# Patient Record
Sex: Female | Born: 1995
Health system: Southern US, Community
[De-identification: ages and names within clinical notes are randomized; demographics above are authoritative.]

## PROBLEM LIST (undated history)

## (undated) DIAGNOSIS — Z9221 Personal history of antineoplastic chemotherapy: Secondary | ICD-10-CM

## (undated) DIAGNOSIS — Z923 Personal history of irradiation: Secondary | ICD-10-CM

## (undated) DIAGNOSIS — C50911 Malignant neoplasm of unspecified site of right female breast: Secondary | ICD-10-CM

## (undated) DIAGNOSIS — D649 Anemia, unspecified: Secondary | ICD-10-CM

## (undated) DIAGNOSIS — D573 Sickle-cell trait: Secondary | ICD-10-CM

## (undated) HISTORY — PX: WISDOM TOOTH EXTRACTION: SHX21

## (undated) HISTORY — PX: BREAST LUMPECTOMY: SHX2

---
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
## 2017-12-22 ENCOUNTER — Emergency Department (HOSPITAL_COMMUNITY)
Admission: EM | Admit: 2017-12-22 | Discharge: 2017-12-22 | Disposition: A | Discharge status: Home / Self Care | Payer: BLUE CROSS/BLUE SHIELD | Attending: Emergency Medicine | Admitting: Emergency Medicine

## 2017-12-22 ENCOUNTER — Encounter (HOSPITAL_COMMUNITY): Payer: Self-pay | Admitting: *Deleted

## 2017-12-22 DIAGNOSIS — N63 Unspecified lump in unspecified breast: Secondary | ICD-10-CM

## 2017-12-22 NOTE — ED Notes (Signed)
Declined W/C at D/C and was escorted to lobby by RN. 

## 2017-12-22 NOTE — ED Provider Notes (Signed)
[facility] EMERGENCY DEPARTMENT Provider Note   CSN: [id number] Arrival date & time: 12/22/17  0747     History   Chief Complaint Chief Complaint  Patient presents with  . Breast Mass    HPI Kim Griffin is a 22 y.o. female.  HPI  [name] is a 22 year old female with no significant past medical history who presents to the emergency department for evaluation of right breast mass.  Patient states that she noticed the mass at the 3 o'clock position 2 days ago.  States that it is tender to the touch.  Reports her pain is 2/10 in severity and described as "sore."  Has tried placing heat over the breast tissue with some relief.  Has never had a breast lump before.  She is not on her menstrual cycle.  Denies fever, chills, night sweats, unexpected weight changes, breast redness, nipple discharge.  She does not have a PCP or OB/GYN.  History reviewed. No pertinent past medical history.  There are no active problems to display for this patient.   History reviewed. No pertinent surgical history.  OB History    No data available       Home Medications    Prior to Admission medications   Not on File    Family History History reviewed. No pertinent family history.  Social History Social History   Tobacco Use  . Smoking status: Not on file  Substance Use Topics  . Alcohol use: Not on file  . Drug use: Not on file     Allergies   Patient has no known allergies.   Review of Systems Review of Systems  Constitutional: Negative for chills, fever and unexpected weight change.  Respiratory: Negative for shortness of breath.   Gastrointestinal: Negative for abdominal pain, nausea and vomiting.  Genitourinary: Negative for vaginal bleeding.  Musculoskeletal: Positive for myalgias (right breast pain).  Skin: Negative for color change and rash.     Physical Exam Updated Vital Signs BP (!) 146/87 (BP Location: Right Arm)   Pulse 91   Temp 98.2  F (36.8 C) (Oral)   Resp 16   LMP 12/18/2017 (Exact Date)   SpO2 95%   Physical Exam  Constitutional: She is oriented to person, place, and time. She appears well-developed and well-nourished. No distress.  HENT:  Head: Normocephalic and atraumatic.  Eyes: Right eye exhibits no discharge. Left eye exhibits no discharge.  Cardiovascular: Normal rate, regular rhythm and intact distal pulses. Exam reveals no friction rub.  No murmur heard. Pulmonary/Chest: Effort normal and breath sounds normal. No stridor. No respiratory distress. She has no wheezes. She has no rales. Right breast exhibits no inverted nipple, no nipple discharge and no skin change. Left breast exhibits no inverted nipple, no nipple discharge and no skin change.  Breasts appear symmetric. Approximately 2cm in diameter, hard, well circumscribed mass at the two o'clock position of the right breast. It is mildly tender to the touch. No erythema, warmth or induration on the overlying skin. No nipple discharge. See picture below.     Abdominal: Soft. Bowel sounds are normal. There is no tenderness. There is no guarding.  Neurological: She is alert and oriented to person, place, and time. Coordination normal.  Skin: Skin is warm and dry. She is not diaphoretic.  Psychiatric: She has a normal mood and affect. Her behavior is normal.  Nursing note and vitals reviewed.    ED Treatments / Results  Labs (all labs ordered are  listed, but only abnormal results are displayed) Labs Reviewed - No data to display  EKG  EKG Interpretation None       Radiology No results found.  Procedures Procedures (including critical care time)  Medications Ordered in ED Medications - No data to display   Initial Impression / Assessment and Plan / ED Course  I have reviewed the triage vital signs and the nursing notes.  Pertinent labs & imaging results that were available during my care of the patient were reviewed by me and  considered in my medical decision making (see chart for details).     Patient presents with right breast mass.  She is afebrile and nontoxic-appearing.  Vital signs stable.  On exam, well circumscribed mass palpated at the 2 o'clock position of the right breast.  It is mildly tender to palpation.  No erythema, warmth or induration on the skin to suggest mastitis or abscess. Differential includes breast cyst, fibroadenoma, malignancy.  Have given patient information to follow-up with [facility] outpatient clinic for further evaluation and workup.  Have counseled patient on use of NSAIDs for pain.  Have discussed return precautions with patient and her grandmother at bedside.  They agree and voiced understanding to the above plan.  Final Clinical Impressions(s) / ED Diagnoses   Final diagnoses:  Breast mass    ED Discharge Orders    None       Bernarda Caffey 12/22/17 0944    Nat Christen, MD 12/23/17 [phone]

## 2017-12-22 NOTE — Discharge Instructions (Signed)
Please schedule an appointment with [facility] outpatient clinic for further evaluation of your breast lump.    I have listed the information below, it is highlighted.  You may take 800 mg ibuprofen every 6 hours as needed for breast pain.  Return to the emergency department if you have redness on the skin, fever greater than 100.4 F or have any new or worsening symptoms.

## 2017-12-22 NOTE — ED Triage Notes (Signed)
Pt in c/o mass to her right breast that she first noted a few days ago, area is tender to touch, denies heat to area or redness, denies nipple discharge, denies history of same

## 2017-12-29 ENCOUNTER — Emergency Department (HOSPITAL_COMMUNITY)
Admission: EM | Admit: 2017-12-29 | Discharge: 2017-12-29 | Disposition: A | Discharge status: Home / Self Care | Payer: BLUE CROSS/BLUE SHIELD | Attending: Emergency Medicine | Admitting: Emergency Medicine

## 2017-12-29 ENCOUNTER — Encounter (HOSPITAL_COMMUNITY): Payer: Self-pay

## 2017-12-29 ENCOUNTER — Other Ambulatory Visit: Payer: Self-pay

## 2017-12-29 DIAGNOSIS — C50911 Malignant neoplasm of unspecified site of right female breast: Secondary | ICD-10-CM

## 2017-12-29 DIAGNOSIS — N6310 Unspecified lump in the right breast, unspecified quadrant: Secondary | ICD-10-CM | POA: Diagnosis not present

## 2017-12-29 DIAGNOSIS — F410 Panic disorder [episodic paroxysmal anxiety] without agoraphobia: Secondary | ICD-10-CM | POA: Diagnosis not present

## 2017-12-29 DIAGNOSIS — N63 Unspecified lump in unspecified breast: Secondary | ICD-10-CM

## 2017-12-29 DIAGNOSIS — R079 Chest pain, unspecified: Secondary | ICD-10-CM | POA: Diagnosis present

## 2017-12-29 DIAGNOSIS — R0789 Other chest pain: Secondary | ICD-10-CM | POA: Diagnosis not present

## 2017-12-29 HISTORY — DX: Malignant neoplasm of unspecified site of right female breast: C50.911

## 2017-12-29 NOTE — Discharge Instructions (Signed)
Return for fever, productive cough, passing out, or any other worsening symptoms.  Otherwise, please keep your appointment with [facility].

## 2017-12-29 NOTE — ED Triage Notes (Signed)
Per EMS- Patient was diagnosed with a right breast cyst and is scheduled for surgery to have the cyst drained next wek. Patient was at work today and reached up to put something on a rack and had increased pain to the right breast area.

## 2017-12-29 NOTE — ED Provider Notes (Signed)
[facility] DEPT Provider Note   CSN: [id number] Arrival date & time: 12/29/17  1656     History   Chief Complaint Chief Complaint  Patient presents with  . Chest Pain  . rt breast cyst    HPI Kim Griffin is a 22 y.o. female.  Patient presents to the ED with a chief complaint of chest pain.  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  She states that she has a breast cyst on her right breast and has an appointment to have this evaluated at [facility] on 3/5 next week.  She states that she has been quite concerned about this because she has a family history of breast cancer.  She states that she noticed the cyst 2 weeks ago.  She states that it has not changed in size.  She denies fevers, cough, redness of skin, or nipple discharge.  Denies any history of PE/DVT.  She states that after having the sharp episode of CP today, she felt flushed, anxious, and numb all over.  She feels better now.   The history is provided by the patient. No language interpreter was used.    Past Medical History:  Diagnosis Date  . Breast cyst, right     There are no active problems to display for this patient.   No past surgical history on file.  OB History    No data available       Home Medications    Prior to Admission medications   Medication Sig Start Date End Date Taking? Authorizing Provider  amoxicillin-clavulanate (AUGMENTIN) 875-125 MG tablet Take 1 tablet by mouth 2 (two) times daily.   Yes [provider]  Ferrous Fumarate (HEMOCYTE - 106 MG FE) 324 (106 Fe) MG TABS tablet Take 1 tablet by mouth daily.   Yes [provider]  ibuprofen (ADVIL,MOTRIN) 800 MG tablet Take 800 mg by mouth every 8 (eight) hours as needed for mild pain or moderate pain.   Yes [provider]    Family History No family history on file.  Social History Social  History   Tobacco Use  . Smoking status: Not on file  Substance Use Topics  . Alcohol use: Not on file  . Drug use: Not on file     Allergies   Patient has no known allergies.   Review of Systems Review of Systems  All other systems reviewed and are negative.    Physical Exam Updated Vital Signs BP 128/84 (BP Location: Right Arm)   Pulse 84   Resp 17   Ht 5' (1.524 m)   Wt 55.3 kg (122 lb)   LMP 12/18/2017 (Exact Date)   SpO2 100%   BMI 23.83 kg/m   Physical Exam  Constitutional: She is oriented to person, place, and time. She appears well-developed and well-nourished.  HENT:  Head: Normocephalic and atraumatic.  Eyes: Conjunctivae and EOM are normal. Pupils are equal, round, and reactive to light.  Neck: Normal range of motion. Neck supple.  Cardiovascular: Normal rate and regular rhythm. Exam reveals no gallop and no friction rub.  No murmur heard. Chaperone present for breast exam 2x2 cm breast nodule located at the 2 o'clock position, without retraction of skin, color change, nipple discharge, or induration, no palpable axillary lymph nodes  Pulmonary/Chest: Effort normal and breath sounds normal. No respiratory distress. She has no wheezes. She has no rales. She exhibits  no tenderness.  CTAB  Abdominal: Soft. Bowel sounds are normal. She exhibits no distension and no mass. There is no tenderness. There is no rebound and no guarding.  Musculoskeletal: Normal range of motion. She exhibits no edema or tenderness.  Neurological: She is alert and oriented to person, place, and time.  Skin: Skin is warm and dry.  Psychiatric: She has a normal mood and affect. Her behavior is normal. Judgment and thought content normal.  Nursing note and vitals reviewed.    ED Treatments / Results  Labs (all labs ordered are listed, but only abnormal results are displayed) Labs Reviewed - No data to display  EKG  EKG Interpretation  Date/Time:  Friday December 29 2017 22:04:58  EST Ventricular Rate:  65 PR Interval:    QRS Duration: 70 QT Interval:  365 QTC Calculation: 380 R Axis:   65 Text Interpretation:  Sinus or ectopic atrial rhythm Confirmed by Julianne Rice [phone] on 12/29/2017 11:12:44 PM       Radiology No results found.  Procedures Procedures (including critical care time)  Medications Ordered in ED Medications - No data to display   Initial Impression / Assessment and Plan / ED Course  I have reviewed the triage vital signs and the nursing notes.  Pertinent labs & imaging results that were available during my care of the patient were reviewed by me and considered in my medical decision making (see chart for details).     Patient with chest pain that was brief and sharp today.  Has a cyst that she is going to have evaluated next week at [facility].  She has no evidence of abscess.  She is afebrile.  There are no overlying skin changes.  I believe that the flushed sensation, anxiety, and numbness, she experienced was likely panic/anxiety attack.  Her VSS.  She is not hypoxic nor tachycardic.  No cough.  Doubt utility of CXR.  I personally measured patient's temp 97.5.   I stressed the importance of keeping her follow-up appointment in 4 days.  Final Clinical Impressions(s) / ED Diagnoses   Final diagnoses:  Panic attack  Breast nodule    ED Discharge Orders    None       Montine Circle, PA-C 12/29/17 2316    Julianne Rice, MD 12/30/17 [phone]

## 2017-12-29 NOTE — ED Notes (Signed)
EKG given to EDP,Yelver[place],MD. For review. 

## 2017-12-29 NOTE — ED Triage Notes (Signed)
Pt is c/o rt sided chest pain that radiates to mid chest pt radiates painas 5/10 and sharp pt reports being SOB. Pt reports that symptoms began over a 1 week and a half

## 2017-12-29 NOTE — ED Notes (Signed)
Bed: [facility]1 Expected date:  Expected time:  Means of arrival:  Comments: 

## 2018-01-02 ENCOUNTER — Ambulatory Visit (INDEPENDENT_AMBULATORY_CARE_PROVIDER_SITE_OTHER): Payer: BLUE CROSS/BLUE SHIELD | Admitting: Obstetrics & Gynecology

## 2018-01-02 ENCOUNTER — Encounter: Payer: Self-pay | Admitting: Obstetrics & Gynecology

## 2018-01-02 VITALS — BP 118/66 | HR 78 | Ht 60.25 in | Wt 125.4 lb

## 2018-01-02 DIAGNOSIS — N6310 Unspecified lump in the right breast, unspecified quadrant: Secondary | ICD-10-CM

## 2018-01-02 DIAGNOSIS — Z0000 Encounter for general adult medical examination without abnormal findings: Secondary | ICD-10-CM

## 2018-01-02 NOTE — Progress Notes (Signed)
Patient ID: Kim Griffin, female   DOB: [birth year], 22 y.o.   MRN: [id number]  No chief complaint on file.   HPI Kim Griffin is a 22 y.o. female. HPI She is here for a new onset right breast mass. She noted it about 2 weeks ago. She was seen by her mom's primary care doctor who felt that there was "inflamation" there and prescribed augmentin for the breast mass. She tells me that a "great great GM" had breast cancer.  Past Medical History:  Diagnosis Date  . Breast cyst, right     No past surgical history on file.  No family history on file.  Social History Social History   Tobacco Use  . Smoking status: Never Smoker  . Smokeless tobacco: Never Used  Substance Use Topics  . Alcohol use: No    Frequency: Never  . Drug use: No    No Known Allergies  Current Outpatient Medications  Medication Sig Dispense Refill  . amoxicillin-clavulanate (AUGMENTIN) 875-125 MG tablet Take 1 tablet by mouth 2 (two) times daily.    . Ferrous Fumarate (HEMOCYTE - 106 MG FE) 324 (106 Fe) MG TABS tablet Take 1 tablet by mouth daily.    Marland Kitchen ibuprofen (ADVIL,MOTRIN) 800 MG tablet Take 800 mg by mouth every 8 (eight) hours as needed for mild pain or moderate pain.     No current facility-administered medications for this visit.     Review of Systems Review of Systems She is a sophomore at [employer] She is a virgin, no boyfriend now.  Blood pressure 118/66, pulse 78, height 5' 0.25" (1.53 m), weight 125 lb 6.4 oz (56.9 kg), last menstrual period 12/18/2017.  Physical Exam Physical Exam Breathing, conversing, and ambulating normally Well nourished, well hydrated Black female, no apparent distress Normal left breast Right breast with 3-4 cm firm minimally mobile breast mass just to the right of the nipple (at the 12-3 o'clock position)  Data Reviewed n/a  Assessment    Right breast mass    Plan    Breast ultrasound  Refer to gen surgery  I was unable to do a pap smear today due  to her very tiny introitus. I will have her schedule another appt for an annual exam in a few months       Idaliz Tinkle C Lis Savitt 01/02/2018, 11:06 AM

## 2018-01-03 ENCOUNTER — Telehealth: Payer: Self-pay | Admitting: *Deleted

## 2018-01-10 ENCOUNTER — Ambulatory Visit
Admission: RE | Admit: 2018-01-10 | Discharge: 2018-01-10 | Disposition: A | Discharge status: Home / Self Care | Payer: BLUE CROSS/BLUE SHIELD | Source: Ambulatory Visit | Attending: Obstetrics & Gynecology | Admitting: Obstetrics & Gynecology

## 2018-01-10 ENCOUNTER — Other Ambulatory Visit: Payer: Self-pay | Admitting: Obstetrics & Gynecology

## 2018-01-10 ENCOUNTER — Other Ambulatory Visit: Payer: Self-pay | Admitting: *Deleted

## 2018-01-10 DIAGNOSIS — N6310 Unspecified lump in the right breast, unspecified quadrant: Secondary | ICD-10-CM

## 2018-01-10 DIAGNOSIS — R599 Enlarged lymph nodes, unspecified: Secondary | ICD-10-CM

## 2018-01-11 ENCOUNTER — Telehealth: Payer: Self-pay | Admitting: Hematology and Oncology

## 2018-01-11 ENCOUNTER — Encounter: Payer: Self-pay | Admitting: *Deleted

## 2018-01-11 NOTE — Telephone Encounter (Signed)
Spoke to patient to confirm morning [facility] appointment for 01/17/18, will email packet to the patient

## 2018-01-12 ENCOUNTER — Other Ambulatory Visit: Payer: BLUE CROSS/BLUE SHIELD

## 2018-01-16 ENCOUNTER — Other Ambulatory Visit: Payer: Self-pay | Admitting: *Deleted

## 2018-01-16 DIAGNOSIS — C50211 Malignant neoplasm of upper-inner quadrant of right female breast: Secondary | ICD-10-CM

## 2018-01-16 DIAGNOSIS — Z170 Estrogen receptor positive status [ER+]: Principal | ICD-10-CM

## 2018-01-17 ENCOUNTER — Encounter: Payer: Self-pay | Admitting: Hematology and Oncology

## 2018-01-17 ENCOUNTER — Inpatient Hospital Stay: Payer: BLUE CROSS/BLUE SHIELD

## 2018-01-17 ENCOUNTER — Ambulatory Visit
Admission: RE | Admit: 2018-01-17 | Discharge: 2018-01-17 | Disposition: A | Discharge status: Home / Self Care | Payer: BLUE CROSS/BLUE SHIELD | Source: Ambulatory Visit | Attending: Radiation Oncology | Admitting: Radiation Oncology

## 2018-01-17 ENCOUNTER — Other Ambulatory Visit: Payer: Self-pay | Admitting: *Deleted

## 2018-01-17 ENCOUNTER — Telehealth: Payer: Self-pay | Admitting: Hematology and Oncology

## 2018-01-17 ENCOUNTER — Inpatient Hospital Stay: Payer: BLUE CROSS/BLUE SHIELD | Attending: Hematology and Oncology | Admitting: Hematology and Oncology

## 2018-01-17 ENCOUNTER — Ambulatory Visit: Payer: Self-pay | Admitting: Surgery

## 2018-01-17 ENCOUNTER — Ambulatory Visit: Payer: BLUE CROSS/BLUE SHIELD | Attending: Surgery | Admitting: Physical Therapy

## 2018-01-17 ENCOUNTER — Encounter: Payer: Self-pay | Admitting: Physical Therapy

## 2018-01-17 ENCOUNTER — Other Ambulatory Visit: Payer: Self-pay

## 2018-01-17 ENCOUNTER — Encounter: Payer: Self-pay | Admitting: *Deleted

## 2018-01-17 DIAGNOSIS — Z170 Estrogen receptor positive status [ER+]: Principal | ICD-10-CM

## 2018-01-17 DIAGNOSIS — C50211 Malignant neoplasm of upper-inner quadrant of right female breast: Secondary | ICD-10-CM | POA: Insufficient documentation

## 2018-01-17 DIAGNOSIS — Z803 Family history of malignant neoplasm of breast: Secondary | ICD-10-CM

## 2018-01-17 LAB — CMP (CANCER CENTER ONLY)
ALT: 14 U/L (ref 0–55)
AST: 15 U/L (ref 5–34)
Albumin: 3.9 g/dL (ref 3.5–5.0)
Alkaline Phosphatase: 70 U/L (ref 40–150)
Anion gap: 10 (ref 3–11)
BUN: 11 mg/dL (ref 7–26)
CHLORIDE: 103 mmol/L (ref 98–109)
CO2: 28 mmol/L (ref 22–29)
CREATININE: 0.81 mg/dL (ref 0.60–1.10)
Calcium: 9.8 mg/dL (ref 8.4–10.4)
GFR, Est AFR Am: >60 mL/min (ref >60)
GFR, Estimated: >60 mL/min (ref >60)
Glucose, Bld: 102 mg/dL (ref 70–140)
Potassium: 4.2 mmol/L (ref 3.5–5.1)
SODIUM: 141 mmol/L (ref 136–145)
Total Bilirubin: 0.2 mg/dL (ref 0.2–1.2)
Total Protein: 8.1 g/dL (ref 6.4–8.3)

## 2018-01-17 LAB — CBC WITH DIFFERENTIAL (CANCER CENTER ONLY)
Basophils Absolute: 0 10*3/uL (ref 0.0–0.1)
Basophils Relative: 1 %
EOS ABS: 0.1 10*3/uL (ref 0.0–0.5)
EOS PCT: 2 %
HCT: 35.8 % (ref 34.8–46.6)
HEMOGLOBIN: 11.6 g/dL (ref 11.6–15.9)
LYMPHS ABS: 1.6 10*3/uL (ref 0.9–3.3)
LYMPHS PCT: 37 %
MCH: 21.7 pg — AB (ref 25.1–34.0)
MCHC: 32.4 g/dL (ref 31.5–36.0)
MCV: 67.1 fL — AB (ref 79.5–101.0)
MONOS PCT: 9 %
Monocytes Absolute: 0.4 10*3/uL (ref 0.1–0.9)
Neutro Abs: 2.2 10*3/uL (ref 1.5–6.5)
Neutrophils Relative %: 51 %
PLATELETS: 306 10*3/uL (ref 145–400)
RBC: 5.34 MIL/uL (ref 3.70–5.45)
RDW: 16 % — ABNORMAL HIGH (ref 11.2–14.5)
WBC Count: 4.3 10*3/uL (ref 3.9–10.3)

## 2018-01-17 NOTE — Progress Notes (Signed)
Clinical Social Work [facility] Psychosocial Distress Screening [place]  Patient completed distress screening protocol and scored a 7 on the Psychosocial Distress Thermometer which indicates moderate distress. Clinical Social Worker met with patient and patients family in [facility] to assess for distress and other psychosocial needs. Patient stated she was feeling overwhelmed but felt "better" after meeting with the treatment team and getting more information on her treatment plan. CSW and patient discussed common feeling and emotions when being diagnosed with cancer, and the importance of support during treatment. CSW informed patient of the support team and support services at [facility].  CSW will follow up with patient at her first treatment to review resources and offer additional support.  CSW provided contact information and encouraged patient to call with any questions or concerns.  ONCBCN DISTRESS SCREENING 01/17/2018  Screening Type Initial Screening  Distress experienced in past week (1-10) 7  Emotional problem type Nervousness/Anxiety;Adjusting to illness  Physical Problem type Pain;Breathing  Physician notified of physical symptoms Yes     Johnnye Lana, MSW, LCSW, OSW-C Clinical Social Worker [facility] [phone]

## 2018-01-17 NOTE — Progress Notes (Signed)
[facility] CONSULT NOTE  Patient Care Team: Patient, No Pcp Per as PCP - General (General Practice) Nicholas Lose, MD as Consulting Physician (Hematology and Oncology) Alphonsa Overall, MD as Consulting Physician (General Surgery) Gery Pray, MD as Consulting Physician (Radiation Oncology)  CHIEF COMPLAINTS/PURPOSE OF CONSULTATION:  Newly diagnosed breast cancer  HISTORY OF PRESENTING ILLNESS:  Kim Griffin 22 y.o. female is here because of recent diagnosis of  Rt breast Cancer.  Patient felt a lump in the right breast and immediately went to the emergency room.  They thought this must have been a cyst.  She then subsequently underwent a mammogram that revealed a 2.6 cm tumor in the medial right breast with one abnormal lymph node.  Ultrasound-guided biopsy of the mass and the lymph node revealed grade 3 invasive ductal carcinoma that was ER 90% PR 2% Ki-67 70% HER-2 negative.  She was presented this morning in the multidisciplinary tumor board and she is here today accompanied by her family to discuss her treatment plan. Patient is currently working at [employer] and has not been married and does not have any children.  I reviewed her records extensively and collaborated the history with the patient.  SUMMARY OF ONCOLOGIC HISTORY:   Malignant neoplasm of upper-inner quadrant of right breast in female, estrogen receptor positive ([place])   01/10/2018 Initial Diagnosis    Palpable right breast mass with calcifications medial right breast at 1 o'clock position: 2.6 cm by ultrasound, 1 abnormal lymph node: Ultrasound biopsy of both the mass and the lymph node revealed grade 3 invasive ductal carcinoma ER 90%, PR 2%, Ki-67 70%, HER-2 negative ratio 0.96, T2 N1 stage II a AJCC 8 clinical stage      MEDICAL HISTORY:  Past Medical History:  Diagnosis Date  . Breast cyst, right     SURGICAL HISTORY: History reviewed. No pertinent surgical history.  SOCIAL HISTORY: Social  History   Socioeconomic History  . Marital status: Single    Spouse name: Not on file  . Number of children: Not on file  . Years of education: Not on file  . Highest education level: Not on file  Social Needs  . Financial resource strain: Not on file  . Food insecurity - worry: Not on file  . Food insecurity - inability: Not on file  . Transportation needs - medical: Not on file  . Transportation needs - non-medical: Not on file  Occupational History  . Not on file  Tobacco Use  . Smoking status: Never Smoker  . Smokeless tobacco: Never Used  Substance and Sexual Activity  . Alcohol use: No    Frequency: Never  . Drug use: No  . Sexual activity: Not on file  Other Topics Concern  . Not on file  Social History Narrative  . Not on file    FAMILY HISTORY: Family History  Problem Relation Age of Onset  . Breast cancer Cousin   . Lung cancer Maternal Uncle   . Lung cancer Maternal Uncle     ALLERGIES:  has No Known Allergies.  MEDICATIONS:  Current Outpatient Medications  Medication Sig Dispense Refill  . Ferrous Fumarate (HEMOCYTE - 106 MG FE) 324 (106 Fe) MG TABS tablet Take 1 tablet by mouth daily.    Marland Kitchen ibuprofen (ADVIL,MOTRIN) 800 MG tablet Take 800 mg by mouth every 8 (eight) hours as needed for mild pain or moderate pain.    Marland Kitchen amoxicillin-clavulanate (AUGMENTIN) 875-125 MG tablet Take 1 tablet by mouth 2 (  two) times daily.     No current facility-administered medications for this visit.     REVIEW OF SYSTEMS:   Constitutional: Denies fevers, chills or abnormal night sweats Eyes: Denies blurriness of vision, double vision or watery eyes Ears, nose, mouth, throat, and face: Denies mucositis or sore throat Respiratory: Denies cough, dyspnea or wheezes Cardiovascular: Denies palpitation, chest discomfort or lower extremity swelling Gastrointestinal:  Denies nausea, heartburn or change in bowel habits Skin: Denies abnormal skin rashes Lymphatics: Denies new  lymphadenopathy or easy bruising Neurological:Denies numbness, tingling or new weaknesses Behavioral/Psych: Mood is stable, no new changes  Breast: Lump is palpable in the right breast All other systems were reviewed with the patient and are negative.  PHYSICAL EXAMINATION: ECOG PERFORMANCE STATUS: 1 - Symptomatic but completely ambulatory  Vitals:   01/17/18 0910  BP: 113/74  Pulse: 84  Resp: 18  Temp: 98.3 F (36.8 C)  SpO2: 100%   Filed Weights   01/17/18 0910  Weight: 125 lb 4.8 oz (56.8 kg)    GENERAL:alert, no distress and comfortable SKIN: skin color, texture, turgor are normal, no rashes or significant lesions EYES: normal, conjunctiva are pink and non-injected, sclera clear OROPHARYNX:no exudate, no erythema and lips, buccal mucosa, and tongue normal  NECK: supple, thyroid normal size, non-tender, without nodularity LYMPH:  no palpable lymphadenopathy in the cervical, axillary or inguinal LUNGS: clear to auscultation and percussion with normal breathing effort HEART: regular rate & rhythm and no murmurs and no lower extremity edema ABDOMEN:abdomen soft, non-tender and normal bowel sounds Musculoskeletal:no cyanosis of digits and no clubbing  PSYCH: alert & oriented x 3 with fluent speech NEURO: no focal motor/sensory deficits BREAST: Palpable lump in the right breast. No palpable axillary or supraclavicular lymphadenopathy (exam performed in the presence of a chaperone)   LABORATORY DATA:  I have reviewed the data as listed Lab Results  Component Value Date   WBC 4.3 01/17/2018   HGB 13.1 01/13/2011   HCT 35.8 01/17/2018   MCV 67.1 (L) 01/17/2018   PLT 306 01/17/2018   Lab Results  Component Value Date   NA 141 01/17/2018   K 4.2 01/17/2018   CL 103 01/17/2018   CO2 28 01/17/2018    RADIOGRAPHIC STUDIES: I have personally reviewed the radiological reports and agreed with the findings in the report.  ASSESSMENT AND PLAN:  Malignant neoplasm of  upper-inner quadrant of right breast in female, estrogen receptor positive ([place]) 01/10/2018:Palpable right breast mass with calcifications medial right breast at 1 o'clock position: 2.6 cm by ultrasound, 1 abnormal lymph node: Ultrasound biopsy of both the mass and the lymph node revealed grade 3 invasive ductal carcinoma ER 90%, PR 2%, Ki-67 70%, HER-2 negative ratio 0.96, T2 N1 stage II a AJCC 8 clinical stage  Pathology and radiology counseling: Discussed with the patient, the details of pathology including the type of breast cancer,the clinical staging, the significance of ER, PR and HER-2/neu receptors and the implications for treatment. After reviewing the pathology in detail, we proceeded to discuss the different treatment options between surgery, radiation, chemotherapy, antiestrogen therapies.  Recommendation: 1 Genetic testing.  Staging scans 2. neoadjuvant chemotherapy with dose dense Adriamycin and Cytoxan followed by Taxol weekly x12 3.  Followed by surgery depending on genetic test results and patient's preference 4.  Followed by adjuvant radiation 5.  Followed by antiestrogen therapy and evaluation on Natalee clinical trial  Fertility preservation will be arranged through [facility]. I will discussed with the fertility clinic  whether we can initiate tamoxifen therapy along with Zoladex while she is undergoing fertility preservation.  Return to clinic to start neoadjuvant chemotherapy  All questions were answered. The patient knows to call the clinic with any problems, questions or concerns.    Harriette Ohara, MD 01/17/18

## 2018-01-17 NOTE — Telephone Encounter (Signed)
Per 3/20 already done

## 2018-01-17 NOTE — Progress Notes (Signed)
Nutrition Assessment  Reason for Assessment:  Pt seen in Breast Clinic  ASSESSMENT:   22 year old female with new diagnosis of breast cancer.    Patient reports normal appetite.    Medications:  reviewed  Labs: reviewed  Anthropometrics:   Height: 60.25 inches Weight: 125 lb 6.4 oz BMI: 24   NUTRITION DIAGNOSIS: Food and nutrition related knowledge deficit related to new diagnosis of breast cancer as evidenced by no prior need for nutrition related information.  INTERVENTION:   Discussed and provided packet of information regarding nutritional tips for breast cancer patients.  Questions answered.  Teachback method used.  Contact information provided and patient knows to contact me with questions/concerns.    MONITORING, EVALUATION, and GOAL: Pt will consume a healthy plant based diet to maintain lean body mass throughout treatment.   Merilee Wible B. Zenia Resides, [place], [place] Registered Dietitian [phone] (pager)

## 2018-01-17 NOTE — Patient Instructions (Signed)

## 2018-01-17 NOTE — Therapy (Signed)
[place], [place], [address] Phone: [phone]   Fax:  [phone]  Physical Therapy Evaluation  Patient Details  Name: Kim Griffin MRN: [id number] Date of Birth: [birth year] Referring Provider: Dr. Alphonsa Overall   Encounter Date: 01/17/2018  PT End of Session - 01/17/18 1110    Visit Number  1    Number of Visits  1    PT Start Time  1000    PT Stop Time  1024    PT Time Calculation (min)  24 min    Activity Tolerance  Patient tolerated treatment well    Behavior During Therapy  [facility] for tasks assessed/performed       Past Medical History:  Diagnosis Date  . Breast cyst, right     History reviewed. No pertinent surgical history.  There were no vitals filed for this visit.   Subjective Assessment - 01/17/18 1048    Subjective  Patient reports she is here to be seen by her medical team for her newly diagnosed right breast cancer.    Patient is accompained by:  Family member    Pertinent History  Patient was diagnosed on 01/11/18 with right invasive ductal carcinoma breast cancer. It measures 2.6 cm and is located in the upper inner quadrant. It is ER/PR positive and HER2 negative with a Ki67 of 70%. She has a known axillary positive lymph node.    Patient Stated Goals  Reduce lymphedema risk and learn post op shoulder ROM HEP    Currently in Pain?  Yes    Pain Score  2     Pain Location  Breast    Pain Orientation  Right    Pain Descriptors / Indicators  Aching    Pain Type  Acute pain    Pain Onset  In the past 7 days    Pain Frequency  Intermittent    Aggravating Factors   Nothing    Pain Relieving Factors  Nothing    Multiple Pain Sites  No         [facility] PT Assessment - 01/17/18 0001      Assessment   Medical Diagnosis  Right breast cancer    Referring Provider  Dr. Alphonsa Overall    Onset Date/Surgical Date  01/11/18    Hand Dominance  Right    Prior Therapy  none      Precautions   Precautions  Other (comment)    Precaution Comments  active cancer      Restrictions   Weight Bearing Restrictions  No      Balance Screen   Has the patient fallen in the past 6 months  No    Has the patient had a decrease in activity level because of a fear of falling?   No    Is the patient reluctant to leave their home because of a fear of falling?   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    Leisure  She does not exercise      Cognition   Overall Cognitive Status  Within Functional Limits for tasks assessed      Posture/Postural  Control   Posture/Postural Control  No significant limitations      ROM / Strength   AROM / PROM / Strength  AROM;Strength      AROM   AROM Assessment Site  Shoulder;Cervical    Right/Left Shoulder  Right;Left    Right Shoulder Extension  35 Degrees    Right Shoulder Flexion  155 Degrees    Right Shoulder ABduction  157 Degrees    Right Shoulder Internal Rotation  60 Degrees    Right Shoulder External Rotation  83 Degrees    Left Shoulder Extension  46 Degrees    Left Shoulder Flexion  152 Degrees    Left Shoulder ABduction  170 Degrees    Left Shoulder Internal Rotation  69 Degrees    Left Shoulder External Rotation  85 Degrees    Cervical Flexion  WNL    Cervical Extension  WNL    Cervical - Right Side Bend  WNL    Cervical - Left Side Bend  WNL    Cervical - Right Rotation  WNL    Cervical - Left Rotation  WNL      Strength   Overall Strength  Within functional limits for tasks performed        LYMPHEDEMA/ONCOLOGY QUESTIONNAIRE - 01/17/18 1107      Type   Cancer Type  Right breast      Lymphedema Assessments   Lymphedema Assessments  Upper extremities       Right Upper Extremity Lymphedema   10 cm Proximal to Olecranon Process  26.6 cm    Olecranon Process  24 cm    10 cm Proximal to Ulnar Styloid Process  20.9 cm    Just Proximal to Ulnar Styloid Process  14.7 cm    Across Hand at [employer]  17.9 cm    At [place] of 2nd Digit  5.8 cm      Left Upper Extremity Lymphedema   10 cm Proximal to Olecranon Process  26.3 cm    Olecranon Process  23.4 cm    10 cm Proximal to Ulnar Styloid Process  20.3 cm    Just Proximal to Ulnar Styloid Process  14.8 cm    Across Hand at [employer]  17.5 cm    At [place] of 2nd Digit  5.7 cm          Objective measurements completed on examination: See above findings.    Patient was instructed today in a home exercise program today for post op shoulder range of motion. These included active assist shoulder flexion in sitting, scapular retraction, wall walking with shoulder abduction, and hands behind head external rotation.  She was encouraged to do these twice a day, holding 3 seconds and repeating 5 times when permitted by her physician.      PT Education - 01/17/18 1108    Education provided  Yes    Education Details  Lymphedema risk reduction and post op shoulder ROM HEP    Person(s) Educated  Patient;Parent(s)    Methods  Explanation;Demonstration;Handout    Comprehension  Returned demonstration;Verbalized understanding           Breast Clinic Goals - 01/17/18 1118      Patient will be able to verbalize understanding of pertinent lymphedema risk reduction practices relevant to her diagnosis specifically related to skin care.   Time  1    Period  Days    Status  Achieved      Patient  will be able to return demonstrate and/or verbalize understanding of the post-op home exercise program related to regaining shoulder range of motion.   Time  1    Period  Days    Status  Achieved      Patient will be able to verbalize understanding of the importance of attending the postoperative  After Breast Cancer Class for further lymphedema risk reduction education and therapeutic exercise.   Time  1    Period  Days    Status  Achieved            Plan - 01/17/18 1111    Clinical Impression Statement  Patient was diagnosed on 01/11/18 with right invasive ductal carcinoma breast cancer. It measures 2.6 cm and is located in the upper inner quadrant. It is ER/PR positive and HER2 negative with a Ki67 of 70%. She has a known axillary positive lymph node. Her multidisciplinary medical team met prior to her assessments to determine a recommended treatment plan. She is planning to have an MRI and genetic testing, neoadjuvant chemotherapy, and then probably a right lumpectomy and targeted axillary node dissection followed by radiation and anti-estrogen therapy. She will benefit from a post op PT visit to reassess and determine needs.    History and Personal Factors relevant to plan of care:  Young age (74), lives with her aunt    Clinical Presentation  Evolving    Clinical Presentation due to:  Unknown extent of disease until staging scans are complete; surgical plan unknown until response to chemo is known; aggressive disease    Clinical Decision Making  Moderate    Rehab Potential  Good    Clinical Impairments Affecting Rehab Potential  Unknown extent of disease    PT Frequency  One time visit    PT Treatment/Interventions  ADLs/Self Care Home Management;Therapeutic exercise;Patient/family education    PT Next Visit Plan  Will f/u after surgery    PT Home Exercise Plan  Post op shoulder ROM HEP    Consulted and Agree with Plan of Care  Patient;Family member/caregiver    Family Member Consulted  Parents and aunt       Patient will benefit from skilled therapeutic intervention in order to improve the following deficits and impairments:  Pain, Decreased knowledge of precautions, Impaired UE functional use, Decreased range of motion  Visit Diagnosis: Malignant neoplasm of upper-inner  quadrant of right breast in female, estrogen receptor positive ([place]) - Plan: PT plan of care cert/re-cert     Problem List Patient Active Problem List   Diagnosis Date Noted  . Malignant neoplasm of upper-inner quadrant of right breast in female, estrogen receptor positive ([place]) 01/16/2018    Annia Friendly, PT 01/17/18 11:21 AM  [place], [place], [address] Phone: [phone]   Fax:  [phone]  Name: LAURENASHLEY VIAR MRN: [id number] Date of Birth: 1996/07/24

## 2018-01-17 NOTE — Progress Notes (Addendum)
Radiation Oncology         [phone] ________________________________  Name: Kim Griffin        MRN: [id number]  Date of Service: 01/17/2018 DOB: 03/30/1996  [id number], No Pcp Per  Alphonsa Overall, MD     REFERRING PHYSICIAN: Alphonsa Overall, MD   DIAGNOSIS: The encounter diagnosis was Malignant neoplasm of upper-inner quadrant of right breast in female, estrogen receptor positive ([place]).   HISTORY OF PRESENT ILLNESS: Kim Griffin is a 22 y.o. female seen in the multidisciplinary breast clinic for a new diagnosis of right breast cancer. The patient presented to the ER on 01/10/18 after palpating a mass in the right breast. Ultrasound of the right breast on 01/10/18 showed an irregular mass within the right breast at the 1:00 position, 1 cm from the nipple, measuring 2.6 cm. This corresponded with the palpable lump. There was a single morphologically abnormal lymph node in the right axilla with cortical thickening measuring 6 mm. Biopsy of the mass on 01/10/18 showed grade III invasive ductal carcinoma with focal necrosis of the right breast at the upper inner quadrant at the 1 o'clock position. Receptor status was ER 90%, PR 2%, Ki67 70%, and Her2 negative. 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    PREVIOUS RADIATION THERAPY: No   PAST MEDICAL HISTORY:  Past Medical History:  Diagnosis Date  . Breast cyst, right        PAST SURGICAL HISTORY:No past surgical history on file.   FAMILY HISTORY:  Family History  Problem Relation Age of Onset  . Breast cancer Cousin   . Lung cancer Maternal Uncle   . Lung cancer Maternal Uncle      SOCIAL HISTORY:  reports that  has never smoked. she has never used smokeless tobacco. She reports that she does not drink alcohol or use drugs. The patient is single and lives in [place]. 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 She's accompanied by her aunt and parents.   ALLERGIES: Patient has no known  allergies.   MEDICATIONS:  Current Outpatient Medications  Medication Sig Dispense Refill  . amoxicillin-clavulanate (AUGMENTIN) 875-125 MG tablet Take 1 tablet by mouth 2 (two) times daily.    . Ferrous Fumarate (HEMOCYTE - 106 MG FE) 324 (106 Fe) MG TABS tablet Take 1 tablet by mouth daily.    Marland Kitchen ibuprofen (ADVIL,MOTRIN) 800 MG tablet Take 800 mg by mouth every 8 (eight) hours as needed for mild pain or moderate pain.     No current facility-administered medications for this encounter.      REVIEW OF SYSTEMS: On review of systems, the patient reports that she is doing well overall. She reports pain to the breast and reports that she has been having a hard time sleeping while lying flat. She denies any chest pain, anxiety, and anemia. She denies any chest pain, shortness of breath, cough, fevers, chills, night sweats, unintended weight changes. She denies any bowel or bladder disturbances, and denies abdominal pain, nausea or vomiting. She reports normal menstrual cycles. She denies any new musculoskeletal or joint aches or pains. A complete review of systems is obtained and is otherwise negative.     PHYSICAL EXAM:  Wt Readings from Last 3 Encounters:  01/17/18 125 lb 4.8 oz (56.8 kg)  01/02/18 125 lb 6.4 oz (56.9 kg)  12/29/17 122 lb (55.3 kg)   Temp Readings from Last 3 Encounters:  01/17/18 98.3 F (36.8 C) (Oral)  12/29/17 Marland Kitchen)  97.5 F (36.4 C) (Oral)  12/22/17 98.2 F (36.8 C) (Oral)   BP Readings from Last 3 Encounters:  01/17/18 113/74  01/02/18 118/66  12/29/17 115/60   Pulse Readings from Last 3 Encounters:  01/17/18 84  01/02/18 78  12/29/17 84     In general this is a well appearing African American female in no acute distress. She is alert and oriented x4 and appropriate throughout the examination. HEENT reveals that the patient is normocephalic, atraumatic. EOMs are intact. PERRLA. Skin is intact without any evidence of gross lesions. Cardiovascular exam  reveals a regular rate and rhythm, no clicks rubs or murmurs are auscultated. Chest is clear to auscultation bilaterally. Lymphatic assessment is performed and does not reveal any adenopathy in the cervical, supraclavicular, axillary, or inguinal chains. Bilateral breast exam is performed and reveals dense breast tissue of bilateral breasts, and post biopsy bandages in the right lateral breast and right axilla. The mass that the patient noted which was biopsied is firm and palpable and measures up to 4-5 cm in greatest dimension possibly related to bruising. No nipple bleeding or discharge is noted of either breast. Abdomen has active bowel sounds in all quadrants and is intact. The abdomen is soft, non tender, non distended. Lower extremities are negative for pretibial pitting edema, deep calf tenderness, cyanosis or clubbing.   ECOG = 1  0 - Asymptomatic (Fully active, able to carry on all predisease activities without restriction)  1 - Symptomatic but completely ambulatory (Restricted in physically strenuous activity but ambulatory and able to carry out work of a light or sedentary nature. For example, light housework, office work)  2 - Symptomatic, <50% in bed during the day (Ambulatory and capable of all self care but unable to carry out any work activities. Up and about more than 50% of waking hours)  3 - Symptomatic, >50% in bed, but not bedbound (Capable of only limited self-care, confined to bed or chair 50% or more of waking hours)  4 - Bedbound (Completely disabled. Cannot carry on any self-care. Totally confined to bed or chair)  5 - Death   Eustace Pen MM, Creech RH, Tormey DC, et al. [phone]). "Toxicity and response criteria of the [facility]". [place] Oncol. 5 (6): 649-55    LABORATORY DATA:  Lab Results  Component Value Date   WBC 4.3 01/17/2018   HGB 13.1 01/13/2011   HCT 35.8 01/17/2018   MCV 67.1 (L) 01/17/2018   PLT 306 01/17/2018   Lab Results    Component Value Date   NA 141 01/17/2018   K 4.2 01/17/2018   CL 103 01/17/2018   CO2 28 01/17/2018   Lab Results  Component Value Date   ALT 14 01/17/2018   AST 15 01/17/2018   ALKPHOS 70 01/17/2018   BILITOT 0.2 01/17/2018      RADIOGRAPHY: US Breast Ltd Uni Right Inc Axilla  Result Date: 01/10/2018 CLINICAL DATA:  22 year old female with a right breast mass. EXAM: DIGITAL DIAGNOSTIC BILATERAL MAMMOGRAM WITH TOMO ULTRASOUND RIGHT BREAST COMPARISON:  None ACR Breast Density Category d: The breast tissue is extremely dense, which lowers the sensitivity of mammography. FINDINGS: Targeted ultrasound is performed, evaluating the upper inner quadrant of the right breast as directed by the patient, showing an irregular hypoechoic mass at the 1 o'clock axis, 1 cm from the nipple, measuring 2.6 x 2.1 x 2.4 cm with associated calcifications centrally, with internal vascularity, corresponding to the palpable lump. Right axilla was  evaluated with ultrasound showing a single morphologically abnormal lymph node with cortical thickness of 6 mm. Bilateral diagnostic mammogram was then obtained to exclude multifocal or contralateral abnormality. Corresponding irregular mass is identified within the inner right breast. No additional dominant masses, suspicious calcifications or secondary signs of malignancy are identified within the right breast. There are no dominant masses, suspicious calcifications or secondary signs of malignancy identified within the left breast. IMPRESSION: 1. Irregular mass within the right breast at the 1 o'clock axis, 1 cm from the nipple, measuring 2.6 cm, corresponding to the palpable lump. This is a suspicious finding for which ultrasound-guided biopsy is recommended. 2. Single morphologically abnormal lymph node in the right axilla, with cortical thickness measuring 6 mm. This is also a suspicious finding for which ultrasound-guided biopsy is recommended. 3. No mammographic evidence  of malignancy within the left breast. RECOMMENDATION: 1. Ultrasound-guided biopsy of the right breast mass at the 1 o'clock axis. 2. Ultrasound-guided biopsy of the morphologically abnormal lymph node in the right axilla. Ultrasound-guided biopsies are scheduled for later today. I have discussed the findings and recommendations with the patient. Results were also provided in writing at the conclusion of the visit. If applicable, a reminder letter will be sent to the patient regarding the next appointment. BI-RADS CATEGORY  4: Suspicious. Electronically Signed   By: Franki Cabot M.D.   On: 01/10/2018 11:08   Mm Diag Breast Tomo Bilateral  Result Date: 01/10/2018 CLINICAL DATA:  22 year old female with a right breast mass. EXAM: DIGITAL DIAGNOSTIC BILATERAL MAMMOGRAM WITH TOMO ULTRASOUND RIGHT BREAST COMPARISON:  None ACR Breast Density Category d: The breast tissue is extremely dense, which lowers the sensitivity of mammography. FINDINGS: Targeted ultrasound is performed, evaluating the upper inner quadrant of the right breast as directed by the patient, showing an irregular hypoechoic mass at the 1 o'clock axis, 1 cm from the nipple, measuring 2.6 x 2.1 x 2.4 cm with associated calcifications centrally, with internal vascularity, corresponding to the palpable lump. Right axilla was evaluated with ultrasound showing a single morphologically abnormal lymph node with cortical thickness of 6 mm. Bilateral diagnostic mammogram was then obtained to exclude multifocal or contralateral abnormality. Corresponding irregular mass is identified within the inner right breast. No additional dominant masses, suspicious calcifications or secondary signs of malignancy are identified within the right breast. There are no dominant masses, suspicious calcifications or secondary signs of malignancy identified within the left breast. IMPRESSION: 1. Irregular mass within the right breast at the 1 o'clock axis, 1 cm from the nipple,  measuring 2.6 cm, corresponding to the palpable lump. This is a suspicious finding for which ultrasound-guided biopsy is recommended. 2. Single morphologically abnormal lymph node in the right axilla, with cortical thickness measuring 6 mm. This is also a suspicious finding for which ultrasound-guided biopsy is recommended. 3. No mammographic evidence of malignancy within the left breast. RECOMMENDATION: 1. Ultrasound-guided biopsy of the right breast mass at the 1 o'clock axis. 2. Ultrasound-guided biopsy of the morphologically abnormal lymph node in the right axilla. Ultrasound-guided biopsies are scheduled for later today. I have discussed the findings and recommendations with the patient. Results were also provided in writing at the conclusion of the visit. If applicable, a reminder letter will be sent to the patient regarding the next appointment. BI-RADS CATEGORY  4: Suspicious. Electronically Signed   By: Franki Cabot M.D.   On: 01/10/2018 11:08   [place] Axillary Node Core Biopsy Right  Addendum Date: 01/11/2018   ADDENDUM REPORT: 01/11/2018  13:03 ADDENDUM: Pathology revealed GRADE III INVASIVE DUCTAL CARCINOMA WITH FOCAL NECROSIS of the Right breast, upper inner, 1 o'clock position. METASTATIC CARCINOMA of the Right axillary lymph node. This was found to be concordant by Dr. Hassan Rowan. Pathology results were discussed with the patient by telephone. The patient reported doing well after the biopsies with tenderness at the sites. Post biopsy instructions and care were reviewed and questions were answered. The patient was encouraged to call [facility] for any additional concerns. The patient was referred to [facility] on January 17, 2018. Recommendation for bilateral breast MRI due to extreme density. Pathology results reported by Terie Purser, RN on 01/11/2018. Electronically Signed   By: Margarette Canada M.D.    On: 01/11/2018 13:03   Result Date: 01/11/2018 CLINICAL DATA:  22 year old female for tissue sampling of suspicious RIGHT breast mass and enlarged RIGHT axillary lymph node. EXAM: ULTRASOUND GUIDED RIGHT BREAST CORE NEEDLE BIOPSY ULTRASOUND-GUIDED BIOPSY OF A RIGHT AXILLARY LYMPH NODE COMPARISON:  Previous exam(s). FINDINGS: I met with the patient and we discussed the procedure of ultrasound-guided biopsies, including benefits and alternatives. We discussed the high likelihood of a successful procedure. We discussed the risks of the procedures, including infection, bleeding, tissue injury, clip migration, and inadequate sampling. Informed written consent was given. The usual time-out protocol was performed immediately prior to the procedures. RIGHT BREAST BIOPSY: Lesion quadrant: UPPER INNER Using sterile technique and 1% Lidocaine as local anesthetic, under direct ultrasound visualization, a 12 gauge spring-loaded device was used to perform biopsy of the 2.6 cm hypoechoic mass at the 1 o'clock position 1 cm from the nipple using a LATERAL approach. At the conclusion of the procedure a spiral HydroMARK tissue marker clip was deployed into the biopsy cavity with placement confirmed sonographically. RIGHT AXILLARY LYMPH NODE BIOPSY: Using sterile technique and 1% Lidocaine as local anesthetic, under direct ultrasound visualization, a 14 gauge spring-loaded device was used to perform biopsy of the enlarged RIGHT axillary lymph node using a LATERAL approach. At the conclusion of the procedure a spiral HydroMARK tissue marker clip was deployed into the biopsy cavity with placement confirmed sonographically. IMPRESSION: Ultrasound guided biopsy of 2.6 cm UPPER INNER RIGHT breast mass and enlarged RIGHT axillary lymph node. No apparent complications. Electronically Signed: By: Margarette Canada M.D. On: 01/10/2018 14:29   [place] Rt Breast Bx W Loc Dev 1st Lesion Img Bx Spec US Guide  Addendum Date: 01/11/2018   ADDENDUM  REPORT: 01/11/2018 13:03 ADDENDUM: Pathology revealed GRADE III INVASIVE DUCTAL CARCINOMA WITH FOCAL NECROSIS of the Right breast, upper inner, 1 o'clock position. METASTATIC CARCINOMA of the Right axillary lymph node. This was found to be concordant by Dr. Hassan Rowan. Pathology results were discussed with the patient by telephone. The patient reported doing well after the biopsies with tenderness at the sites. Post biopsy instructions and care were reviewed and questions were answered. The patient was encouraged to call [facility] for any additional concerns. The patient was referred to [facility] on January 17, 2018. Recommendation for bilateral breast MRI due to extreme density. Pathology results reported by Terie Purser, RN on 01/11/2018. Electronically Signed   By: Margarette Canada M.D.   On: 01/11/2018 13:03   Result Date: 01/11/2018 CLINICAL DATA:  22 year old female for tissue sampling of suspicious RIGHT breast mass and enlarged RIGHT axillary lymph  node. EXAM: ULTRASOUND GUIDED RIGHT BREAST CORE NEEDLE BIOPSY ULTRASOUND-GUIDED BIOPSY OF A RIGHT AXILLARY LYMPH NODE COMPARISON:  Previous exam(s). FINDINGS: I met with the patient and we discussed the procedure of ultrasound-guided biopsies, including benefits and alternatives. We discussed the high likelihood of a successful procedure. We discussed the risks of the procedures, including infection, bleeding, tissue injury, clip migration, and inadequate sampling. Informed written consent was given. The usual time-out protocol was performed immediately prior to the procedures. RIGHT BREAST BIOPSY: Lesion quadrant: UPPER INNER Using sterile technique and 1% Lidocaine as local anesthetic, under direct ultrasound visualization, a 12 gauge spring-loaded device was used to perform biopsy of the 2.6 cm hypoechoic mass at the 1 o'clock position 1 cm from the nipple using a  LATERAL approach. At the conclusion of the procedure a spiral HydroMARK tissue marker clip was deployed into the biopsy cavity with placement confirmed sonographically. RIGHT AXILLARY LYMPH NODE BIOPSY: Using sterile technique and 1% Lidocaine as local anesthetic, under direct ultrasound visualization, a 14 gauge spring-loaded device was used to perform biopsy of the enlarged RIGHT axillary lymph node using a LATERAL approach. At the conclusion of the procedure a spiral HydroMARK tissue marker clip was deployed into the biopsy cavity with placement confirmed sonographically. IMPRESSION: Ultrasound guided biopsy of 2.6 cm UPPER INNER RIGHT breast mass and enlarged RIGHT axillary lymph node. No apparent complications. Electronically Signed: By: Margarette Canada M.D. On: 01/10/2018 14:29       IMPRESSION/PLAN: 1. Stage T2 N1 Mx Invasive ductal carcinoma of the upper inner quadrant of the right breast, ER+ PR+ Her2-, grade III. Dr. Sondra Come discusses the pathology findings and reviews the nature of invasive breast disease. The consensus from the breast conference included proceeding with neoadjuvant tamoxifen, sending her to see reproductive endocrinology at [facility] tomorrow. She will also meet with genetics and have a port a cath placed. Dr. Lindi Adie has recommended neoadjuvant chemotherapy as well and staging scans. breast surgery will be determined by the response to treatment and the results of genetics, and the patient's desire. Following surgical and systemic therapy, Dr. Sondra Come recommends external radiotherapy to the breast or chest wall and regional nodes. Her course would continue and  follow with antiestrogen therapy. We discussed the risks, benefits, short, and long term effects of radiotherapy, and the patient is interested in proceeding. Dr. Sondra Come discusses the delivery and logistics of radiotherapy and anticipates a course of 6 or 6 1/2 weeks. We will see her back about 2 weeks after surgery to move  forward with the simulation and planning process and anticipate starting radiotherapy about 4 weeks after surgery.  2. Pain secondary to #1. The patient will continue with OTC tylenol and discuss use of ibuprofen with Dr. Lucia Gaskins as well.  3. Possible genetic predisposition to malignancy. The patient is a candidate for genetic testing and will meet with Genetics tomorrow.  4. Fertility preservation. The patient will meet tomorrow with Dr. Judee Clara at [facility] in Reproductive Endocrinology to discuss options for egg collection. We will follow up with this expectantly.   The above documentation reflects my direct findings during this shared patient visit.   Carola Rhine, PAC  -----------------------------------  Blair Promise, PhD, MD  This document serves as a record of services personally performed by Gery Pray, MD and Shona Simpson, PA-C. It was created on their behalf by Bethann Humble, a trained medical scribe. The creation of this record is based on the scribe's personal observations and the provider's statements to  them. This document has been checked and approved by the attending provider.  

## 2018-01-17 NOTE — Assessment & Plan Note (Signed)
01/10/2018:Palpable right breast mass with calcifications medial right breast at 1 o'clock position: 2.6 cm by ultrasound, 1 abnormal lymph node: Ultrasound biopsy of both the mass and the lymph node revealed grade 3 invasive ductal carcinoma ER 90%, PR 2%, Ki-67 70%, HER-2 negative ratio 0.96, T2 N1 stage II a AJCC 8 clinical stage  Pathology and radiology counseling: Discussed with the patient, the details of pathology including the type of breast cancer,the clinical staging, the significance of ER, PR and HER-2/neu receptors and the implications for treatment. After reviewing the pathology in detail, we proceeded to discuss the different treatment options between surgery, radiation, chemotherapy, antiestrogen therapies.  Recommendation: 1 Genetic testing.  Staging scans 2. neoadjuvant chemotherapy with dose dense Adriamycin and Cytoxan followed by Taxol weekly x12 3.  Followed by surgery depending on genetic test results and patient's preference 4.  Followed by adjuvant radiation 5.  Followed by antiestrogen therapy and evaluation on Natalee clinical trial  UPBEAT clinical trial ([id number]): Newly diagnosed stage I to III breast cancer patients receiving either adjuvant or neoadjuvant chemotherapy undergo cardiac MRI before treatment and at 24 months along with neurocognitive testing, exercise and disability measures at baseline 3, 12 and 24 months.  Return to clinic to start neoadjuvant chemotherapy

## 2018-01-18 ENCOUNTER — Inpatient Hospital Stay (HOSPITAL_BASED_OUTPATIENT_CLINIC_OR_DEPARTMENT_OTHER): Payer: BLUE CROSS/BLUE SHIELD | Admitting: Genetic Counselor

## 2018-01-18 ENCOUNTER — Encounter: Payer: Self-pay | Admitting: Genetic Counselor

## 2018-01-18 DIAGNOSIS — C50919 Malignant neoplasm of unspecified site of unspecified female breast: Secondary | ICD-10-CM

## 2018-01-18 DIAGNOSIS — Z1379 Encounter for other screening for genetic and chromosomal anomalies: Secondary | ICD-10-CM | POA: Diagnosis not present

## 2018-01-18 NOTE — Progress Notes (Signed)
[facility]      Initial Visit   Patient Name: ZOLA RUNION Patient DOB: 30-Sep-1996 Patient Age: 22 y.o. Encounter Date: 01/18/2018  Referring Provider: Nicholas Lose, MD  Primary Care Provider: [facility]  Reason for Visit: Evaluate for hereditary susceptibility to cancer    Assessment and Plan:  . Ms. Luckow' personal history of breast cancer at age 13 is concerning for a hereditary predisposition to cancer even though she has an essentially negative family history. Her relatives in her parents' generation are still very young. Additionally, her mother had no full siblings and her father had no sisters.  . Testing is recommended to determine whether she has a pathogenic mutation that will impact her screening and risk-reduction for cancer.   . Ms. Twilley wished to pursue genetic testing and a blood sample will be sent for analysis of the 83 genes on [facility]'s Multi-Cancer panel (ALK, APC, ATM, AXIN2, BAP1, BARD1, BLM, BMPR1A, BRCA1, BRCA2, BRIP1, CASR, CDC73, CDH1, CDK4, CDKN1B, CDKN1C, CDKN2A, CEBPA, CHEK2, CTNNA1, DICER1, DIS3L2, EGFR, EPCAM, FH, FLCN, GATA2, GPC3, GREM1, HOXB13, HRAS, KIT, MAX, MEN1, MET, MITF, MLH1, MSH2, MSH3, MSH6, MUTYH, NBN, NF1, NF2, NTHL1, PALB2, PDGFRA, PHOX2B, PMS2, POLD1, POLE, POT1, PRKAR1A, PTCH1, PTEN, RAD50, RAD51C, RAD51D, RB1, RECQL4, RET, RUNX1, SDHA, SDHAF2, SDHB, SDHC, SDHD, SMAD4, SMARCA4, SMARCB1, SMARCE1, STK11, SUFU, TERC, TERT, TMEM127, TP53, TSC1, TSC2, VHL, WRN, WT1).   . Results should be available in approximately 2-4 weeks, at which point we will contact her and address implications for her as well as address genetic testing for at-risk family members, if needed.     Dr. Lindi Adie was available for questions concerning this case. Total time spent by me in face-to-face counseling was approximately 30 minutes.    _____________________________________________________________________   History of Present Illness: Ms. CHALLIS CRILL, a 22 y.o. female, is being seen at the [facility] due to a personal history of breast cancer. She presents to clinic today with her aunt to discuss the possibility of a hereditary predisposition to cancer and discuss whether genetic testing is warranted.  Ms. Campanella was recently diagnosed with breast cancer at the age of 80. She will be receiving neoadjuvant chemotherapy followed by surgery and radiation.    Malignant neoplasm of upper-inner quadrant of right breast in female, estrogen receptor positive ([place])   01/10/2018 Initial Diagnosis    Palpable right breast mass with calcifications medial right breast at 1 o'clock position: 2.6 cm by ultrasound, 1 abnormal lymph node: Ultrasound biopsy of both the mass and the lymph node revealed grade 3 invasive ductal carcinoma ER 90%, PR 2%, Ki-67 70%, HER-2 negative ratio 0.96, T2 N1 stage II a AJCC 8 clinical stage        Past Medical History:  Diagnosis Date  . Breast cancer ([place])   . Breast cyst, right     Social History   Socioeconomic History  . Marital status: Single    Spouse name: Not on file  . Number of children: Not on file  . Years of education: Not on file  . Highest education level: Not on file  Occupational History  . Not on file  Social Needs  . Financial resource strain: Not on file  . Food insecurity:    Worry: Not on file    Inability: Not on file  . Transportation needs:    Medical: Not on file    Non-medical: Not on  file  Tobacco Use  . Smoking status: Never Smoker  . Smokeless tobacco: Never Used  Substance and Sexual Activity  . Alcohol use: No    Frequency: Never  . Drug use: No  . Sexual activity: Not on file  Lifestyle  . Physical activity:    Days per week: Not on file    Minutes per session: Not on file  . Stress: Not on file  Relationships  .  Social connections:    Talks on phone: Not on file    Gets together: Not on file    Attends religious service: Not on file    Active member of club or organization: Not on file    Attends meetings of clubs or organizations: Not on file    Relationship status: Not on file  Other Topics Concern  . Not on file  Social History Narrative  . Not on file     Family History:  During the visit, a 4-generation pedigree was obtained. Family tree will be scanned in the Media tab in Epic  Significant diagnoses include the following:  Family History  Problem Relation Age of Onset  . Breast cancer Other 52       maternal grandmother's sister's daughter; currently 35  . Lung cancer Other        2 of maternal grandmother's siblings    Additionally, Ms. Mancillas has no children. She has no full siblings. She has 2 maternal half-sisters (ages 49 and 69). Her mother (age 42) has no full siblings. Her mother has a maternal half-sister (age 73) and half-brother (age 55). Her father (age 66) has only 3 brothers. She had no information about her maternal grandfather and her paternal grandparents.   Ms. Ruffini' ancestry is African American. There is no known Jewish ancestry and no consanguinity.  Discussion: We reviewed the characteristics, features and inheritance patterns of hereditary cancer syndromes. We discussed her risk of harboring a mutation in the context of her personal and family history. We discussed that the paucity of women in her father's generation makes risk assessment challenging. We discussed the process of genetic testing, insurance coverage and implications of results: positive, negative and variant of unknown significance (VUS).    Ms. Kayes' questions were answered to her satisfaction today and she is welcome to call with any additional questions or concerns. Thank you for the referral and allowing [place] to share in the care of your patient.    Steele Berg, MS, [place] Certified Genetic  Counselor phone: [phone] [email or web address]

## 2018-01-19 ENCOUNTER — Ambulatory Visit (HOSPITAL_COMMUNITY)
Admission: RE | Admit: 2018-01-19 | Discharge: 2018-01-19 | Disposition: A | Discharge status: Home / Self Care | Payer: BLUE CROSS/BLUE SHIELD | Source: Ambulatory Visit | Attending: Hematology and Oncology | Admitting: Hematology and Oncology

## 2018-01-19 ENCOUNTER — Telehealth: Payer: Self-pay | Admitting: *Deleted

## 2018-01-19 ENCOUNTER — Encounter (HOSPITAL_COMMUNITY): Payer: Self-pay | Admitting: *Deleted

## 2018-01-19 ENCOUNTER — Other Ambulatory Visit: Payer: Self-pay

## 2018-01-19 DIAGNOSIS — Z170 Estrogen receptor positive status [ER+]: Secondary | ICD-10-CM | POA: Diagnosis present

## 2018-01-19 DIAGNOSIS — C50211 Malignant neoplasm of upper-inner quadrant of right female breast: Secondary | ICD-10-CM

## 2018-01-19 MED ORDER — GADOBENATE DIMEGLUMINE 529 MG/ML IV SOLN
15.0000 mL | Freq: Once | INTRAVENOUS | Status: AC | PRN
  Administered 2018-01-19: 12 mL via INTRAVENOUS

## 2018-01-19 NOTE — Telephone Encounter (Signed)
error 

## 2018-01-19 NOTE — Telephone Encounter (Signed)
Spoke to pt concerning BMDC from 3.20.19. Denies questions or concerns regarding dx or treatment care plan. Pt relate she is thinking on doing donor eggs in future. Pt will continue to think on what she would like to do for fertility prior to treatment. Encourage pt to call with needs. Received verbal understanding.

## 2018-01-21 NOTE — H&P (Signed)
Kim Griffin  Location: [facility] Patient #: [id number] DOB: 15-Dec-1995 Undefined / Language: Cleophus Molt / Race: Black or African American Female  History of Present Illness   The patient is a 22 year old female who presents with a complaint of breast cancer.  The PCP is Lenny Pastel PA  The patient was referred by Dr. Enriqueta Shutter  She has seen Dr. Idolina Primer for GYN  The pateint is at the [facility] - Oncology is Drs. Lindi Adie and Kinard  She is accompanied by her mother, father, and aunt, Janan Ridge.  The patient had no prior history of breast problems or disease. About 3 weeks ago, she noticed a mass in her right breast. This prompted her breast evaluation. She has a second cousin who has breast cancer. She is not on hormonal medication.  Mammograms: [facility] [place] - 01/10/2018 - irregular mass 2.6 cm at the 1 o'clock position of the right breast Biopsy: Right breast biopsy on 01/10/2018 - ([id number]) - shows IDC, ER - 90%, PR - 2%, Ki67 - 70%, Her2Neu - neg Family history of breast or ovarian cancer: 2nd cousin On hormone therapy: No  I discussed the options for breast cancer treatment with the patient. The patient is at the [facility], which includes medical oncology and radiation oncology. I discussed the surgical options of lumpectomy vs. mastectomy. If mastectomy, there is the possibility of reconstruction. I discussed the options of lymph node biopsy. The treatment plan depends on the pathologic staging of the tumor and the patient's personal wishes. The risks of surgery include, but are not limited to, bleeding, infection, the need for further surgery, and nerve injury. The patient has been given literature on the treatment of breast cancer.  I discussed the indications and potential complications of the power port placement. The primary complications of the power port, include, but are not limited  to, bleeding, infection, nerve injury, thrombosis, and pneumothorax.  Plan: 1) Fertility [facility]up at [facility], 2) Genetics, 3) MRI, 4) Neoadjuvant chemthx (to start antihormone tx today), 5) Port placement, 6) Surgery depends on response to therapy  Past Medical History: 1. Healthy young woman  Social History: Unmarried. Graduated [employer]. Lives with Aunt, [place]ell She is accompanied by her mother, father, and aunt, Janan Ridge. Works at [employer] Conni Slipper, RN; 01/17/2018 7:55 AM) Medications Reconciled   Physical Exam  General: WN young AA F alert and generally healthy appearing. Skin: Inspection and palpation of the skin unremarkable.  Eyes: Conjunctivae white, pupils equal. Face, ears, nose, mouth, and throat: Face - normal. Normal ears and nose. Lips and teeth normal.  Neck: Supple. No mass. Trachea midline. No thyroid mass. Lymph Nodes: No supraclavicular or cervical adenopathy. No axillary adenopathy.  Lungs: Normal respiratory effort. Clear to auscultation and symmetric breath sounds. Cardiovascular: Regular rate and rythm. Normal auscultation of the heart. No murmur or rub.  Breast: Right - mass at 1 o'clock (UIQ) about 2 cm. Somewhat dense breast.  Left - No mass. Somewhat dense breast.  Abdomen: Soft. No mass. Liver and spleen not palpable. No tenderness. No hernia. Normal bowel sounds. No abdominal scars. Rectal: Not done.  Musculoskeletal/extremities: Normal gait. Good strength and ROM in upper and lower extremities.  Neurologic: Grossly intact to motor and sensory function. Psychiatric: Has normal mood and affect. Judgement and insight appear normal.   Assessment & Plan  1.  BREAST CANCER, STAGE 2, RIGHT (C50.911)  Story: Biopsy: Right breast biopsy on 01/10/2018 - (  [phone]) - shows IDC, ER - 90%, PR - 2%, Ki67 - 70%, Her2Neu - neg  Seen with Drs. Lindi Adie and Kinard  Plan:    1) [facility] at [facility],   2)  Genetics,    3) MRI,   4) Neoadjuvant chemthx (to start antihormone tx today),    5) Port placement,   6) Surgery depends on response to therapy and other diagnostic tests  Alphonsa Overall, MD, [facility] Surgery Pager: [phone] Office phone:  [phone]

## 2018-01-22 ENCOUNTER — Encounter (HOSPITAL_COMMUNITY): Payer: Self-pay

## 2018-01-22 ENCOUNTER — Other Ambulatory Visit: Payer: Self-pay

## 2018-01-22 ENCOUNTER — Ambulatory Visit (HOSPITAL_COMMUNITY)
Admission: RE | Admit: 2018-01-22 | Discharge: 2018-01-22 | Disposition: A | Discharge status: Home / Self Care | Payer: BLUE CROSS/BLUE SHIELD | Source: Ambulatory Visit | Attending: Surgery | Admitting: Surgery

## 2018-01-22 ENCOUNTER — Encounter (HOSPITAL_COMMUNITY)
Admission: RE | Disposition: A | Discharge status: Home / Self Care | Payer: Self-pay | Source: Ambulatory Visit | Attending: Surgery

## 2018-01-22 ENCOUNTER — Ambulatory Visit (HOSPITAL_COMMUNITY): Payer: BLUE CROSS/BLUE SHIELD | Admitting: Anesthesiology

## 2018-01-22 ENCOUNTER — Ambulatory Visit (HOSPITAL_COMMUNITY): Payer: BLUE CROSS/BLUE SHIELD

## 2018-01-22 DIAGNOSIS — Z95828 Presence of other vascular implants and grafts: Secondary | ICD-10-CM

## 2018-01-22 DIAGNOSIS — Z803 Family history of malignant neoplasm of breast: Secondary | ICD-10-CM | POA: Insufficient documentation

## 2018-01-22 DIAGNOSIS — C50211 Malignant neoplasm of upper-inner quadrant of right female breast: Secondary | ICD-10-CM | POA: Insufficient documentation

## 2018-01-22 DIAGNOSIS — Z419 Encounter for procedure for purposes other than remedying health state, unspecified: Secondary | ICD-10-CM

## 2018-01-22 HISTORY — PX: PORTACATH PLACEMENT: SHX2246

## 2018-01-22 LAB — POCT PREGNANCY, URINE: PREG TEST UR: NEGATIVE

## 2018-01-22 SURGERY — INSERTION, TUNNELED CENTRAL VENOUS DEVICE, WITH PORT
Anesthesia: General | Site: Chest

## 2018-01-22 MED ORDER — BUPIVACAINE-EPINEPHRINE 0.25% -1:200000 IJ SOLN
INTRAMUSCULAR | Status: DC | PRN
  Administered 2018-01-22: 10 mL

## 2018-01-22 MED ORDER — HEPARIN SOD (PORK) LOCK FLUSH 100 UNIT/ML IV SOLN
INTRAVENOUS | Status: DC | PRN
  Administered 2018-01-22: 400 [IU]

## 2018-01-22 MED ORDER — ONDANSETRON HCL 4 MG/2ML IJ SOLN: 4.0000 mg | Freq: Once | INTRAMUSCULAR | Status: DC | PRN

## 2018-01-22 MED ORDER — HEPARIN SOD (PORK) LOCK FLUSH 100 UNIT/ML IV SOLN
INTRAVENOUS | Status: AC
  Filled 2018-01-22: qty 5

## 2018-01-22 MED ORDER — ONDANSETRON HCL 4 MG/2ML IJ SOLN
INTRAMUSCULAR | Status: DC | PRN
  Administered 2018-01-22: 4 mg via INTRAVENOUS

## 2018-01-22 MED ORDER — MIDAZOLAM HCL 5 MG/5ML IJ SOLN
INTRAMUSCULAR | Status: DC | PRN
  Administered 2018-01-22: 2 mg via INTRAVENOUS

## 2018-01-22 MED ORDER — OXYCODONE HCL 5 MG PO TABS
5.0000 mg | ORAL_TABLET | Freq: Once | ORAL | Status: DC | PRN
Start: 2018-01-22 — End: 2018-01-23

## 2018-01-22 MED ORDER — SODIUM CHLORIDE 0.9 % IV SOLN
INTRAVENOUS | Status: AC
  Filled 2018-01-22: qty 1.2

## 2018-01-22 MED ORDER — LACTATED RINGERS IV SOLN
INTRAVENOUS | Status: DC
  Administered 2018-01-22 (×2): via INTRAVENOUS

## 2018-01-22 MED ORDER — OXYCODONE HCL 5 MG/5ML PO SOLN: 5.0000 mg | Freq: Once | ORAL | Status: DC | PRN

## 2018-01-22 MED ORDER — HYDROCODONE-ACETAMINOPHEN 5-325 MG PO TABS: 1.0000 | ORAL_TABLET | Freq: Four times a day (QID) | ORAL | 0 refills | Status: DC | PRN

## 2018-01-22 MED ORDER — PROPOFOL 10 MG/ML IV BOLUS
INTRAVENOUS | Status: DC | PRN
  Administered 2018-01-22: 130 mg via INTRAVENOUS

## 2018-01-22 MED ORDER — FENTANYL CITRATE (PF) 250 MCG/5ML IJ SOLN
INTRAMUSCULAR | Status: AC
  Filled 2018-01-22: qty 5

## 2018-01-22 MED ORDER — PROPOFOL 10 MG/ML IV BOLUS
INTRAVENOUS | Status: AC
  Filled 2018-01-22: qty 20

## 2018-01-22 MED ORDER — FENTANYL CITRATE (PF) 100 MCG/2ML IJ SOLN: 25.0000 ug | INTRAMUSCULAR | Status: DC | PRN

## 2018-01-22 MED ORDER — ACETAMINOPHEN 160 MG/5ML PO SOLN: 325.0000 mg | ORAL | Status: DC | PRN

## 2018-01-22 MED ORDER — MIDAZOLAM HCL 2 MG/2ML IJ SOLN
INTRAMUSCULAR | Status: AC
  Filled 2018-01-22: qty 2

## 2018-01-22 MED ORDER — ACETAMINOPHEN 500 MG PO TABS
1000.0000 mg | ORAL_TABLET | ORAL | Status: AC
  Administered 2018-01-22: 1000 mg via ORAL

## 2018-01-22 MED ORDER — SODIUM CHLORIDE 0.9 % IV SOLN
INTRAVENOUS | Status: DC | PRN
  Administered 2018-01-22: 500 mL

## 2018-01-22 MED ORDER — 0.9 % SODIUM CHLORIDE (POUR BTL) OPTIME
TOPICAL | Status: DC | PRN
  Administered 2018-01-22: 1000 mL

## 2018-01-22 MED ORDER — LIDOCAINE HCL (CARDIAC) 20 MG/ML IV SOLN
INTRAVENOUS | Status: DC | PRN
  Administered 2018-01-22: 40 mg via INTRAVENOUS

## 2018-01-22 MED ORDER — MEPERIDINE HCL 50 MG/ML IJ SOLN: 6.2500 mg | INTRAMUSCULAR | Status: DC | PRN

## 2018-01-22 MED ORDER — CHLORHEXIDINE GLUCONATE CLOTH 2 % EX PADS: 6.0000 | MEDICATED_PAD | Freq: Once | CUTANEOUS | Status: DC

## 2018-01-22 MED ORDER — ACETAMINOPHEN 325 MG PO TABS: 325.0000 mg | ORAL_TABLET | ORAL | Status: DC | PRN

## 2018-01-22 MED ORDER — CEFAZOLIN SODIUM-DEXTROSE 2-4 GM/100ML-% IV SOLN
2.0000 g | INTRAVENOUS | Status: AC
  Administered 2018-01-22: 2 g via INTRAVENOUS

## 2018-01-22 MED ORDER — DEXAMETHASONE SODIUM PHOSPHATE 10 MG/ML IJ SOLN
INTRAMUSCULAR | Status: DC | PRN
  Administered 2018-01-22: 10 mg via INTRAVENOUS

## 2018-01-22 MED ORDER — FENTANYL CITRATE (PF) 100 MCG/2ML IJ SOLN
INTRAMUSCULAR | Status: DC | PRN
  Administered 2018-01-22: 100 ug via INTRAVENOUS
  Administered 2018-01-22: 50 ug via INTRAVENOUS

## 2018-01-22 MED ORDER — BUPIVACAINE-EPINEPHRINE (PF) 0.25% -1:200000 IJ SOLN
INTRAMUSCULAR | Status: AC
  Filled 2018-01-22: qty 30

## 2018-01-22 SURGICAL SUPPLY — 38 items
ADH SKN CLS APL DERMABOND .7 (GAUZE/BANDAGES/DRESSINGS) ×1
BAG DECANTER FOR FLEXI CONT (MISCELLANEOUS) ×3 IMPLANT
CHLORAPREP W/TINT 10.5 ML (MISCELLANEOUS) ×3 IMPLANT
COVER SURGICAL LIGHT HANDLE (MISCELLANEOUS) ×3 IMPLANT
CRADLE DONUT ADULT HEAD (MISCELLANEOUS) ×3 IMPLANT
DERMABOND ADVANCED (GAUZE/BANDAGES/DRESSINGS) ×2
DERMABOND ADVANCED .7 DNX12 (GAUZE/BANDAGES/DRESSINGS) ×1 IMPLANT
DRAPE C-ARM 42X72 X-RAY (DRAPES) ×3 IMPLANT
DRAPE CHEST BREAST 15X10 FENES (DRAPES) ×3 IMPLANT
DRAPE UTILITY XL STRL (DRAPES) ×3 IMPLANT
ELECT CAUTERY BLADE 6.4 (BLADE) ×3 IMPLANT
ELECT REM PT RETURN 9FT ADLT (ELECTROSURGICAL) ×3
ELECTRODE REM PT RTRN 9FT ADLT (ELECTROSURGICAL) ×1 IMPLANT
GAUZE SPONGE 4X4 16PLY XRAY LF (GAUZE/BANDAGES/DRESSINGS) ×3 IMPLANT
GLOVE BIOGEL PI IND STRL 6 (GLOVE) ×1 IMPLANT
GLOVE BIOGEL PI IND STRL 6.5 (GLOVE) ×2 IMPLANT
GLOVE BIOGEL PI INDICATOR 6 (GLOVE) ×2
GLOVE BIOGEL PI INDICATOR 6.5 (GLOVE) ×4
GLOVE SURG SIGNA 7.5 PF LTX (GLOVE) ×6 IMPLANT
GLOVE SURG SS PI 6.5 STRL IVOR (GLOVE) ×9 IMPLANT
GOWN STRL REUS W/ TWL LRG LVL3 (GOWN DISPOSABLE) ×2 IMPLANT
GOWN STRL REUS W/ TWL XL LVL3 (GOWN DISPOSABLE) ×1 IMPLANT
GOWN STRL REUS W/TWL LRG LVL3 (GOWN DISPOSABLE) ×6
GOWN STRL REUS W/TWL XL LVL3 (GOWN DISPOSABLE) ×2
KIT BASIN OR (CUSTOM PROCEDURE TRAY) ×3 IMPLANT
KIT PORT POWER 8FR ISP CVUE (Port) ×3 IMPLANT
KIT TURNOVER KIT B (KITS) ×3 IMPLANT
NEEDLE HYPO 25GX1X1/2 BEV (NEEDLE) ×3 IMPLANT
NS IRRIG 1000ML POUR BTL (IV SOLUTION) ×3 IMPLANT
PACK SURGICAL SETUP 50X90 (CUSTOM PROCEDURE TRAY) ×3 IMPLANT
PAD ARMBOARD 7.5X6 YLW CONV (MISCELLANEOUS) ×3 IMPLANT
PENCIL BUTTON HOLSTER BLD 10FT (ELECTRODE) ×3 IMPLANT
SUT MNCRL AB 4-0 PS2 18 (SUTURE) ×3 IMPLANT
SUT VIC AB 3-0 SH 18 (SUTURE) ×3 IMPLANT
SYR 20ML ECCENTRIC (SYRINGE) ×6 IMPLANT
SYR 5ML LUER SLIP (SYRINGE) ×3 IMPLANT
SYR CONTROL 10ML LL (SYRINGE) ×3 IMPLANT
TOWEL GREEN STERILE (TOWEL DISPOSABLE) ×3 IMPLANT

## 2018-01-22 NOTE — Transfer of Care (Signed)
Immediate Anesthesia Transfer of Care Note  Patient: Kim Griffin  Procedure(s) Performed: INSERTION PORT-A-CATH WITH ULTRASOUND ERAS PATHWAY (N/A Chest)  Patient Location: PACU  Anesthesia Type:General  Level of Consciousness: awake, alert , oriented and patient cooperative  Airway & Oxygen Therapy: Patient Spontanous Breathing  Post-op Assessment: Report given to RN and Post -op Vital signs reviewed and stable  Post vital signs: Reviewed and stable  Last Vitals:  Vitals Value Taken Time  BP 108/60 01/22/2018  6:14 PM  Temp    Pulse 78 01/22/2018  6:15 PM  Resp 14 01/22/2018  6:15 PM  SpO2 100 % 01/22/2018  6:15 PM  Vitals shown include unvalidated device data.  Last Pain:  Vitals:   01/22/18 1322  TempSrc:   PainSc: 0-No pain         Complications: No apparent anesthesia complications

## 2018-01-22 NOTE — Interval H&P Note (Signed)
History and Physical Interval Note:  01/22/2018 4:34 PM  Kim Griffin  has presented today for surgery, with the diagnosis of Right breast cancer  The various methods of treatment have been discussed with the patient and family.  Mother and father with patient.  After consideration of risks, benefits and other options for treatment, the patient has consented to  Procedure(s): [place] (N/A) as a surgical intervention .  The patient's history has been reviewed, patient examined, no change in status, stable for surgery.  I have reviewed the patient's chart and labs.  Questions were answered to the patient's satisfaction.     Shann Medal

## 2018-01-22 NOTE — Anesthesia Preprocedure Evaluation (Signed)
Anesthesia Evaluation  Patient identified by MRN, date of birth, ID band Patient awake    Reviewed: Allergy & Precautions, NPO status   Airway Mallampati: I       Dental no notable dental hx. (+) Teeth Intact   Pulmonary neg pulmonary ROS,    breath sounds clear to auscultation       Cardiovascular negative cardio ROS Normal cardiovascular exam Rhythm:Regular Rate:Normal     Neuro/Psych negative neurological ROS     GI/Hepatic negative GI ROS, Neg liver ROS,   Endo/Other  negative endocrine ROS  Renal/GU negative Renal ROS  negative genitourinary   Musculoskeletal negative musculoskeletal ROS (+)   Abdominal Normal abdominal exam  (+)   Peds negative pediatric ROS (+)  Hematology negative hematology ROS (+)   Anesthesia Other Findings   Reproductive/Obstetrics negative OB ROS                             Anesthesia Physical Anesthesia Plan  ASA: II  Anesthesia Plan: General   Post-op Pain Management:    Induction: Intravenous  PONV Risk Score and Plan: 3 and Ondansetron and Dexamethasone  Airway Management Planned: LMA  Additional Equipment:   Intra-op Plan:   Post-operative Plan:   Informed Consent: I have reviewed the patients History and Physical, chart, labs and discussed the procedure including the risks, benefits and alternatives for the proposed anesthesia with the patient or authorized representative who has indicated his/her understanding and acceptance.   Dental advisory given  Plan Discussed with: Surgeon and CRNA  Anesthesia Plan Comments:         Anesthesia Quick Evaluation

## 2018-01-22 NOTE — Op Note (Signed)
01/22/2018  6:04 PM  PATIENT:  Kim Griffin, 22 y.o., female MRN: [id number] DOB: [birth year]  PREOP DIAGNOSIS:  Right breast cancer, anticipate chemotherapy  POSTOP DIAGNOSIS:   Right breast cancer, anticipate chemotherapy  PROCEDURE:   Procedure(s):  INSERTION PORT-A-CATH in left subclavian vein  SURGEON:   Alphonsa Overall, M.D.  ANESTHESIA:   general  Anesthesiologist: Lyn Hollingshead, MD CRNA: Laretta Alstrom, CRNA  General  EBL:  minimal  ml  COUNTS CORRECT:  YES  INDICATIONS FOR PROCEDURE:  Kim Griffin is a 21 y.o. (DOB: Apr 25, 1996) AA female whose primary care physician is [facility] and comes for power port placement for the treatment of right breast cancer.  Dr. Lindi Adie is her treating oncologist.   The indications and risks of the surgery were explained to the patient.  The risks include, but are not limited to, infection, bleeding, pneumothorax, nerve injury, and thrombosis of the vein.  OPERATIVE NOTE:  The patient was taken to operating room #9 at [facility].  Anesthesia was provided by Anesthesiologist: Lyn Hollingshead, MD CRNA: Laretta Alstrom, CRNA.  At the beginning of the operation, the patient was given 2 gm Ancef, had a roll placed under her back, and had the upper chest/neck prepped with Chloroprep and draped.   A time out was held and the surgery checklist reviewed.   The patient was placed in Trendelenburg position.  The left subclavian vein was accessed with a 16 gauge needle and a guide wire threaded through the needle into the vein.  The position of the wire was checked with fluoroscopy.   I then developed a pocket in the upper inner aspect of the left chest for the port reservoir.  I used the [employer] for venous access.  The reservoir was sewn in place with a 3-0 Vicryl suture.  The reservoir had been flushed with dilute (10 units/cc) heparin.   I then passed the silastic tubing from the reservoir incision  to the subclavian stick site and used the 8 French introducer to pass it into the vein.  The tip of the silastic catheter was position at the junction of the SVC and the right atrium under fluoroscopy.  The silastic catheter was then attached to the port with the bayonet device.     The entire port and tubing were checked with fluoroscopy and then the port was flushed with 4 cc of concentrated heparin (100 units/cc).   The wounds were then closed with 3-0 vicryl subcutaneous sutures and the skin closed with a 4-0 Monocryl suture.  The skin was painted with DermaBond.   The patient was transferred to the recovery room in good condition.  The sponge and needle count were correct at the end of the case.  A CXR is ordered for port placement and pending at the time of this note.  Alphonsa Overall, MD, [facility] Pager: [phone] Office phone:  [phone]

## 2018-01-22 NOTE — Discharge Instructions (Signed)
[facility] - DISCHARGE INSTRUCTIONS TO PATIENT  Activity:  Driving - May drive in 2 or 3 days.   Lifting - No lifting more than 15 pounds for 1 week, then no limit  Wound Care:   Leave incision dry for 2 days, then you may shower  Diet:  As tolerated  Follow up appointment:  Call Dr. Pollie Friar office [facility]) at [phone] for an appointment in 4 to 6 weeks.  Medications and dosages:  Resume your home medications.  You have a prescription for:  Vicodin  Call Dr. Lucia Gaskins or his office  [phone]) if you have:  Temperature greater than 100.4,  Persistent nausea and vomiting,  Severe uncontrolled pain,  Redness, tenderness, or signs of infection (pain, swelling, redness, odor or green/yellow discharge around the site),  Difficulty breathing, headache or visual disturbances,  Any other questions or concerns you may have after discharge.  In an emergency, call 911 or go to an Emergency Department at a nearby hospital.

## 2018-01-23 ENCOUNTER — Encounter (HOSPITAL_COMMUNITY): Payer: Self-pay | Admitting: Surgery

## 2018-01-23 ENCOUNTER — Telehealth: Payer: Self-pay | Admitting: Hematology and Oncology

## 2018-01-23 ENCOUNTER — Other Ambulatory Visit: Payer: Self-pay | Admitting: Hematology and Oncology

## 2018-01-23 DIAGNOSIS — Z170 Estrogen receptor positive status [ER+]: Principal | ICD-10-CM

## 2018-01-23 DIAGNOSIS — C50211 Malignant neoplasm of upper-inner quadrant of right female breast: Secondary | ICD-10-CM

## 2018-01-23 MED ORDER — DEXAMETHASONE 4 MG PO TABS: ORAL_TABLET | ORAL | 0 refills | Status: DC

## 2018-01-23 MED ORDER — LIDOCAINE-PRILOCAINE 2.5-2.5 % EX CREA
TOPICAL_CREAM | CUTANEOUS | 3 refills | Status: DC
Start: 2018-01-23 — End: 2018-07-06

## 2018-01-23 MED ORDER — LORAZEPAM 0.5 MG PO TABS
0.5000 mg | ORAL_TABLET | Freq: Every evening | ORAL | 0 refills | Status: DC | PRN
Start: 2018-01-23 — End: 2018-07-06

## 2018-01-23 MED ORDER — PROCHLORPERAZINE MALEATE 10 MG PO TABS: 10.0000 mg | ORAL_TABLET | Freq: Four times a day (QID) | ORAL | 1 refills | Status: DC | PRN

## 2018-01-23 MED ORDER — ONDANSETRON HCL 8 MG PO TABS: 8.0000 mg | ORAL_TABLET | Freq: Two times a day (BID) | ORAL | 1 refills | Status: DC | PRN

## 2018-01-23 NOTE — Progress Notes (Signed)
START ON PATHWAY REGIMEN - Breast   Doxorubicin + Cyclophosphamide (AC):   A cycle is every 21 days:     Doxorubicin      Cyclophosphamide   **Always confirm dose/schedule in your pharmacy ordering system**    Paclitaxel 80 mg/m2 Weekly:   Administer weekly:     Paclitaxel   **Always confirm dose/schedule in your pharmacy ordering system**    Patient Characteristics: Preoperative or Nonsurgical Candidate (Clinical Staging), Neoadjuvant Therapy followed by Surgery, Invasive Disease, Chemotherapy, HER2 Negative/Unknown/Equivocal, ER Positive Therapeutic Status: Preoperative or Nonsurgical Candidate (Clinical Staging) AJCC M Category: cM0 AJCC Grade: G3 Breast Surgical Plan: Neoadjuvant Therapy followed by Surgery ER Status: Positive (+) AJCC 8 Stage Grouping: IIB HER2 Status: Negative (-) AJCC T Category: cT2 AJCC N Category: cN0 PR Status: Negative (-) Intent of Therapy: Curative Intent, Discussed with Patient

## 2018-01-23 NOTE — Telephone Encounter (Signed)
Scheduled appt per 3/26 sch message - patient is aware of appt date and time and to get an updated schedule when they come in for chemo edu class on 3/28

## 2018-01-24 ENCOUNTER — Encounter: Payer: Self-pay | Admitting: Hematology and Oncology

## 2018-01-24 ENCOUNTER — Other Ambulatory Visit: Payer: Self-pay | Admitting: Surgery

## 2018-01-24 DIAGNOSIS — R9389 Abnormal findings on diagnostic imaging of other specified body structures: Secondary | ICD-10-CM

## 2018-01-24 NOTE — Progress Notes (Signed)
Pt's Lidocaine-Prilocaine cream was approved from 01/24/18 - 04/26/18.

## 2018-01-24 NOTE — Progress Notes (Signed)
Submitted auth request for Lidocaine-Prilocaine cream today.  Status is pending.  °

## 2018-01-24 NOTE — Anesthesia Postprocedure Evaluation (Signed)
Anesthesia Post Note  Patient: Kim Griffin  Procedure(s) Performed: INSERTION PORT-A-CATH WITH ULTRASOUND ERAS PATHWAY (N/A Chest)     Patient location during evaluation: PACU Anesthesia Type: General Level of consciousness: awake Pain management: pain level controlled Vital Signs Assessment: post-procedure vital signs reviewed and stable Respiratory status: spontaneous breathing Cardiovascular status: stable Postop Assessment: no apparent nausea or vomiting Anesthetic complications: no    Last Vitals:  Vitals:   01/22/18 1927 01/22/18 1930  BP: 106/71   Pulse: 67 65  Resp: (!) 21 12  Temp:    SpO2: 100% 100%    Last Pain:  Vitals:   01/22/18 1926  TempSrc:   PainSc: 0-No pain   Pain Goal:                 Charnita Trudel JR,JOHN Molly Maselli

## 2018-01-25 ENCOUNTER — Inpatient Hospital Stay: Payer: BLUE CROSS/BLUE SHIELD

## 2018-01-25 ENCOUNTER — Ambulatory Visit (HOSPITAL_COMMUNITY)
Admission: RE | Admit: 2018-01-25 | Discharge: 2018-01-25 | Disposition: A | Discharge status: Home / Self Care | Payer: BLUE CROSS/BLUE SHIELD | Source: Ambulatory Visit | Attending: Hematology and Oncology | Admitting: Hematology and Oncology

## 2018-01-25 DIAGNOSIS — C50211 Malignant neoplasm of upper-inner quadrant of right female breast: Secondary | ICD-10-CM | POA: Insufficient documentation

## 2018-01-25 DIAGNOSIS — Z170 Estrogen receptor positive status [ER+]: Secondary | ICD-10-CM | POA: Diagnosis not present

## 2018-01-25 NOTE — Progress Notes (Signed)
  Echocardiogram 2D Echocardiogram has been performed.  Kim Griffin 01/25/2018, 12:03 PM

## 2018-01-26 ENCOUNTER — Encounter (HOSPITAL_COMMUNITY): Payer: BLUE CROSS/BLUE SHIELD

## 2018-01-26 ENCOUNTER — Telehealth: Payer: Self-pay | Admitting: *Deleted

## 2018-01-26 ENCOUNTER — Telehealth: Payer: Self-pay | Admitting: Hematology and Oncology

## 2018-01-26 ENCOUNTER — Encounter (HOSPITAL_COMMUNITY)
Admission: RE | Admit: 2018-01-26 | Discharge: 2018-01-26 | Disposition: A | Discharge status: Home / Self Care | Payer: BLUE CROSS/BLUE SHIELD | Source: Ambulatory Visit | Attending: Hematology and Oncology | Admitting: Hematology and Oncology

## 2018-01-26 ENCOUNTER — Ambulatory Visit (HOSPITAL_COMMUNITY)
Admission: RE | Admit: 2018-01-26 | Discharge: 2018-01-26 | Disposition: A | Discharge status: Home / Self Care | Payer: BLUE CROSS/BLUE SHIELD | Source: Ambulatory Visit | Attending: Hematology and Oncology | Admitting: Hematology and Oncology

## 2018-01-26 ENCOUNTER — Encounter (HOSPITAL_COMMUNITY): Payer: Self-pay

## 2018-01-26 DIAGNOSIS — Z170 Estrogen receptor positive status [ER+]: Principal | ICD-10-CM

## 2018-01-26 DIAGNOSIS — C50211 Malignant neoplasm of upper-inner quadrant of right female breast: Secondary | ICD-10-CM

## 2018-01-26 MED ORDER — IOPAMIDOL (ISOVUE-300) INJECTION 61%: 100.0000 mL | Freq: Once | INTRAVENOUS | Status: DC | PRN

## 2018-01-26 MED ORDER — IOPAMIDOL (ISOVUE-300) INJECTION 61%
INTRAVENOUS | Status: AC
  Administered 2018-01-26: 100 mL
  Filled 2018-01-26: qty 100

## 2018-01-26 MED ORDER — TECHNETIUM TC 99M MEDRONATE IV KIT
25.0000 | PACK | Freq: Once | INTRAVENOUS | Status: AC | PRN
  Administered 2018-01-26: 20 via INTRAVENOUS

## 2018-01-26 MED ORDER — HEPARIN SOD (PORK) LOCK FLUSH 100 UNIT/ML IV SOLN
INTRAVENOUS | Status: AC
  Filled 2018-01-26: qty 5

## 2018-01-26 MED ORDER — HEPARIN SOD (PORK) LOCK FLUSH 100 UNIT/ML IV SOLN
500.0000 [IU] | Freq: Once | INTRAVENOUS | Status: AC
  Administered 2018-01-26: 500 [IU] via INTRAVENOUS

## 2018-01-26 NOTE — Telephone Encounter (Signed)
Called pt with negative CT results. Denies questions or needs. Confirmed appt on 4/1. Pt picked up anti-nausea medications today

## 2018-01-26 NOTE — Telephone Encounter (Signed)
Left message for patient regarding upcoming April appointments per 3/28 sch message.  °

## 2018-01-26 NOTE — Telephone Encounter (Signed)
01/26/18 @ 1:40 pm, spoke to patient's mother and informed FMLA paperwork has been faxed successfully to [employer] at [phone], and a copy has been mailed to patient address on file.

## 2018-01-26 NOTE — Telephone Encounter (Signed)
Left vm for case worker Dominque Callard [phone]) to discuss referral to change medicaid coverage. Contact information provided.

## 2018-01-29 ENCOUNTER — Inpatient Hospital Stay: Payer: BLUE CROSS/BLUE SHIELD | Attending: Hematology and Oncology

## 2018-01-29 ENCOUNTER — Inpatient Hospital Stay: Payer: BLUE CROSS/BLUE SHIELD

## 2018-01-29 ENCOUNTER — Inpatient Hospital Stay (HOSPITAL_BASED_OUTPATIENT_CLINIC_OR_DEPARTMENT_OTHER): Payer: BLUE CROSS/BLUE SHIELD | Admitting: Hematology and Oncology

## 2018-01-29 ENCOUNTER — Encounter: Payer: Self-pay | Admitting: *Deleted

## 2018-01-29 DIAGNOSIS — C50211 Malignant neoplasm of upper-inner quadrant of right female breast: Secondary | ICD-10-CM

## 2018-01-29 DIAGNOSIS — Z5189 Encounter for other specified aftercare: Secondary | ICD-10-CM | POA: Insufficient documentation

## 2018-01-29 DIAGNOSIS — D649 Anemia, unspecified: Secondary | ICD-10-CM | POA: Insufficient documentation

## 2018-01-29 DIAGNOSIS — Z170 Estrogen receptor positive status [ER+]: Secondary | ICD-10-CM | POA: Diagnosis not present

## 2018-01-29 DIAGNOSIS — Z5111 Encounter for antineoplastic chemotherapy: Secondary | ICD-10-CM | POA: Insufficient documentation

## 2018-01-29 LAB — CMP (CANCER CENTER ONLY)
ALT: 11 U/L (ref 0–55)
AST: 12 U/L (ref 5–34)
Albumin: 3.6 g/dL (ref 3.5–5.0)
Alkaline Phosphatase: 68 U/L (ref 40–150)
Anion gap: 10 (ref 3–11)
BUN: 12 mg/dL (ref 7–26)
CALCIUM: 9.2 mg/dL (ref 8.4–10.4)
CHLORIDE: 103 mmol/L (ref 98–109)
CO2: 25 mmol/L (ref 22–29)
CREATININE: 0.78 mg/dL (ref 0.60–1.10)
Glucose, Bld: 143 mg/dL — ABNORMAL HIGH (ref 70–140)
Potassium: 3.4 mmol/L — ABNORMAL LOW (ref 3.5–5.1)
Sodium: 138 mmol/L (ref 136–145)
Total Bilirubin: 0.3 mg/dL (ref 0.2–1.2)
Total Protein: 7 g/dL (ref 6.4–8.3)

## 2018-01-29 LAB — CBC WITH DIFFERENTIAL (CANCER CENTER ONLY)
BASOS ABS: 0 10*3/uL (ref 0.0–0.1)
Basophils Relative: 0 %
EOS ABS: 0.1 10*3/uL (ref 0.0–0.5)
Eosinophils Relative: 3 %
HCT: 35.6 % (ref 34.8–46.6)
HEMOGLOBIN: 11.4 g/dL — AB (ref 11.6–15.9)
LYMPHS ABS: 2 10*3/uL (ref 0.9–3.3)
Lymphocytes Relative: 40 %
MCH: 21.2 pg — ABNORMAL LOW (ref 25.1–34.0)
MCHC: 31.9 g/dL (ref 31.5–36.0)
MCV: 66.5 fL — ABNORMAL LOW (ref 79.5–101.0)
Monocytes Absolute: 0.9 10*3/uL (ref 0.1–0.9)
Monocytes Relative: 19 %
NEUTROS PCT: 38 %
Neutro Abs: 1.8 10*3/uL (ref 1.5–6.5)
Platelet Count: 286 10*3/uL (ref 145–400)
RBC: 5.35 MIL/uL (ref 3.70–5.45)
RDW: 15.6 % — ABNORMAL HIGH (ref 11.2–14.5)
WBC: 4.8 10*3/uL (ref 3.9–10.3)

## 2018-01-29 MED ORDER — SODIUM CHLORIDE 0.9 % IV SOLN
Freq: Once | INTRAVENOUS | Status: AC
  Administered 2018-01-29: 09:00:00 via INTRAVENOUS

## 2018-01-29 MED ORDER — GOSERELIN ACETATE 3.6 MG ~~LOC~~ IMPL
DRUG_IMPLANT | SUBCUTANEOUS | Status: AC
  Filled 2018-01-29: qty 3.6

## 2018-01-29 MED ORDER — GOSERELIN ACETATE 3.6 MG ~~LOC~~ IMPL
3.6000 mg | DRUG_IMPLANT | Freq: Once | SUBCUTANEOUS | Status: AC
  Administered 2018-01-29: 3.6 mg via SUBCUTANEOUS

## 2018-01-29 MED ORDER — SODIUM CHLORIDE 0.9% FLUSH
10.0000 mL | INTRAVENOUS | Status: DC | PRN
  Administered 2018-01-29: 10 mL
  Filled 2018-01-29: qty 10

## 2018-01-29 MED ORDER — DOXORUBICIN HCL CHEMO IV INJECTION 2 MG/ML
60.0000 mg/m2 | Freq: Once | INTRAVENOUS | Status: AC
  Administered 2018-01-29: 94 mg via INTRAVENOUS
  Filled 2018-01-29: qty 47

## 2018-01-29 MED ORDER — PALONOSETRON HCL INJECTION 0.25 MG/5ML
0.2500 mg | Freq: Once | INTRAVENOUS | Status: AC
  Administered 2018-01-29: 0.25 mg via INTRAVENOUS

## 2018-01-29 MED ORDER — SODIUM CHLORIDE 0.9 % IV SOLN
Freq: Once | INTRAVENOUS | Status: AC
  Administered 2018-01-29: 10:00:00 via INTRAVENOUS
  Filled 2018-01-29: qty 5

## 2018-01-29 MED ORDER — SODIUM CHLORIDE 0.9 % IV SOLN
600.0000 mg/m2 | Freq: Once | INTRAVENOUS | Status: AC
  Administered 2018-01-29: 940 mg via INTRAVENOUS
  Filled 2018-01-29: qty 47

## 2018-01-29 MED ORDER — PALONOSETRON HCL INJECTION 0.25 MG/5ML
INTRAVENOUS | Status: AC
  Filled 2018-01-29: qty 5

## 2018-01-29 MED ORDER — HEPARIN SOD (PORK) LOCK FLUSH 100 UNIT/ML IV SOLN
500.0000 [IU] | Freq: Once | INTRAVENOUS | Status: AC | PRN
  Administered 2018-01-29: 500 [IU]
  Filled 2018-01-29: qty 5

## 2018-01-29 NOTE — Assessment & Plan Note (Signed)
01/10/2018:Palpable right breast mass with calcifications medial right breast at 1 o'clock position: 2.6 cm by ultrasound, 1 abnormal lymph node: Ultrasound biopsy of both the mass and the lymph node revealed grade 3 invasive ductal carcinoma ER 90%, PR 2%, Ki-67 70%, HER-2 negative ratio 0.96, T2 N1 stage II a AJCC 8 clinical stage  Recommendation: 1 Genetic testing.  Staging scans 2. neoadjuvant chemotherapy with dose dense Adriamycin and Cytoxan followed by Taxol weekly x12 3.  Followed by surgery depending on genetic test results and patient's preference 4.  Followed by adjuvant radiation 5.  Followed by antiestrogen therapy and evaluation on Natalee clinical trial ------------------------------------------------------------------------ Current treatment: Cycle 1 day 1 dose dense Adriamycin Cytoxan Antiemetics were reviewed Chemotherapy consent obtained Chemotherapy education completed Echocardiogram 01/25/2018: EF 65 to 70% Closely monitoring for chemotherapy toxicities. Return to clinic in one week for toxicity check

## 2018-01-29 NOTE — Progress Notes (Signed)
Patient Care Team: [facility], [facility] as PCP - General Nicholas Lose, MD as Consulting Physician (Hematology and Oncology) Alphonsa Overall, MD as Consulting Physician (General Surgery) Gery Pray, MD as Consulting Physician (Radiation Oncology)  DIAGNOSIS:  Encounter Diagnosis  Name Primary?  . Malignant neoplasm of upper-inner quadrant of right breast in female, estrogen receptor positive ([place])     SUMMARY OF ONCOLOGIC HISTORY:   Malignant neoplasm of upper-inner quadrant of right breast in female, estrogen receptor positive ([place])   01/10/2018 Initial Diagnosis    Palpable right breast mass with calcifications medial right breast at 1 o'clock position: 2.6 cm by ultrasound, 1 abnormal lymph node: Ultrasound biopsy of both the mass and the lymph node revealed grade 3 invasive ductal carcinoma ER 90%, PR 2%, Ki-67 70%, HER-2 negative ratio 0.96, T2 N1 stage II a AJCC 8 clinical stage      01/29/2018 -  Neo-Adjuvant Chemotherapy    Dose dense Adriamycin and Cytoxan followed by Taxol x12       CHIEF COMPLIANT: Cycle 1 day 1 dose dense Adriamycin and Cytoxan  INTERVAL HISTORY: Kim Griffin is a 22 year old with above-mentioned history of right breast cancer who is here today to receive her first cycle of neoadjuvant chemotherapy with dose dense Adriamycin and Cytoxan.  She did not want to go through fertility preservation process.  She went through [facility] and decided against it.  She is going to receive Zoladex injections monthly.  She is ready to get started and anxious to begin.  REVIEW OF SYSTEMS:   Constitutional: Denies fevers, chills or abnormal weight loss Eyes: Denies blurriness of vision Ears, nose, mouth, throat, and face: Denies mucositis or sore throat Respiratory: Denies cough, dyspnea or wheezes Cardiovascular: Denies palpitation, chest discomfort Gastrointestinal:  Denies nausea, heartburn or change in bowel habits Skin: Denies  abnormal skin rashes Lymphatics: Denies new lymphadenopathy or easy bruising Neurological:Denies numbness, tingling or new weaknesses Behavioral/Psych: Mood is stable, no new changes  Extremities: No lower extremity edema All other systems were reviewed with the patient and are negative.  I have reviewed the past medical history, past surgical history, social history and family history with the patient and they are unchanged from previous note.  ALLERGIES:  has No Known Allergies.  MEDICATIONS:  Current Outpatient Medications  Medication Sig Dispense Refill  . dexamethasone (DECADRON) 4 MG tablet Take 1 tab day after chemo and 1 tab 2 days after chemo with food 10 tablet 0  . ferrous sulfate 325 (65 FE) MG tablet Take 325 mg by mouth daily with breakfast.    . HYDROcodone-acetaminophen (NORCO/VICODIN) 5-325 MG tablet Take 1 tablet by mouth every 6 (six) hours as needed for moderate pain. 20 tablet 0  . ibuprofen (ADVIL,MOTRIN) 800 MG tablet Take 800 mg by mouth every 8 (eight) hours as needed for mild pain or moderate pain.    Marland Kitchen lidocaine-prilocaine (EMLA) cream Apply to affected area once 30 g 3  . LORazepam (ATIVAN) 0.5 MG tablet Take 1 tablet (0.5 mg total) by mouth at bedtime as needed for sleep. 30 tablet 0  . ondansetron (ZOFRAN) 8 MG tablet Take 1 tablet (8 mg total) by mouth 2 (two) times daily as needed. Start on the third day after chemotherapy. 30 tablet 1  . prochlorperazine (COMPAZINE) 10 MG tablet Take 1 tablet (10 mg total) by mouth every 6 (six) hours as needed (Nausea or vomiting). 30 tablet 1   No current facility-administered medications for this  visit.    Facility-Administered Medications Ordered in Other Visits  Medication Dose Route Frequency Provider Last Rate Last Dose  . sodium chloride flush (NS) 0.9 % injection 10 mL  10 mL Intracatheter PRN Nicholas Lose, MD   10 mL at 01/29/18 1123    PHYSICAL EXAMINATION: ECOG PERFORMANCE STATUS: 1 - Symptomatic but  completely ambulatory  Vitals:   01/29/18 0848  BP: 120/71  Pulse: 89  Resp: 17  Temp: 98 F (36.7 C)  SpO2: 100%   Filed Weights   01/29/18 0848  Weight: 124 lb 8 oz (56.5 kg)    GENERAL:alert, no distress and comfortable SKIN: skin color, texture, turgor are normal, no rashes or significant lesions EYES: normal, Conjunctiva are pink and non-injected, sclera clear OROPHARYNX:no exudate, no erythema and lips, buccal mucosa, and tongue normal  NECK: supple, thyroid normal size, non-tender, without nodularity LYMPH:  no palpable lymphadenopathy in the cervical, axillary or inguinal LUNGS: clear to auscultation and percussion with normal breathing effort HEART: regular rate & rhythm and no murmurs and no lower extremity edema ABDOMEN:abdomen soft, non-tender and normal bowel sounds MUSCULOSKELETAL:no cyanosis of digits and no clubbing  NEURO: alert & oriented x 3 with fluent speech, no focal motor/sensory deficits EXTREMITIES: No lower extremity edema  LABORATORY DATA:  I have reviewed the data as listed CMP Latest Ref Rng & Units 01/29/2018 01/17/2018 01/13/2011  Glucose 70 - 140 mg/dL 143(H) 102 148(H)  BUN 7 - 26 mg/dL _0 Creatinine 0.60 - 1.10 mg/dL 0.78 0.81 0.87  Sodium 136 - 145 mmol/L 138 141 132(L)  Potassium 3.5 - 5.1 mmol/L 3.4(L) 4.2 3.3(L)  Chloride 98 - 109 mmol/L 103 103 99  CO2 22 - 29 mmol/L _1 Calcium 8.4 - 10.4 mg/dL 9.2 9.8 8.7  Total Protein 6.4 - 8.3 g/dL 7.0 8.1 -  Total Bilirubin 0.2 - 1.2 mg/dL 0.3 0.2 -  Alkaline Phos 40 - 150 U/L 68 70 -  AST 5 - 34 U/L 12 15 -  ALT 0 - 55 U/L 11 14 -    Lab Results  Component Value Date   WBC 4.8 01/29/2018   HGB 13.1 01/13/2011   HCT 35.6 01/29/2018   MCV 66.5 (L) 01/29/2018   PLT 286 01/29/2018   NEUTROABS 1.8 01/29/2018    ASSESSMENT & PLAN:  Malignant neoplasm of upper-inner quadrant of right breast in female, estrogen receptor positive ([place]) 01/10/2018:Palpable right breast mass with  calcifications medial right breast at 1 o'clock position: 2.6 cm by ultrasound, 1 abnormal lymph node: Ultrasound biopsy of both the mass and the lymph node revealed grade 3 invasive ductal carcinoma ER 90%, PR 2%, Ki-67 70%, HER-2 negative ratio 0.96, T2 N1 stage II a AJCC 8 clinical stage  Recommendation: 1 Genetic testing.  Staging scans 2. neoadjuvant chemotherapy with dose dense Adriamycin and Cytoxan followed by Taxol weekly x12 3.  Followed by surgery depending on genetic test results and patient's preference 4.  Followed by adjuvant radiation 5.  Followed by antiestrogen therapy and evaluation on Natalee clinical trial ------------------------------------------------------------------------ Current treatment: Cycle 1 day 1 dose dense Adriamycin Cytoxan Antiemetics were reviewed Chemotherapy consent obtained Chemotherapy education completed Echocardiogram 01/25/2018: EF 65 to 70% Closely monitoring for chemotherapy toxicities. Return to clinic in one week for toxicity check   No orders of the defined types were placed in this encounter.  The patient has a good understanding of the overall plan. she agrees with it. she will call with  any problems that may develop before the next visit here.   Harriette Ohara, MD 01/29/18

## 2018-01-29 NOTE — Patient Instructions (Signed)
[facility] Discharge Instructions for Patients Receiving Chemotherapy  Today you received the following chemotherapy agents Adriamycin and Cytoxan  To help prevent nausea and vomiting after your treatment, we encourage you to take your nausea medication as directed   If you develop nausea and vomiting that is not controlled by your nausea medication, call the clinic.   BELOW ARE SYMPTOMS THAT SHOULD BE REPORTED IMMEDIATELY:  *FEVER GREATER THAN 100.5 F  *CHILLS WITH OR WITHOUT FEVER  NAUSEA AND VOMITING THAT IS NOT CONTROLLED WITH YOUR NAUSEA MEDICATION  *UNUSUAL SHORTNESS OF BREATH  *UNUSUAL BRUISING OR BLEEDING  TENDERNESS IN MOUTH AND THROAT WITH OR WITHOUT PRESENCE OF ULCERS  *URINARY PROBLEMS  *BOWEL PROBLEMS  UNUSUAL RASH Items with * indicate a potential emergency and should be followed up as soon as possible.  Feel free to call the clinic should you have any questions or concerns. The clinic phone number is [phone].  Please show the CHEMO ALERT CARD at check-in to the Emergency Department and triage nurse.   Doxorubicin (Adriamycin) injection What is this medicine? DOXORUBICIN (dox oh ROO bi sin) is a chemotherapy drug. It is used to treat many kinds of cancer like leukemia, lymphoma, neuroblastoma, sarcoma, and Wilms' tumor. It is also used to treat bladder cancer, breast cancer, lung cancer, ovarian cancer, stomach cancer, and thyroid cancer. This medicine may be used for other purposes; ask your health care provider or pharmacist if you have questions. COMMON BRAND NAME(S): Adriamycin, Adriamycin PFS, Adriamycin RDF, Rubex What should I tell my health care provider before I take this medicine? They need to know if you have any of these conditions: -heart disease -history of low blood counts caused by a medicine -liver disease -recent or ongoing radiation therapy -an unusual or allergic reaction to doxorubicin, other chemotherapy agents,  other medicines, foods, dyes, or preservatives -pregnant or trying to get pregnant -breast-feeding How should I use this medicine? This drug is given as an infusion into a vein. It is administered in a hospital or clinic by a specially trained health care professional. If you have pain, swelling, burning or any unusual feeling around the site of your injection, tell your health care professional right away. Talk to your pediatrician regarding the use of this medicine in children. Special care may be needed. Overdosage: If you think you have taken too much of this medicine contact a poison control center or emergency room at once. NOTE: This medicine is only for you. Do not share this medicine with others. What if I miss a dose? It is important not to miss your dose. Call your doctor or health care professional if you are unable to keep an appointment. What may interact with this medicine? This medicine may interact with the following medications: -6-mercaptopurine -paclitaxel -phenytoin -St. John's Wort -trastuzumab -verapamil This list may not describe all possible interactions. Give your health care provider a list of all the medicines, herbs, non-prescription drugs, or dietary supplements you use. Also tell them if you smoke, drink alcohol, or use illegal drugs. Some items may interact with your medicine. What should I watch for while using this medicine? This drug may make you feel generally unwell. This is not uncommon, as chemotherapy can affect healthy cells as well as cancer cells. Report any side effects. Continue your course of treatment even though you feel ill unless your doctor tells you to stop. There is a maximum amount of this medicine you should receive throughout your life. The amount   depends on the medical condition being treated and your overall health. Your doctor will watch how much of this medicine you receive in your lifetime. Tell your doctor if you have taken this  medicine before. You may need blood work done while you are taking this medicine. Your urine may turn red for a few days after your dose. This is not blood. If your urine is dark or brown, call your doctor. In some cases, you may be given additional medicines to help with side effects. Follow all directions for their use. Call your doctor or health care professional for advice if you get a fever, chills or sore throat, or other symptoms of a cold or flu. Do not treat yourself. This drug decreases your body's ability to fight infections. Try to avoid being around people who are sick. This medicine may increase your risk to bruise or bleed. Call your doctor or health care professional if you notice any unusual bleeding. Talk to your doctor about your risk of cancer. You may be more at risk for certain types of cancers if you take this medicine. Do not become pregnant while taking this medicine or for 6 months after stopping it. Women should inform their doctor if they wish to become pregnant or think they might be pregnant. Men should not father a child while taking this medicine and for 6 months after stopping it. There is a potential for serious side effects to an unborn child. Talk to your health care professional or pharmacist for more information. Do not breast-feed an infant while taking this medicine. This medicine has caused ovarian failure in some women and reduced sperm counts in some men This medicine may interfere with the ability to have a child. Talk with your doctor or health care professional if you are concerned about your fertility. What side effects may I notice from receiving this medicine? Side effects that you should report to your doctor or health care professional as soon as possible: -allergic reactions like skin rash, itching or hives, swelling of the face, lips, or tongue -breathing problems -chest pain -fast or irregular heartbeat -low blood counts - this medicine may  decrease the number of white blood cells, red blood cells and platelets. You may be at increased risk for infections and bleeding. -pain, redness, or irritation at site where injected -signs of infection - fever or chills, cough, sore throat, pain or difficulty passing urine -signs of decreased platelets or bleeding - bruising, pinpoint red spots on the skin, black, tarry stools, blood in the urine -swelling of the ankles, feet, hands -tiredness -weakness Side effects that usually do not require medical attention (report to your doctor or health care professional if they continue or are bothersome): -diarrhea -hair loss -mouth sores -nail discoloration or damage -nausea -red colored urine -vomiting This list may not describe all possible side effects. Call your doctor for medical advice about side effects. You may report side effects to FDA at 1-800-FDA-1088. Where should I keep my medicine? This drug is given in a hospital or clinic and will not be stored at home. NOTE: This sheet is a summary. It may not cover all possible information. If you have questions about this medicine, talk to your doctor, pharmacist, or health care provider.  2018 Elsevier/Gold Standard (2015-12-14 11:28:51)   Cyclophosphamide (Cytoxan) injection What is this medicine? CYCLOPHOSPHAMIDE (sye kloe FOSS fa mide) is a chemotherapy drug. It slows the growth of cancer cells. This medicine is used to treat many types   cancer like lymphoma, myeloma, leukemia, breast cancer, and ovarian cancer, to name a few. This medicine may be used for other purposes; ask your health care provider or pharmacist if you have questions. COMMON BRAND NAME(S): Cytoxan, Neosar What should I tell my health care provider before I take this medicine? They need to know if you have any of these conditions: -blood disorders -history of other chemotherapy -infection -kidney disease -liver disease -recent or ongoing radiation  therapy -tumors in the bone marrow -an unusual or allergic reaction to cyclophosphamide, other chemotherapy, other medicines, foods, dyes, or preservatives -pregnant or trying to get pregnant -breast-feeding How should I use this medicine? This drug is usually given as an injection into a vein or muscle or by infusion into a vein. It is administered in a hospital or clinic by a specially trained health care professional. Talk to your pediatrician regarding the use of this medicine in children. Special care may be needed. Overdosage: If you think you have taken too much of this medicine contact a poison control center or emergency room at once. NOTE: This medicine is only for you. Do not share this medicine with others. What if I miss a dose? It is important not to miss your dose. Call your doctor or health care professional if you are unable to keep an appointment. What may interact with this medicine? This medicine may interact with the following medications: -amiodarone -amphotericin B -azathioprine -certain antiviral medicines for HIV or AIDS such as protease inhibitors (e.g., indinavir, ritonavir) and zidovudine -certain blood pressure medications such as benazepril, captopril, enalapril, fosinopril, lisinopril, moexipril, monopril, perindopril, quinapril, ramipril, trandolapril -certain cancer medications such as anthracyclines (e.g., daunorubicin, doxorubicin), busulfan, cytarabine, paclitaxel, pentostatin, tamoxifen, trastuzumab -certain diuretics such as chlorothiazide, chlorthalidone, hydrochlorothiazide, indapamide, metolazone -certain medicines that treat or prevent blood clots like warfarin -certain muscle relaxants such as succinylcholine -cyclosporine -etanercept -indomethacin -medicines to increase blood counts like filgrastim, pegfilgrastim, sargramostim -medicines used as general anesthesia -metronidazole -natalizumab This list may not describe all possible  interactions. Give your health care provider a list of all the medicines, herbs, non-prescription drugs, or dietary supplements you use. Also tell them if you smoke, drink alcohol, or use illegal drugs. Some items may interact with your medicine. What should I watch for while using this medicine? Visit your doctor for checks on your progress. This drug may make you feel generally unwell. This is not uncommon, as chemotherapy can affect healthy cells as well as cancer cells. Report any side effects. Continue your course of treatment even though you feel ill unless your doctor tells you to stop. Drink water or other fluids as directed. Urinate often, even at night. In some cases, you may be given additional medicines to help with side effects. Follow all directions for their use. Call your doctor or health care professional for advice if you get a fever, chills or sore throat, or other symptoms of a cold or flu. Do not treat yourself. This drug decreases your body's ability to fight infections. Try to avoid being around people who are sick. This medicine may increase your risk to bruise or bleed. Call your doctor or health care professional if you notice any unusual bleeding. Be careful brushing and flossing your teeth or using a toothpick because you may get an infection or bleed more easily. If you have any dental work done, tell your dentist you are receiving this medicine. You may get drowsy or dizzy. Do not drive, use machinery, or do anything that  needs mental alertness until you know how this medicine affects you. Do not become pregnant while taking this medicine or for 1 year after stopping it. Women should inform their doctor if they wish to become pregnant or think they might be pregnant. Men should not father a child while taking this medicine and for 4 months after stopping it. There is a potential for serious side effects to an unborn child. Talk to your health care professional or pharmacist for  more information. Do not breast-feed an infant while taking this medicine. This medicine may interfere with the ability to have a child. This medicine has caused ovarian failure in some women. This medicine has caused reduced sperm counts in some men. You should talk with your doctor or health care professional if you are concerned about your fertility. If you are going to have surgery, tell your doctor or health care professional that you have taken this medicine. What side effects may I notice from receiving this medicine? Side effects that you should report to your doctor or health care professional as soon as possible: -allergic reactions like skin rash, itching or hives, swelling of the face, lips, or tongue -low blood counts - this medicine may decrease the number of white blood cells, red blood cells and platelets. You may be at increased risk for infections and bleeding. -signs of infection - fever or chills, cough, sore throat, pain or difficulty passing urine -signs of decreased platelets or bleeding - bruising, pinpoint red spots on the skin, black, tarry stools, blood in the urine -signs of decreased red blood cells - unusually weak or tired, fainting spells, lightheadedness -breathing problems -dark urine -dizziness -palpitations -swelling of the ankles, feet, hands -trouble passing urine or change in the amount of urine -weight gain -yellowing of the eyes or skin Side effects that usually do not require medical attention (report to your doctor or health care professional if they continue or are bothersome): -changes in nail or skin color -hair loss -missed menstrual periods -mouth sores -nausea, vomiting This list may not describe all possible side effects. Call your doctor for medical advice about side effects. You may report side effects to FDA at 1-800-FDA-1088. Where should I keep my medicine? This drug is given in a hospital or clinic and will not be stored at  home. NOTE: This sheet is a summary. It may not cover all possible information. If you have questions about this medicine, talk to your doctor, pharmacist, or health care provider.  2018 Elsevier/Gold Standard (2012-08-31 16:22:58)

## 2018-01-30 ENCOUNTER — Ambulatory Visit
Admission: RE | Admit: 2018-01-30 | Discharge: 2018-01-30 | Disposition: A | Discharge status: Home / Self Care | Payer: BLUE CROSS/BLUE SHIELD | Source: Ambulatory Visit | Attending: Surgery | Admitting: Surgery

## 2018-01-30 ENCOUNTER — Encounter: Payer: Self-pay | Admitting: Hematology and Oncology

## 2018-01-30 ENCOUNTER — Telehealth: Payer: Self-pay | Admitting: Emergency Medicine

## 2018-01-30 DIAGNOSIS — R9389 Abnormal findings on diagnostic imaging of other specified body structures: Secondary | ICD-10-CM

## 2018-01-30 NOTE — Progress Notes (Signed)
Pt called inquiring about financial assistance.  After reviewing her chart, I enrolled her in the [employer] Complete program for [employer].  No other foundations has funding available for her Dx at this time.  I also informed her of the [employer] and went over what it covers.  She would like to apply so she will bring her proof of income on 01/31/18.  If approved I will give her an expense sheet.

## 2018-01-30 NOTE — Telephone Encounter (Signed)
TCT pt.  Reports mild nausea yesterday that was resolved with nausea medication.  Reports soft stools but no diarrhea.  Denies any other symptoms or concerns.  States she is eating and drinking like normal.  Verbalized understanding that she can all [place] with any questions or concerns.

## 2018-01-30 NOTE — Telephone Encounter (Signed)
-----   Message from Murvin Natal, RN sent at 01/29/2018 11:37 AM EDT ----- Regarding: pt of Dr. Lindi Adie first time chemo follow up call Pt of Dr. Lindi Adie first time Adriamycin and cytoxan follow up call.  Pt tolerated treatment well.

## 2018-01-31 ENCOUNTER — Encounter: Payer: Self-pay | Admitting: *Deleted

## 2018-01-31 ENCOUNTER — Inpatient Hospital Stay: Payer: BLUE CROSS/BLUE SHIELD

## 2018-01-31 VITALS — BP 113/62 | HR 82 | Temp 97.9°F | Resp 16

## 2018-01-31 DIAGNOSIS — C50211 Malignant neoplasm of upper-inner quadrant of right female breast: Secondary | ICD-10-CM

## 2018-01-31 DIAGNOSIS — Z170 Estrogen receptor positive status [ER+]: Principal | ICD-10-CM

## 2018-01-31 MED ORDER — PEGFILGRASTIM-CBQV 6 MG/0.6ML ~~LOC~~ SOSY
PREFILLED_SYRINGE | SUBCUTANEOUS | Status: AC
  Filled 2018-01-31: qty 0.6

## 2018-01-31 MED ORDER — GOSERELIN ACETATE 3.6 MG ~~LOC~~ IMPL
DRUG_IMPLANT | SUBCUTANEOUS | Status: AC
  Filled 2018-01-31: qty 3.6

## 2018-01-31 MED ORDER — GOSERELIN ACETATE 3.6 MG ~~LOC~~ IMPL: 3.6000 mg | DRUG_IMPLANT | Freq: Once | SUBCUTANEOUS | Status: DC

## 2018-01-31 MED ORDER — PEGFILGRASTIM-CBQV 6 MG/0.6ML ~~LOC~~ SOSY
6.0000 mg | PREFILLED_SYRINGE | Freq: Once | SUBCUTANEOUS | Status: AC
  Administered 2018-01-31: 6 mg via SUBCUTANEOUS

## 2018-02-01 ENCOUNTER — Telehealth: Payer: Self-pay | Admitting: Hematology and Oncology

## 2018-02-01 NOTE — Telephone Encounter (Signed)
02/01/2018 @ 10:30 am, spoke with patient to inform FMLA forms have been successfully faxed to [employer] at [phone]. Also mailed a copy per patient request.

## 2018-02-05 ENCOUNTER — Other Ambulatory Visit: Payer: BLUE CROSS/BLUE SHIELD

## 2018-02-05 ENCOUNTER — Inpatient Hospital Stay (HOSPITAL_BASED_OUTPATIENT_CLINIC_OR_DEPARTMENT_OTHER): Payer: BLUE CROSS/BLUE SHIELD | Admitting: Hematology and Oncology

## 2018-02-05 ENCOUNTER — Inpatient Hospital Stay: Payer: BLUE CROSS/BLUE SHIELD

## 2018-02-05 DIAGNOSIS — Z170 Estrogen receptor positive status [ER+]: Secondary | ICD-10-CM

## 2018-02-05 DIAGNOSIS — C50211 Malignant neoplasm of upper-inner quadrant of right female breast: Secondary | ICD-10-CM

## 2018-02-05 DIAGNOSIS — D649 Anemia, unspecified: Secondary | ICD-10-CM | POA: Diagnosis not present

## 2018-02-05 LAB — CBC WITH DIFFERENTIAL (CANCER CENTER ONLY)
BASOS PCT: 0 %
Basophils Absolute: 0 10*3/uL (ref 0.0–0.1)
Eosinophils Absolute: 0.1 10*3/uL (ref 0.0–0.5)
Eosinophils Relative: 1 %
HEMATOCRIT: 33 % — AB (ref 34.8–46.6)
HEMOGLOBIN: 10.9 g/dL — AB (ref 11.6–15.9)
LYMPHS ABS: 0.9 10*3/uL (ref 0.9–3.3)
LYMPHS PCT: 24 %
MCH: 22 pg — ABNORMAL LOW (ref 25.1–34.0)
MCHC: 33 g/dL (ref 31.5–36.0)
MCV: 66.7 fL — AB (ref 79.5–101.0)
MONO ABS: 0.3 10*3/uL (ref 0.1–0.9)
MONOS PCT: 8 %
NEUTROS ABS: 2.4 10*3/uL (ref 1.5–6.5)
NEUTROS PCT: 67 %
Platelet Count: 255 10*3/uL (ref 145–400)
RBC: 4.95 MIL/uL (ref 3.70–5.45)
RDW: 15.2 % — ABNORMAL HIGH (ref 11.2–14.5)
WBC Count: 3.6 10*3/uL — ABNORMAL LOW (ref 3.9–10.3)

## 2018-02-05 LAB — CMP (CANCER CENTER ONLY)
ALBUMIN: 3.7 g/dL (ref 3.5–5.0)
ALK PHOS: 88 U/L (ref 40–150)
ALT: 12 U/L (ref 0–55)
ANION GAP: 10 (ref 3–11)
AST: 12 U/L (ref 5–34)
BUN: 9 mg/dL (ref 7–26)
CALCIUM: 9.5 mg/dL (ref 8.4–10.4)
CO2: 29 mmol/L (ref 22–29)
Chloride: 100 mmol/L (ref 98–109)
Creatinine: 0.8 mg/dL (ref 0.60–1.10)
GFR, Est AFR Am: >60 mL/min (ref >60)
Glucose, Bld: 122 mg/dL (ref 70–140)
Potassium: 3.7 mmol/L (ref 3.5–5.1)
Sodium: 139 mmol/L (ref 136–145)
TOTAL PROTEIN: 7.1 g/dL (ref 6.4–8.3)

## 2018-02-05 NOTE — Assessment & Plan Note (Signed)
01/10/2018:Palpable right breast mass with calcifications medial right breast at 1 o'clock position: 2.6 cm by ultrasound, 1 abnormal lymph node: Ultrasound biopsy of both the mass and the lymph node revealed grade 3 invasive ductal carcinoma ER 90%, PR 2%, Ki-67 70%, HER-2 negative ratio 0.96, T2 N1 stage II a AJCC 8 clinical stage  Recommendation: 1 Genetic testing.Staging scans 2.neoadjuvant chemotherapy with dose dense Adriamycin and Cytoxan followed by Taxol weekly x12 3.Followed by surgery depending on genetic test results and patient's preference 4.Followed by adjuvant radiation 5.Followed by antiestrogen therapy and evaluation on Natalee clinical trial ------------------------------------------------------------------------ Current treatment: Cycle 1 day 8 dose dense Adriamycin Cytoxan Echocardiogram 01/25/2018: EF 65 to 70%  Chemo toxicities:  Labs are reviewed Monitoring closely for chemo toxicities  Return to clinic In 1 week for cycle 2

## 2018-02-05 NOTE — Progress Notes (Signed)
Patient Care Team: [facility] as PCP - General Nicholas Lose, MD as Consulting Physician (Hematology and Oncology) Alphonsa Overall, MD as Consulting Physician (General Surgery) Gery Pray, MD as Consulting Physician (Radiation Oncology)  DIAGNOSIS:  Encounter Diagnosis  Name Primary?  . Malignant neoplasm of upper-inner quadrant of right breast in female, estrogen receptor positive ([place])     SUMMARY OF ONCOLOGIC HISTORY:   Malignant neoplasm of upper-inner quadrant of right breast in female, estrogen receptor positive ([place])   01/10/2018 Initial Diagnosis    Palpable right breast mass with calcifications medial right breast at 1 o'clock position: 2.6 cm by ultrasound, 1 abnormal lymph node: Ultrasound biopsy of both the mass and the lymph node revealed grade 3 invasive ductal carcinoma ER 90%, PR 2%, Ki-67 70%, HER-2 negative ratio 0.96, T2 N1 stage II a AJCC 8 clinical stage      01/29/2018 -  Neo-Adjuvant Chemotherapy    Dose dense Adriamycin and Cytoxan followed by Taxol x12       CHIEF COMPLIANT: Cycle 1 day 1 dose dense Adriamycin and Cytoxan  INTERVAL HISTORY: Kim Griffin is a 22 year old with above-mentioned history of right breast cancer who received her first cycle of chemotherapy with dose dense Adriamycin and Cytoxan last week.  He is here for toxicity evaluation.  She tolerated chemotherapy extremely well.  She did not have any nausea or vomiting.  Denies any fevers or chills or mouth sores.  She denies any fatigue as well.  REVIEW OF SYSTEMS:   Constitutional: Denies fevers, chills or abnormal weight loss Eyes: Denies blurriness of vision Ears, nose, mouth, throat, and face: Denies mucositis or sore throat Respiratory: Denies cough, dyspnea or wheezes Cardiovascular: Denies palpitation, chest discomfort Gastrointestinal:  Denies nausea, heartburn or change in bowel habits Skin: Denies abnormal skin rashes Lymphatics: Denies new  lymphadenopathy or easy bruising Neurological:Denies numbness, tingling or new weaknesses Behavioral/Psych: Mood is stable, no new changes  Extremities: No lower extremity edema Breast:  denies any pain or lumps or nodules in either breasts All other systems were reviewed with the patient and are negative.  I have reviewed the past medical history, past surgical history, social history and family history with the patient and they are unchanged from previous note.  ALLERGIES:  has No Known Allergies.  MEDICATIONS:  Current Outpatient Medications  Medication Sig Dispense Refill  . dexamethasone (DECADRON) 4 MG tablet Take 1 tab day after chemo and 1 tab 2 days after chemo with food 10 tablet 0  . ferrous sulfate 325 (65 FE) MG tablet Take 325 mg by mouth daily with breakfast.    . HYDROcodone-acetaminophen (NORCO/VICODIN) 5-325 MG tablet Take 1 tablet by mouth every 6 (six) hours as needed for moderate pain. 20 tablet 0  . ibuprofen (ADVIL,MOTRIN) 800 MG tablet Take 800 mg by mouth every 8 (eight) hours as needed for mild pain or moderate pain.    Marland Kitchen lidocaine-prilocaine (EMLA) cream Apply to affected area once 30 g 3  . LORazepam (ATIVAN) 0.5 MG tablet Take 1 tablet (0.5 mg total) by mouth at bedtime as needed for sleep. 30 tablet 0  . ondansetron (ZOFRAN) 8 MG tablet Take 1 tablet (8 mg total) by mouth 2 (two) times daily as needed. Start on the third day after chemotherapy. 30 tablet 1  . prochlorperazine (COMPAZINE) 10 MG tablet Take 1 tablet (10 mg total) by mouth every 6 (six) hours as needed (Nausea or vomiting). 30 tablet 1   No  current facility-administered medications for this visit.     PHYSICAL EXAMINATION: ECOG PERFORMANCE STATUS: 1 - Symptomatic but completely ambulatory  Vitals:   02/05/18 1422  BP: 112/69  Pulse: 93  Resp: 20  Temp: 98.3 F (36.8 C)  SpO2: 100%   Filed Weights   02/05/18 1422  Weight: 124 lb 6.4 oz (56.4 kg)    GENERAL:alert, no distress and  comfortable SKIN: skin color, texture, turgor are normal, no rashes or significant lesions EYES: normal, Conjunctiva are pink and non-injected, sclera clear OROPHARYNX:no exudate, no erythema and lips, buccal mucosa, and tongue normal  NECK: supple, thyroid normal size, non-tender, without nodularity LYMPH:  no palpable lymphadenopathy in the cervical, axillary or inguinal LUNGS: clear to auscultation and percussion with normal breathing effort HEART: regular rate & rhythm and no murmurs and no lower extremity edema ABDOMEN:abdomen soft, non-tender and normal bowel sounds MUSCULOSKELETAL:no cyanosis of digits and no clubbing  NEURO: alert & oriented x 3 with fluent speech, no focal motor/sensory deficits EXTREMITIES: No lower extremity edema BREAST: No palpable masses or nodules in either right or left breasts. No palpable axillary supraclavicular or infraclavicular adenopathy no breast tenderness or nipple discharge. (exam performed in the presence of a chaperone)  LABORATORY DATA:  I have reviewed the data as listed CMP Latest Ref Rng & Units 01/29/2018 01/17/2018 01/13/2011  Glucose 70 - 140 mg/dL 143(H) 102 148(H)  BUN 7 - 26 mg/dL 12 11 6   Creatinine 0.60 - 1.10 mg/dL 0.78 0.81 0.87  Sodium 136 - 145 mmol/L 138 141 132(L)  Potassium 3.5 - 5.1 mmol/L 3.4(L) 4.2 3.3(L)  Chloride 98 - 109 mmol/L 103 103 99  CO2 22 - 29 mmol/L 25 28 23   Calcium 8.4 - 10.4 mg/dL 9.2 9.8 8.7  Total Protein 6.4 - 8.3 g/dL 7.0 8.1 -  Total Bilirubin 0.2 - 1.2 mg/dL 0.3 0.2 -  Alkaline Phos 40 - 150 U/L 68 70 -  AST 5 - 34 U/L 12 15 -  ALT 0 - 55 U/L 11 14 -    Lab Results  Component Value Date   WBC 3.6 (L) 02/05/2018   HGB 13.1 01/13/2011   HCT 33.0 (L) 02/05/2018   MCV 66.7 (L) 02/05/2018   PLT 255 02/05/2018   NEUTROABS 2.4 02/05/2018    ASSESSMENT & PLAN:  Malignant neoplasm of upper-inner quadrant of right breast in female, estrogen receptor positive ([place]) 01/10/2018:Palpable right breast  mass with calcifications medial right breast at 1 o'clock position: 2.6 cm by ultrasound, 1 abnormal lymph node: Ultrasound biopsy of both the mass and the lymph node revealed grade 3 invasive ductal carcinoma ER 90%, PR 2%, Ki-67 70%, HER-2 negative ratio 0.96, T2 N1 stage II a AJCC 8 clinical stage  Recommendation: 1 Genetic testing.Staging scans 2.neoadjuvant chemotherapy with dose dense Adriamycin and Cytoxan followed by Taxol weekly x12 3.Followed by surgery depending on genetic test results and patient's preference 4.Followed by adjuvant radiation 5.Followed by antiestrogen therapy and evaluation on Natalee clinical trial ------------------------------------------------------------------------ Current treatment: Cycle 1 day 8 dose dense Adriamycin Cytoxan Echocardiogram 01/25/2018: EF 65 to 70%  Chemo toxicities: Denies any nausea or vomiting. Denies any fatigue. In fact patient has tolerated chemotherapy extremely well.  Microcytic anemia: We will check iron studies as well as hemoglobin electrophoresis.  Patient tells me that she has a sickle cell trait.  Patient needs an MRI guided biopsy of the abnormality noted on the MRI. Labs are reviewed Monitoring closely for chemo toxicities  Return to  clinic In 1 week for cycle 2  Orders Placed This Encounter  Procedures  . Iron and TIBC    Standing Status:   Future    Standing Expiration Date:   02/05/2019  . Ferritin    Standing Status:   Future    Standing Expiration Date:   02/05/2019  . Hemoglobinopathy evaluation    Standing Status:   Future    Standing Expiration Date:   02/05/2019   The patient has a good understanding of the overall plan. she agrees with it. she will call with any problems that may develop before the next visit here.   Harriette Ohara, MD 02/05/18

## 2018-02-07 ENCOUNTER — Other Ambulatory Visit: Payer: Self-pay | Admitting: Internal Medicine

## 2018-02-07 ENCOUNTER — Other Ambulatory Visit: Payer: Self-pay | Admitting: Surgery

## 2018-02-07 DIAGNOSIS — C50911 Malignant neoplasm of unspecified site of right female breast: Secondary | ICD-10-CM

## 2018-02-08 ENCOUNTER — Encounter: Payer: Self-pay | Admitting: Genetic Counselor

## 2018-02-08 ENCOUNTER — Ambulatory Visit: Payer: Self-pay | Admitting: Genetic Counselor

## 2018-02-08 DIAGNOSIS — Z1379 Encounter for other screening for genetic and chromosomal anomalies: Secondary | ICD-10-CM | POA: Insufficient documentation

## 2018-02-08 NOTE — Progress Notes (Signed)
[facility]       Genetic Test Results    Patient Name: Kim Griffin Patient DOB: 1996-03-17 Patient Age: 22 y.o. Encounter Date: 02/08/2018  Referring Provider: Nicholas Lose, MD  Primary Care Provider: [facility]   Ms. Frankum was called today to discuss genetic test results. Please see the Genetics note from her visit on 01/18/2017 for a detailed discussion of her personal and family history.  Genetic Testing: At the time of Ms. Labine visit, she decided to pursue genetic testing of multiple genes associated with hereditary susceptibility to cancer. Testing included sequencing and deletion/duplication analysis. Testing did not reveal a pathogenic mutation in any of the genes analyzed.  A copy of the genetic test report will be scanned into Epic under the Media tab.  The genes analyzed were the 83 genes on [facility]'s Multi-Cancer panel (ALK, APC, ATM, AXIN2, BAP1, BARD1, BLM, BMPR1A, BRCA1, BRCA2, BRIP1, CASR, CDC73, CDH1, CDK4, CDKN1B, CDKN1C, CDKN2A, CEBPA, CHEK2, CTNNA1, DICER1, DIS3L2, EGFR, EPCAM, FH, FLCN, GATA2, GPC3, GREM1, HOXB13, HRAS, KIT, MAX, MEN1, MET, MITF, MLH1, MSH2, MSH3, MSH6, MUTYH, NBN, NF1, NF2, NTHL1, PALB2, PDGFRA, PHOX2B, PMS2, POLD1, POLE, POT1, PRKAR1A, PTCH1, PTEN, RAD50, RAD51C, RAD51D, RB1, RECQL4, RET, RUNX1, SDHA, SDHAF2, SDHB, SDHC, SDHD, SMAD4, SMARCA4, SMARCB1, SMARCE1, STK11, SUFU, TERC, TERT, TMEM127, TP53, TSC1, TSC2, VHL, WRN, WT1).  Since the current test is not perfect, it is possible that there may be a gene mutation that current testing cannot detect, but that chance is small. It is possible that a different genetic factor, which has not yet been discovered or is not on this panel, is responsible for Ms. Grego breast cancer. No additional testing is recommended at this time for Ms. Botto.  A Variant of Uncertain Significance was detected: CDK4 [id number] (Silent). This is still considered a  normal result. While at this time, it is unknown if this finding is associated with increased cancer risk, the majority of these variants get reclassified to be inconsequential. Medical management should not be based on this finding. With time, we suspect the lab will determine the significance, if any. If we do learn more about it, we will try to contact Ms. Keim to discuss it further. It is important to stay in touch with [place] periodically and keep the address and phone number up to date.  Cancer Screening: Given the young age of Ms. Deschepper' breast cancer, we must interpret these negative results with some caution. Cancer screenings will be deferred to her providers  Family Members: Given the very young age of breast cancer in Ms. Leckey (age 8), women are recommended to speak with their own providers about when to initiate breast cancer screening.  Any relative who had cancer at a young age or had a particularly rare cancer may also wish to pursue genetic testing. Genetic counselors can be located in other cities, by visiting the website of the [employer] ([email or web address]) and Field seismologist for a Dietitian by zip code.   Family members are not recommended to get tested for the above VUS outside of a research protocol as this finding has no implications for their medical management.    Lastly, cancer genetics is a rapidly advancing field and it is possible that new genetic tests will be appropriate for Ms. Serres in the future. We encourage her to remain in contact with [place] on an annual  basis so we can update her personal and family histories, and let her know of advances in cancer genetics that may benefit the family. Our contact number was provided. Ms. Hangartner is welcome to call anytime with additional questions.     Steele Berg, MS, [place] Certified Genetic Counselor phone: [phone]

## 2018-02-09 ENCOUNTER — Ambulatory Visit
Admission: RE | Admit: 2018-02-09 | Discharge: 2018-02-09 | Disposition: A | Discharge status: Home / Self Care | Payer: BLUE CROSS/BLUE SHIELD | Source: Ambulatory Visit | Attending: Surgery | Admitting: Surgery

## 2018-02-09 DIAGNOSIS — C50911 Malignant neoplasm of unspecified site of right female breast: Secondary | ICD-10-CM

## 2018-02-09 MED ORDER — GADOBENATE DIMEGLUMINE 529 MG/ML IV SOLN
11.0000 mL | Freq: Once | INTRAVENOUS | Status: AC | PRN
  Administered 2018-02-09: 11 mL via INTRAVENOUS

## 2018-02-12 ENCOUNTER — Inpatient Hospital Stay: Payer: BLUE CROSS/BLUE SHIELD

## 2018-02-12 ENCOUNTER — Inpatient Hospital Stay (HOSPITAL_BASED_OUTPATIENT_CLINIC_OR_DEPARTMENT_OTHER): Payer: BLUE CROSS/BLUE SHIELD | Admitting: Hematology and Oncology

## 2018-02-12 ENCOUNTER — Encounter: Payer: Self-pay | Admitting: *Deleted

## 2018-02-12 DIAGNOSIS — D649 Anemia, unspecified: Secondary | ICD-10-CM | POA: Diagnosis not present

## 2018-02-12 DIAGNOSIS — Z170 Estrogen receptor positive status [ER+]: Secondary | ICD-10-CM | POA: Diagnosis not present

## 2018-02-12 DIAGNOSIS — C50211 Malignant neoplasm of upper-inner quadrant of right female breast: Secondary | ICD-10-CM

## 2018-02-12 DIAGNOSIS — Z95828 Presence of other vascular implants and grafts: Secondary | ICD-10-CM

## 2018-02-12 LAB — CMP (CANCER CENTER ONLY)
ALBUMIN: 3.7 g/dL (ref 3.5–5.0)
ALT: 20 U/L (ref 0–55)
AST: 12 U/L (ref 5–34)
Alkaline Phosphatase: 99 U/L (ref 40–150)
Anion gap: 8 (ref 3–11)
BUN: 11 mg/dL (ref 7–26)
CHLORIDE: 107 mmol/L (ref 98–109)
CO2: 28 mmol/L (ref 22–29)
CREATININE: 0.81 mg/dL (ref 0.60–1.10)
Calcium: 9.3 mg/dL (ref 8.4–10.4)
GFR, Est AFR Am: >60 mL/min (ref >60)
GLUCOSE: 121 mg/dL (ref 70–140)
POTASSIUM: 3.7 mmol/L (ref 3.5–5.1)
Sodium: 143 mmol/L (ref 136–145)
Total Protein: 7.1 g/dL (ref 6.4–8.3)

## 2018-02-12 LAB — CBC WITH DIFFERENTIAL (CANCER CENTER ONLY)
BASOS ABS: 0 10*3/uL (ref 0.0–0.1)
Basophils Relative: 1 %
EOS PCT: 0 %
Eosinophils Absolute: 0 10*3/uL (ref 0.0–0.5)
HEMATOCRIT: 33.7 % — AB (ref 34.8–46.6)
Hemoglobin: 11.1 g/dL — ABNORMAL LOW (ref 11.6–15.9)
LYMPHS ABS: 1.2 10*3/uL (ref 0.9–3.3)
LYMPHS PCT: 17 %
MCH: 22.2 pg — ABNORMAL LOW (ref 25.1–34.0)
MCHC: 32.9 g/dL (ref 31.5–36.0)
MCV: 67.5 fL — AB (ref 79.5–101.0)
Monocytes Absolute: 0.5 10*3/uL (ref 0.1–0.9)
Monocytes Relative: 7 %
NEUTROS PCT: 75 %
NRBC: 1 /100{WBCs} — AB
Neutro Abs: 5.3 10*3/uL (ref 1.5–6.5)
PLATELETS: 144 10*3/uL — AB (ref 145–400)
RBC: 4.99 MIL/uL (ref 3.70–5.45)
RDW: 16.5 % — AB (ref 11.2–14.5)
WBC: 6.9 10*3/uL (ref 3.9–10.3)

## 2018-02-12 LAB — IRON AND TIBC
Iron: 39 ug/dL — ABNORMAL LOW (ref 41–142)
Saturation Ratios: 13 % — ABNORMAL LOW (ref 21–57)
TIBC: 308 ug/dL (ref 236–444)
UIBC: 268 ug/dL

## 2018-02-12 LAB — FERRITIN: FERRITIN: 52 ng/mL (ref 9–269)

## 2018-02-12 MED ORDER — SODIUM CHLORIDE 0.9% FLUSH
10.0000 mL | INTRAVENOUS | Status: DC | PRN
  Administered 2018-02-12: 10 mL via INTRAVENOUS
  Filled 2018-02-12: qty 10

## 2018-02-12 MED ORDER — SODIUM CHLORIDE 0.9% FLUSH
10.0000 mL | INTRAVENOUS | Status: DC | PRN
  Administered 2018-02-12: 10 mL
  Filled 2018-02-12: qty 10

## 2018-02-12 MED ORDER — SODIUM CHLORIDE 0.9% FLUSH
10.0000 mL | INTRAVENOUS | Status: DC | PRN
  Filled 2018-02-12: qty 10

## 2018-02-12 MED ORDER — PALONOSETRON HCL INJECTION 0.25 MG/5ML
INTRAVENOUS | Status: AC
  Filled 2018-02-12: qty 5

## 2018-02-12 MED ORDER — SODIUM CHLORIDE 0.9 % IV SOLN
Freq: Once | INTRAVENOUS | Status: AC
  Administered 2018-02-12: 16:00:00 via INTRAVENOUS
  Filled 2018-02-12: qty 5

## 2018-02-12 MED ORDER — DOXORUBICIN HCL CHEMO IV INJECTION 2 MG/ML
60.0000 mg/m2 | Freq: Once | INTRAVENOUS | Status: AC
  Administered 2018-02-12: 94 mg via INTRAVENOUS
  Filled 2018-02-12: qty 47

## 2018-02-12 MED ORDER — HEPARIN SOD (PORK) LOCK FLUSH 100 UNIT/ML IV SOLN
500.0000 [IU] | Freq: Once | INTRAVENOUS | Status: AC | PRN
  Administered 2018-02-12: 500 [IU]
  Filled 2018-02-12: qty 5

## 2018-02-12 MED ORDER — SODIUM CHLORIDE 0.9 % IV SOLN
600.0000 mg/m2 | Freq: Once | INTRAVENOUS | Status: AC
  Administered 2018-02-12: 940 mg via INTRAVENOUS
  Filled 2018-02-12: qty 47

## 2018-02-12 MED ORDER — PALONOSETRON HCL INJECTION 0.25 MG/5ML
0.2500 mg | Freq: Once | INTRAVENOUS | Status: AC
  Administered 2018-02-12: 0.25 mg via INTRAVENOUS

## 2018-02-12 MED ORDER — SODIUM CHLORIDE 0.9 % IV SOLN
Freq: Once | INTRAVENOUS | Status: AC
  Administered 2018-02-12: 15:00:00 via INTRAVENOUS

## 2018-02-12 NOTE — Patient Instructions (Signed)
[facility] Discharge Instructions for Patients Receiving Chemotherapy  Today you received the following chemotherapy agents Adriamycin, Cytoxan.  To help prevent nausea and vomiting after your treatment, we encourage you to take your nausea medication as prescribed.   If you develop nausea and vomiting that is not controlled by your nausea medication, call the clinic.   BELOW ARE SYMPTOMS THAT SHOULD BE REPORTED IMMEDIATELY:  *FEVER GREATER THAN 100.5 F  *CHILLS WITH OR WITHOUT FEVER  NAUSEA AND VOMITING THAT IS NOT CONTROLLED WITH YOUR NAUSEA MEDICATION  *UNUSUAL SHORTNESS OF BREATH  *UNUSUAL BRUISING OR BLEEDING  TENDERNESS IN MOUTH AND THROAT WITH OR WITHOUT PRESENCE OF ULCERS  *URINARY PROBLEMS  *BOWEL PROBLEMS  UNUSUAL RASH Items with * indicate a potential emergency and should be followed up as soon as possible.  Feel free to call the clinic should you have any questions or concerns. The clinic phone number is [phone].  Please show the CHEMO ALERT CARD at check-in to the Emergency Department and triage nurse.   

## 2018-02-12 NOTE — Assessment & Plan Note (Signed)
01/10/2018:Palpable right breast mass with calcifications medial right breast at 1 o'clock position: 2.6 cm by ultrasound, 1 abnormal lymph node: Ultrasound biopsy of both the mass and the lymph node revealed grade 3 invasive ductal carcinoma ER 90%, PR 2%, Ki-67 70%, HER-2 negative ratio 0.96, T2 N1 stage II a AJCC 8 clinical stage  Recommendation: 1 Genetic testing.Staging scans 2.neoadjuvant chemotherapy with dose dense Adriamycin and Cytoxan followed by Taxol weekly x12 3.Followed by surgery depending on genetic test results and patient's preference 4.Followed by adjuvant radiation 5.Followed by antiestrogen therapy and evaluation on Natalee clinical trial ------------------------------------------------------------------------ Current treatment: Cycle 2 day 1 dose dense Adriamycin Cytoxan Echocardiogram3/28/2019: Bellicus.Kim Griffin to 70%  Chemo toxicities: Denies any nausea or vomiting. Denies any fatigue. In fact patient has tolerated chemotherapy extremely well.  Microcytic anemia: We will check iron studies as well as hemoglobin electrophoresis.  Patient tells me that she has a sickle cell trait.  Patient needs an MRI guided biopsy of the abnormality noted on the MRI. Labs are reviewed Monitoring closely for chemo toxicities  Return to clinic In 2 weeks for cycle 3

## 2018-02-12 NOTE — Progress Notes (Signed)
Patient Care Team: [facility] as PCP - General Kim Lose, MD as Consulting Physician (Hematology and Oncology) Alphonsa Overall, MD as Consulting Physician (General Surgery) Gery Pray, MD as Consulting Physician (Radiation Oncology)  DIAGNOSIS:  Encounter Diagnosis  Name Primary?  . Malignant neoplasm of upper-inner quadrant of right breast in female, estrogen receptor positive ([place])     SUMMARY OF ONCOLOGIC HISTORY:   Malignant neoplasm of upper-inner quadrant of right breast in female, estrogen receptor positive ([place])   01/10/2018 Initial Diagnosis    Palpable right breast mass with calcifications medial right breast at 1 o'clock position: 2.6 cm by ultrasound, 1 abnormal lymph node: Ultrasound biopsy of both the mass and the lymph node revealed grade 3 invasive ductal carcinoma ER 90%, PR 2%, Ki-67 70%, HER-2 negative ratio 0.96, T2 N1 stage II a AJCC 8 clinical stage      01/29/2018 -  Neo-Adjuvant Chemotherapy    Dose dense Adriamycin and Cytoxan followed by Taxol x12       CHIEF COMPLIANT: Cycle 2 dose dense Adriamycin and Cytoxan  INTERVAL HISTORY: Kim Griffin is a 22 year old with above-mentioned history of right breast cancer who is here to receive her second cycle of dose dense Adriamycin and Cytoxan.  She tolerated first cycle of chemotherapy extremely well.  She did not have any nausea or vomiting.  Denies any fevers chills.  REVIEW OF SYSTEMS:   Constitutional: Denies fevers, chills or abnormal weight loss Eyes: Denies blurriness of vision Ears, nose, mouth, throat, and face: Denies mucositis or sore throat Respiratory: Denies cough, dyspnea or wheezes Cardiovascular: Denies palpitation, chest discomfort Gastrointestinal:  Denies nausea, heartburn or change in bowel habits Skin: Denies abnormal skin rashes Lymphatics: Denies new lymphadenopathy or easy bruising Neurological:Denies numbness, tingling or new  weaknesses Behavioral/Psych: Mood is stable, no new changes  Extremities: No lower extremity edema  All other systems were reviewed with the patient and are negative.  I have reviewed the past medical history, past surgical history, social history and family history with the patient and they are unchanged from previous note.  ALLERGIES:  has No Known Allergies.  MEDICATIONS:  Current Outpatient Medications  Medication Sig Dispense Refill  . dexamethasone (DECADRON) 4 MG tablet Take 1 tab day after chemo and 1 tab 2 days after chemo with food 10 tablet 0  . ferrous sulfate 325 (65 FE) MG tablet Take 325 mg by mouth daily with breakfast.    . HYDROcodone-acetaminophen (NORCO/VICODIN) 5-325 MG tablet Take 1 tablet by mouth every 6 (six) hours as needed for moderate pain. 20 tablet 0  . ibuprofen (ADVIL,MOTRIN) 800 MG tablet Take 800 mg by mouth every 8 (eight) hours as needed for mild pain or moderate pain.    Marland Kitchen lidocaine-prilocaine (EMLA) cream Apply to affected area once 30 g 3  . LORazepam (ATIVAN) 0.5 MG tablet Take 1 tablet (0.5 mg total) by mouth at bedtime as needed for sleep. 30 tablet 0  . ondansetron (ZOFRAN) 8 MG tablet Take 1 tablet (8 mg total) by mouth 2 (two) times daily as needed. Start on the third day after chemotherapy. 30 tablet 1  . prochlorperazine (COMPAZINE) 10 MG tablet Take 1 tablet (10 mg total) by mouth every 6 (six) hours as needed (Nausea or vomiting). 30 tablet 1   No current facility-administered medications for this visit.    Facility-Administered Medications Ordered in Other Visits  Medication Dose Route Frequency Provider Last Rate Last Dose  . 0.9 %  sodium chloride infusion   Intravenous Once Kim Lose, MD      . cyclophosphamide (CYTOXAN) 940 mg in sodium chloride 0.9 % 250 mL chemo infusion  600 mg/m2 (Treatment Plan Recorded) Intravenous Once Kim Lose, MD      . DOXOrubicin (ADRIAMYCIN) chemo injection 94 mg  60 mg/m2 (Treatment Plan Recorded)  Intravenous Once Kim Lose, MD      . fosaprepitant (EMEND) 150 mg, dexamethasone (DECADRON) 12 mg in sodium chloride 0.9 % 145 mL IVPB   Intravenous Once Kim Lose, MD      . heparin lock flush 100 unit/mL  500 Units Intracatheter Once PRN Kim Lose, MD      . palonosetron (ALOXI) injection 0.25 mg  0.25 mg Intravenous Once Kim Lose, MD      . sodium chloride flush (NS) 0.9 % injection 10 mL  10 mL Intracatheter PRN Kim Lose, MD        PHYSICAL EXAMINATION: ECOG PERFORMANCE STATUS: 1 - Symptomatic but completely ambulatory  Vitals:   02/12/18 1407  BP: 113/64  Pulse: 88  Resp: 20  Temp: 98.3 F (36.8 C)  SpO2: 100%   Filed Weights   02/12/18 1407  Weight: 124 lb 1.6 oz (56.3 kg)    GENERAL:alert, no distress and comfortable SKIN: skin color, texture, turgor are normal, no rashes or significant lesions EYES: normal, Conjunctiva are pink and non-injected, sclera clear OROPHARYNX:no exudate, no erythema and lips, buccal mucosa, and tongue normal  NECK: supple, thyroid normal size, non-tender, without nodularity LYMPH:  no palpable lymphadenopathy in the cervical, axillary or inguinal LUNGS: clear to auscultation and percussion with normal breathing effort HEART: regular rate & rhythm and no murmurs and no lower extremity edema ABDOMEN:abdomen soft, non-tender and normal bowel sounds MUSCULOSKELETAL:no cyanosis of digits and no clubbing  NEURO: alert & oriented x 3 with fluent speech, no focal motor/sensory deficits EXTREMITIES: No lower extremity edema   LABORATORY DATA:  I have reviewed the data as listed CMP Latest Ref Rng & Units 02/12/2018 02/05/2018 01/29/2018  Glucose 70 - 140 mg/dL 121 122 143(H)  BUN 7 - 26 mg/dL _0 Creatinine 0.60 - 1.10 mg/dL 0.81 0.80 0.78  Sodium 136 - 145 mmol/L 143 139 138  Potassium 3.5 - 5.1 mmol/L 3.7 3.7 3.4(L)  Chloride 98 - 109 mmol/L 107 100 103  CO2 22 - 29 mmol/L _1 Calcium 8.4 - 10.4 mg/dL 9.3 9.5 9.2   Total Protein 6.4 - 8.3 g/dL 7.1 7.1 7.0  Total Bilirubin 0.2 - 1.2 mg/dL <0.2(L) <0.2(L) 0.3  Alkaline Phos 40 - 150 U/L 99 88 68  AST 5 - 34 U/L _2 ALT 0 - 55 U/L _3 Lab Results  Component Value Date   WBC 6.9 02/12/2018   HGB 13.1 01/13/2011   HCT 33.7 (L) 02/12/2018   MCV 67.5 (L) 02/12/2018   PLT 144 (L) 02/12/2018   NEUTROABS 5.3 02/12/2018    ASSESSMENT & PLAN:  Malignant neoplasm of upper-inner quadrant of right breast in female, estrogen receptor positive ([place]) 01/10/2018:Palpable right breast mass with calcifications medial right breast at 1 o'clock position: 2.6 cm by ultrasound, 1 abnormal lymph node: Ultrasound biopsy of both the mass and the lymph node revealed grade 3 invasive ductal carcinoma ER 90%, PR 2%, Ki-67 70%, HER-2 negative ratio 0.96, T2 N1 stage II a AJCC 8 clinical stage  Recommendation: 1 Genetic testing.Staging scans 2.neoadjuvant chemotherapy with dose  dense Adriamycin and Cytoxan followed by Taxol weekly x12 3.Followed by surgery depending on genetic test results and patient's preference 4.Followed by adjuvant radiation 5.Followed by antiestrogen therapy and evaluation on Natalee clinical trial ------------------------------------------------------------------------ Current treatment: Cycle 2 day 1 dose dense Adriamycin Cytoxan Echocardiogram3/28/2019: Bellicus.Nancy to 70%  Chemo toxicities: Denies any nausea or vomiting. Denies any fatigue. In fact patient has tolerated chemotherapy extremely well.  Microcytic anemia: We will check iron studies as well as hemoglobin electrophoresis.  Patient tells me that she has a sickle cell trait. MRI biopsy right breast 9 o'clock position: ADH, intraductal papilloma  Labs are reviewed: Platelet count is 136 Monitoring closely for chemo toxicities  Return to clinic In 2 weeks for cycle 3    No orders of the defined types were placed in this encounter.  The patient has a good  understanding of the overall plan. she agrees with it. she will call with any problems that may develop before the next visit here.   Harriette Ohara, MD 02/12/18

## 2018-02-13 ENCOUNTER — Encounter: Payer: Self-pay | Admitting: Hematology and Oncology

## 2018-02-13 NOTE — Progress Notes (Signed)
Called patient to introduce myself as Arboriculturist and to advise [facility] has funds available for her diagnosis. Asked for her gross income to apply for copay assistance. She called me back with this information.  Applied online on her behalf.  Patient approved for up to $4,000  02/13/18 - 02/14/19 with a look-back period of 04/15/19. Her guaranteed amount is $2500 with access to an additional $5852 without reapplication or paperwork. Obtained physician signature on physician form. Faxed to [facility] and uploaded on the portal as well. Fax received ok per confirmation sheet.

## 2018-02-14 ENCOUNTER — Inpatient Hospital Stay: Payer: BLUE CROSS/BLUE SHIELD

## 2018-02-14 DIAGNOSIS — C50211 Malignant neoplasm of upper-inner quadrant of right female breast: Secondary | ICD-10-CM | POA: Diagnosis not present

## 2018-02-14 DIAGNOSIS — Z170 Estrogen receptor positive status [ER+]: Principal | ICD-10-CM

## 2018-02-14 LAB — HEMOGLOBINOPATHY EVALUATION
HGB A: 65.6 % — AB (ref 96.4–98.8)
HGB C: 0 %
HGB S QUANTITAION: 30.6 % — AB
HGB VARIANT: 0 %
Hgb A2 Quant: 3.8 % — ABNORMAL HIGH (ref 1.8–3.2)
Hgb F Quant: 0 % (ref 0.0–2.0)

## 2018-02-14 MED ORDER — PEGFILGRASTIM-CBQV 6 MG/0.6ML ~~LOC~~ SOSY
PREFILLED_SYRINGE | SUBCUTANEOUS | Status: AC
  Filled 2018-02-14: qty 0.6

## 2018-02-14 MED ORDER — PEGFILGRASTIM-CBQV 6 MG/0.6ML ~~LOC~~ SOSY
6.0000 mg | PREFILLED_SYRINGE | Freq: Once | SUBCUTANEOUS | Status: AC
  Administered 2018-02-14: 6 mg via SUBCUTANEOUS

## 2018-02-14 NOTE — Patient Instructions (Signed)
Pegfilgrastim injection What is this medicine? PEGFILGRASTIM (PEG fil gra stim) is a long-acting granulocyte colony-stimulating factor that stimulates the growth of neutrophils, a type of white blood cell important in the body's fight against infection. It is used to reduce the incidence of fever and infection in patients with certain types of cancer who are receiving chemotherapy that affects the bone marrow, and to increase survival after being exposed to high doses of radiation. This medicine may be used for other purposes; ask your health care provider or pharmacist if you have questions. COMMON BRAND NAME(S): Neulasta What should I tell my health care provider before I take this medicine? They need to know if you have any of these conditions: -kidney disease -latex allergy -ongoing radiation therapy -sickle cell disease -skin reactions to acrylic adhesives (On-Body Injector only) -an unusual or allergic reaction to pegfilgrastim, filgrastim, other medicines, foods, dyes, or preservatives -pregnant or trying to get pregnant -breast-feeding How should I use this medicine? This medicine is for injection under the skin. If you get this medicine at home, you will be taught how to prepare and give the pre-filled syringe or how to use the On-body Injector. Refer to the patient Instructions for Use for detailed instructions. Use exactly as directed. Tell your healthcare provider immediately if you suspect that the On-body Injector may not have performed as intended or if you suspect the use of the On-body Injector resulted in a missed or partial dose. It is important that you put your used needles and syringes in a special sharps container. Do not put them in a trash can. If you do not have a sharps container, call your pharmacist or healthcare provider to get one. Talk to your pediatrician regarding the use of this medicine in children. While this drug may be prescribed for selected conditions,  precautions do apply. Overdosage: If you think you have taken too much of this medicine contact a poison control center or emergency room at once. NOTE: This medicine is only for you. Do not share this medicine with others. What if I miss a dose? It is important not to miss your dose. Call your doctor or health care professional if you miss your dose. If you miss a dose due to an On-body Injector failure or leakage, a new dose should be administered as soon as possible using a single prefilled syringe for manual use. What may interact with this medicine? Interactions have not been studied. Give your health care provider a list of all the medicines, herbs, non-prescription drugs, or dietary supplements you use. Also tell them if you smoke, drink alcohol, or use illegal drugs. Some items may interact with your medicine. This list may not describe all possible interactions. Give your health care provider a list of all the medicines, herbs, non-prescription drugs, or dietary supplements you use. Also tell them if you smoke, drink alcohol, or use illegal drugs. Some items may interact with your medicine. What should I watch for while using this medicine? You may need blood work done while you are taking this medicine. If you are going to need a MRI, CT scan, or other procedure, tell your doctor that you are using this medicine (On-Body Injector only). What side effects may I notice from receiving this medicine? Side effects that you should report to your doctor or health care professional as soon as possible: -allergic reactions like skin rash, itching or hives, swelling of the face, lips, or tongue -dizziness -fever -pain, redness, or irritation at site   where injected -pinpoint red spots on the skin -red or dark-brown urine -shortness of breath or breathing problems -stomach or side pain, or pain at the shoulder -swelling -tiredness -trouble passing urine or change in the amount of urine Side  effects that usually do not require medical attention (report to your doctor or health care professional if they continue or are bothersome): -bone pain -muscle pain This list may not describe all possible side effects. Call your doctor for medical advice about side effects. You may report side effects to FDA at 1-800-FDA-1088. Where should I keep my medicine? Keep out of the reach of children. Store pre-filled syringes in a refrigerator between 2 and 8 degrees C (36 and 46 degrees F). Do not freeze. Keep in carton to protect from light. Throw away this medicine if it is left out of the refrigerator for more than 48 hours. Throw away any unused medicine after the expiration date. NOTE: This sheet is a summary. It may not cover all possible information. If you have questions about this medicine, talk to your doctor, pharmacist, or health care provider.  2018 Elsevier/Gold Standard (2016-10-13 12:58:03)  

## 2018-02-26 ENCOUNTER — Telehealth: Payer: Self-pay | Admitting: Hematology and Oncology

## 2018-02-26 ENCOUNTER — Inpatient Hospital Stay: Payer: BLUE CROSS/BLUE SHIELD

## 2018-02-26 ENCOUNTER — Inpatient Hospital Stay (HOSPITAL_BASED_OUTPATIENT_CLINIC_OR_DEPARTMENT_OTHER): Payer: BLUE CROSS/BLUE SHIELD | Admitting: Adult Health

## 2018-02-26 ENCOUNTER — Encounter: Payer: Self-pay | Admitting: Adult Health

## 2018-02-26 VITALS — HR 72 | Temp 98.0°F | Resp 18 | Ht 60.25 in | Wt 122.6 lb

## 2018-02-26 VITALS — BP 108/79

## 2018-02-26 DIAGNOSIS — Z170 Estrogen receptor positive status [ER+]: Secondary | ICD-10-CM | POA: Diagnosis not present

## 2018-02-26 DIAGNOSIS — C50211 Malignant neoplasm of upper-inner quadrant of right female breast: Secondary | ICD-10-CM

## 2018-02-26 DIAGNOSIS — Z95828 Presence of other vascular implants and grafts: Secondary | ICD-10-CM

## 2018-02-26 DIAGNOSIS — F419 Anxiety disorder, unspecified: Secondary | ICD-10-CM

## 2018-02-26 DIAGNOSIS — D509 Iron deficiency anemia, unspecified: Secondary | ICD-10-CM

## 2018-02-26 LAB — CBC WITH DIFFERENTIAL (CANCER CENTER ONLY)
Basophils Absolute: 0 10*3/uL (ref 0.0–0.1)
Basophils Relative: 0 %
EOS ABS: 0 10*3/uL (ref 0.0–0.5)
EOS PCT: 0 %
HCT: 33.2 % — ABNORMAL LOW (ref 34.8–46.6)
Hemoglobin: 10.9 g/dL — ABNORMAL LOW (ref 11.6–15.9)
LYMPHS ABS: 0.8 10*3/uL — AB (ref 0.9–3.3)
Lymphocytes Relative: 12 %
MCH: 22 pg — AB (ref 25.1–34.0)
MCHC: 32.8 g/dL (ref 31.5–36.0)
MCV: 67.1 fL — AB (ref 79.5–101.0)
MONOS PCT: 9 %
Monocytes Absolute: 0.7 10*3/uL (ref 0.1–0.9)
Neutro Abs: 5.5 10*3/uL (ref 1.5–6.5)
Neutrophils Relative %: 79 %
PLATELETS: 189 10*3/uL (ref 145–400)
RBC: 4.95 MIL/uL (ref 3.70–5.45)
RDW: 17.3 % — AB (ref 11.2–14.5)
WBC: 7 10*3/uL (ref 3.9–10.3)
nRBC: 1 /100 WBC — ABNORMAL HIGH

## 2018-02-26 LAB — CMP (CANCER CENTER ONLY)
ALT: 19 U/L (ref 0–55)
AST: 15 U/L (ref 5–34)
Albumin: 4 g/dL (ref 3.5–5.0)
Alkaline Phosphatase: 94 U/L (ref 40–150)
Anion gap: 8 (ref 3–11)
BUN: 8 mg/dL (ref 7–26)
CHLORIDE: 105 mmol/L (ref 98–109)
CO2: 27 mmol/L (ref 22–29)
CREATININE: 0.72 mg/dL (ref 0.60–1.10)
Calcium: 9.6 mg/dL (ref 8.4–10.4)
GFR, Est AFR Am: >60 mL/min (ref >60)
Glucose, Bld: 100 mg/dL (ref 70–140)
Potassium: 3.7 mmol/L (ref 3.5–5.1)
Sodium: 140 mmol/L (ref 136–145)
Total Bilirubin: <0.2 mg/dL — ABNORMAL LOW (ref 0.2–1.2)
Total Protein: 7.2 g/dL (ref 6.4–8.3)

## 2018-02-26 MED ORDER — SODIUM CHLORIDE 0.9 % IV SOLN
600.0000 mg/m2 | Freq: Once | INTRAVENOUS | Status: AC
  Administered 2018-02-26: 940 mg via INTRAVENOUS
  Filled 2018-02-26: qty 47

## 2018-02-26 MED ORDER — SODIUM CHLORIDE 0.9% FLUSH
10.0000 mL | INTRAVENOUS | Status: DC | PRN
  Administered 2018-02-26: 10 mL via INTRAVENOUS
  Filled 2018-02-26: qty 10

## 2018-02-26 MED ORDER — PALONOSETRON HCL INJECTION 0.25 MG/5ML
INTRAVENOUS | Status: AC
  Filled 2018-02-26: qty 5

## 2018-02-26 MED ORDER — GOSERELIN ACETATE 3.6 MG ~~LOC~~ IMPL
DRUG_IMPLANT | SUBCUTANEOUS | Status: AC
  Filled 2018-02-26: qty 3.6

## 2018-02-26 MED ORDER — HEPARIN SOD (PORK) LOCK FLUSH 100 UNIT/ML IV SOLN
500.0000 [IU] | Freq: Once | INTRAVENOUS | Status: AC | PRN
  Administered 2018-02-26: 500 [IU]
  Filled 2018-02-26: qty 5

## 2018-02-26 MED ORDER — SODIUM CHLORIDE 0.9% FLUSH
10.0000 mL | INTRAVENOUS | Status: DC | PRN
  Administered 2018-02-26: 10 mL
  Filled 2018-02-26: qty 10

## 2018-02-26 MED ORDER — GOSERELIN ACETATE 3.6 MG ~~LOC~~ IMPL
3.6000 mg | DRUG_IMPLANT | Freq: Once | SUBCUTANEOUS | Status: AC
  Administered 2018-02-26: 3.6 mg via SUBCUTANEOUS

## 2018-02-26 MED ORDER — DOXORUBICIN HCL CHEMO IV INJECTION 2 MG/ML
60.0000 mg/m2 | Freq: Once | INTRAVENOUS | Status: AC
  Administered 2018-02-26: 94 mg via INTRAVENOUS
  Filled 2018-02-26: qty 47

## 2018-02-26 MED ORDER — SODIUM CHLORIDE 0.9 % IV SOLN
Freq: Once | INTRAVENOUS | Status: AC
  Administered 2018-02-26: 14:00:00 via INTRAVENOUS
  Filled 2018-02-26: qty 5

## 2018-02-26 MED ORDER — SODIUM CHLORIDE 0.9 % IV SOLN
Freq: Once | INTRAVENOUS | Status: AC
  Administered 2018-02-26: 13:00:00 via INTRAVENOUS

## 2018-02-26 MED ORDER — PALONOSETRON HCL INJECTION 0.25 MG/5ML
0.2500 mg | Freq: Once | INTRAVENOUS | Status: AC
  Administered 2018-02-26: 0.25 mg via INTRAVENOUS

## 2018-02-26 NOTE — Telephone Encounter (Signed)
No los completed for 4/29 visit.

## 2018-02-26 NOTE — Progress Notes (Signed)
At completion of Cytoxan infusion patient complained of headache.  Patient stated, "it happened the last time I got medicine at the end too."  NS flushed administered, patient voiced headache subsided.

## 2018-02-26 NOTE — Patient Instructions (Addendum)
[facility] Discharge Instructions for Patients Receiving Chemotherapy  Today you received the following chemotherapy agents ADRIAMYCIN,CYTOXAN To help prevent nausea and vomiting after your treatment, we encourage you to take your nausea medication If you develop nausea and vomiting that is not controlled by your nausea medication, call the clinic.   BELOW ARE SYMPTOMS THAT SHOULD BE REPORTED IMMEDIATELY:  *FEVER GREATER THAN 100.5 F  *CHILLS WITH OR WITHOUT FEVER  NAUSEA AND VOMITING THAT IS NOT CONTROLLED WITH YOUR NAUSEA MEDICATION  *UNUSUAL SHORTNESS OF BREATH  *UNUSUAL BRUISING OR BLEEDING  TENDERNESS IN MOUTH AND THROAT WITH OR WITHOUT PRESENCE OF ULCERS  *URINARY PROBLEMS  *BOWEL PROBLEMS  UNUSUAL RASH Items with * indicate a potential emergency and should be followed up as soon as possible.  Feel free to call the clinic should you have any questions or concerns. The clinic phone number is [phone].  Please show the [place] at check-in to the Emergency Department and triage nurse.  Goserelin injection What is this medicine? GOSERELIN (GOE se rel in) is similar to a hormone found in the body. It lowers the amount of sex hormones that the body makes. Men will have lower testosterone levels and women will have lower estrogen levels while taking this medicine. In men, this medicine is used to treat prostate cancer; the injection is either given once per month or once every 12 weeks. A once per month injection (only) is used to treat women with endometriosis, dysfunctional uterine bleeding, or advanced breast cancer. This medicine may be used for other purposes; ask your health care provider or pharmacist if you have questions. COMMON BRAND NAME(S): Zoladex What should I tell my health care provider before I take this medicine? They need to know if you have any of these conditions (some only apply to women): -diabetes -heart disease or previous  heart attack -high blood pressure -high cholesterol -kidney disease -osteoporosis or low bone density -problems passing urine -spinal cord injury -stroke -tobacco smoker -an unusual or allergic reaction to goserelin, hormone therapy, other medicines, foods, dyes, or preservatives -pregnant or trying to get pregnant -breast-feeding How should I use this medicine? This medicine is for injection under the skin. It is given by a health care professional in a hospital or clinic setting. Men receive this injection once every 4 weeks or once every 12 weeks. Women will only receive the once every 4 weeks injection. Talk to your pediatrician regarding the use of this medicine in children. Special care may be needed. Overdosage: If you think you have taken too much of this medicine contact a poison control center or emergency room at once. NOTE: This medicine is only for you. Do not share this medicine with others. What if I miss a dose? It is important not to miss your dose. Call your doctor or health care professional if you are unable to keep an appointment. What may interact with this medicine? -female hormones like estrogen -herbal or dietary supplements like black cohosh, chasteberry, or DHEA -female hormones like testosterone -prasterone This list may not describe all possible interactions. Give your health care provider a list of all the medicines, herbs, non-prescription drugs, or dietary supplements you use. Also tell them if you smoke, drink alcohol, or use illegal drugs. Some items may interact with your medicine. What should I watch for while using this medicine? Visit your doctor or health care professional for regular checks on your progress. Your symptoms may appear to get worse during  the first weeks of this therapy. Tell your doctor or healthcare professional if your symptoms do not start to get better or if they get worse after this time. Your bones may get weaker if you take this  medicine for a long time. If you smoke or frequently drink alcohol you may increase your risk of bone loss. A family history of osteoporosis, chronic use of drugs for seizures (convulsions), or corticosteroids can also increase your risk of bone loss. Talk to your doctor about how to keep your bones strong. This medicine should stop regular monthly menstration in women. Tell your doctor if you continue to [facility]. Women should not become pregnant while taking this medicine or for 12 weeks after stopping this medicine. Women should inform their doctor if they wish to become pregnant or think they might be pregnant. There is a potential for serious side effects to an unborn child. Talk to your health care professional or pharmacist for more information. Do not breast-feed an infant while taking this medicine. Men should inform their doctors if they wish to father a child. This medicine may lower sperm counts. Talk to your health care professional or pharmacist for more information. What side effects may I notice from receiving this medicine? Side effects that you should report to your doctor or health care professional as soon as possible: -allergic reactions like skin rash, itching or hives, swelling of the face, lips, or tongue -bone pain -breathing problems -changes in vision -chest pain -feeling faint or lightheaded, falls -fever, chills -pain, swelling, warmth in the leg -pain, tingling, numbness in the hands or feet -signs and symptoms of low blood pressure like dizziness; feeling faint or lightheaded, falls; unusually weak or tired -stomach pain -swelling of the ankles, feet, hands -trouble passing urine or change in the amount of urine -unusually high or low blood pressure -unusually weak or tired Side effects that usually do not require medical attention (report to your doctor or health care professional if they continue or are bothersome): -change in sex drive or performance -changes  in breast size in both males and females -changes in emotions or moods -headache -hot flashes -irritation at site where injected -loss of appetite -skin problems like acne, dry skin -vaginal dryness This list may not describe all possible side effects. Call your doctor for medical advice about side effects. You may report side effects to FDA at 1-800-FDA-1088. Where should I keep my medicine? This drug is given in a hospital or clinic and will not be stored at home. NOTE: This sheet is a summary. It may not cover all possible information. If you have questions about this medicine, talk to your doctor, pharmacist, or health care provider.  2018 Elsevier/Gold Standard (2013-12-24 11:10:35)

## 2018-02-26 NOTE — Progress Notes (Signed)
[facility] Follow up:    Care, [facility] [address] [place]  [address]   DIAGNOSIS: Cancer Staging Malignant neoplasm of upper-inner quadrant of right breast in female, estrogen receptor positive ([place]) Staging form: Breast, AJCC 8th Edition - Clinical stage from 01/17/2018: Stage IIB (cT2, cN1, cM0, G3, ER: Positive, PR: Positive, HER2: Negative) - Unsigned   SUMMARY OF ONCOLOGIC HISTORY:   Malignant neoplasm of upper-inner quadrant of right breast in female, estrogen receptor positive ([place])   01/10/2018 Initial Diagnosis    Palpable right breast mass with calcifications medial right breast at 1 o'clock position: 2.6 cm by ultrasound, 1 abnormal lymph node: Ultrasound biopsy of both the mass and the lymph node revealed grade 3 invasive ductal carcinoma ER 90%, PR 2%, Ki-67 70%, HER-2 negative ratio 0.96, T2 N1 stage II a AJCC 8 clinical stage      01/29/2018 -  Neo-Adjuvant Chemotherapy    Dose dense Adriamycin and Cytoxan followed by Taxol x12       CURRENT THERAPY: AC cycle 3  INTERVAL HISTORY: Kim Griffin 22 y.o. female returns for evaluaiton prior to receiving her third cycle of neoadjuvant chemotherapy with Adriamycin and Cytoxan.  She is tolerating this well with mild fatigue noted.  She denies any other issues today.     Patient Active Problem List   Diagnosis Date Noted  . Genetic testing 02/08/2018  . Malignant neoplasm of upper-inner quadrant of right breast in female, estrogen receptor positive ([place]) 01/16/2018    has No Known Allergies.  MEDICAL HISTORY: Past Medical History:  Diagnosis Date  . Anxiety    Panic attack- prior to diagnosis- "pain and felt winded"  . Breast cancer ([place])   . Breast cyst, right   . Genetic testing 02/08/2018   Multi-Cancer panel (83 genes) @ [facility] - No pathogenic mutations detected  . Hx of seasonal allergies     SURGICAL HISTORY: Past Surgical History:   Procedure Laterality Date  . PORTACATH PLACEMENT N/A 01/22/2018   Procedure: INSERTION PORT-A-CATH WITH ULTRASOUND ERAS PATHWAY;  Surgeon: Alphonsa Overall, MD;  Location: [facility];  Service: General;  Laterality: N/A;  . WISDOM TOOTH EXTRACTION      SOCIAL HISTORY: Social History   Socioeconomic History  . Marital status: Single    Spouse name: Not on file  . Number of children: Not on file  . Years of education: Not on file  . Highest education level: Not on file  Occupational History  . Not on file  Social Needs  . Financial resource strain: Not on file  . Food insecurity:    Worry: Not on file    Inability: Not on file  . Transportation needs:    Medical: Not on file    Non-medical: Not on file  Tobacco Use  . Smoking status: Never Smoker  . Smokeless tobacco: Never Used  Substance and Sexual Activity  . Alcohol use: No    Frequency: Never  . Drug use: No  . Sexual activity: Not on file  Lifestyle  . Physical activity:    Days per week: Not on file    Minutes per session: Not on file  . Stress: Not on file  Relationships  . Social connections:    Talks on phone: Not on file    Gets together: Not on file    Attends religious service: Not on file    Active member of club or organization: Not on  file    Attends meetings of clubs or organizations: Not on file    Relationship status: Not on file  . Intimate partner violence:    Fear of current or ex partner: Not on file    Emotionally abused: Not on file    Physically abused: Not on file    Forced sexual activity: Not on file  Other Topics Concern  . Not on file  Social History Narrative  . Not on file    FAMILY HISTORY: Family History  Problem Relation Age of Onset  . Breast cancer Other 75       maternal grandmother's sister's daughter; currently 83  . Lung cancer Other        2 of maternal grandmother's siblings    Review of Systems  Constitutional: Positive for fatigue. Negative for appetite change,  chills, fever and unexpected weight change.  HENT:   Negative for hearing loss, lump/mass and trouble swallowing.   Eyes: Negative for eye problems and icterus.  Respiratory: Negative for chest tightness, cough and shortness of breath.   Cardiovascular: Negative for chest pain, leg swelling and palpitations.  Gastrointestinal: Negative for abdominal distention, abdominal pain, constipation, diarrhea, nausea and vomiting.  Endocrine: Negative for hot flashes.  Skin: Negative for itching and rash.  Neurological: Negative for dizziness, extremity weakness, headaches and numbness.  Hematological: Negative for adenopathy. Does not bruise/bleed easily.  Psychiatric/Behavioral: Negative for depression. The patient is not nervous/anxious.       PHYSICAL EXAMINATION  ECOG PERFORMANCE STATUS: 1 - Symptomatic but completely ambulatory  Vitals:   02/26/18 1155  Pulse: 72  Resp: 18  Temp: 98 F (36.7 C)  SpO2: 100%    Physical Exam  Constitutional: She is oriented to person, place, and time. She appears well-developed and well-nourished.  HENT:  Head: Normocephalic and atraumatic.  Eyes: Pupils are equal, round, and reactive to light. No scleral icterus.  Neck: Neck supple.  Cardiovascular: Normal rate, regular rhythm and normal heart sounds.  Pulmonary/Chest: Effort normal and breath sounds normal. No stridor. No respiratory distress. She has no wheezes.  Abdominal: Soft. Bowel sounds are normal. She exhibits no distension and no mass. There is no tenderness. There is no rebound and no guarding.  Musculoskeletal: She exhibits no edema.  Lymphadenopathy:    She has no cervical adenopathy.  Neurological: She is alert and oriented to person, place, and time.  Skin: Skin is warm and dry. No rash noted.  Psychiatric: She has a normal mood and affect.    LABORATORY DATA:  CBC    Component Value Date/Time   WBC 7.0 02/26/2018 1119   WBC 9.1 01/13/2011 2242   RBC 4.95 02/26/2018 1119    HGB 10.9 (L) 02/26/2018 1119   HCT 33.2 (L) 02/26/2018 1119   PLT 189 02/26/2018 1119   MCV 67.1 (L) 02/26/2018 1119   MCH 22.0 (L) 02/26/2018 1119   MCHC 32.8 02/26/2018 1119   RDW 17.3 (H) 02/26/2018 1119   LYMPHSABS 0.8 (L) 02/26/2018 1119   MONOABS 0.7 02/26/2018 1119   EOSABS 0.0 02/26/2018 1119   BASOSABS 0.0 02/26/2018 1119    CMP     Component Value Date/Time   NA 140 02/26/2018 1119   K 3.7 02/26/2018 1119   CL 105 02/26/2018 1119   CO2 27 02/26/2018 1119   GLUCOSE 100 02/26/2018 1119   BUN 8 02/26/2018 1119   CREATININE 0.72 02/26/2018 1119   CALCIUM 9.6 02/26/2018 1119   PROT 7.2 02/26/2018 1119  ALBUMIN 4.0 02/26/2018 1119   AST 15 02/26/2018 1119   ALT 19 02/26/2018 1119   ALKPHOS 94 02/26/2018 1119   BILITOT <0.2 (L) 02/26/2018 1119   GFRNONAA >60 02/26/2018 1119   GFRAA >60 02/26/2018 1119       ASSESSMENT and THERAPY PLAN:   Malignant neoplasm of upper-inner quadrant of right breast in female, estrogen receptor positive ([place]) 01/10/2018:Palpable right breast mass with calcifications medial right breast at 1 o'clock position: 2.6 cm by ultrasound, 1 abnormal lymph node: Ultrasound biopsy of both the mass and the lymph node revealed grade 3 invasive ductal carcinoma ER 90%, PR 2%, Ki-67 70%, HER-2 negative ratio 0.96, T2 N1 stage II a AJCC 8 clinical stage  Recommendation: 1 Genetic testing.Staging scans 2.neoadjuvant chemotherapy with dose dense Adriamycin and Cytoxan followed by Taxol weekly x12 3.Followed by surgery depending on genetic test results and patient's preference 4.Followed by adjuvant radiation 5.Followed by antiestrogen therapy and evaluation on Natalee clinical trial ------------------------------------------------------------------------ Current treatment: Cycle 3 day 1 dose dense Adriamycin Cytoxan Echocardiogram3/28/2019: Bellicus.Kim to 70%  Chemo toxicities: Denies any nausea or vomiting. Mild fatigue. In fact patient  has tolerated chemotherapy extremely well.  Microcytic anemia: iron deficiency and hemoglobin electropheresis  Kim Griffin is doing well today.  She will proceed with chemotherapy today.  She will return in 2 weeks for labs, f/u with Dr. Lindi Adie, and her final Adriamycin/Cytoxan treatment.      All questions were answered. The patient knows to call the clinic with any problems, questions or concerns. We can certainly see the patient much sooner if necessary.  A total of (20) minutes of face-to-face time was spent with this patient with greater than 50% of that time in counseling and care-coordination.  This note was electronically signed. Scot Dock, NP 02/26/2018

## 2018-02-26 NOTE — Assessment & Plan Note (Addendum)
01/10/2018:Palpable right breast mass with calcifications medial right breast at 1 o'clock position: 2.6 cm by ultrasound, 1 abnormal lymph node: Ultrasound biopsy of both the mass and the lymph node revealed grade 3 invasive ductal carcinoma ER 90%, PR 2%, Ki-67 70%, HER-2 negative ratio 0.96, T2 N1 stage II a AJCC 8 clinical stage  Recommendation: 1 Genetic testing.Staging scans 2.neoadjuvant chemotherapy with dose dense Adriamycin and Cytoxan followed by Taxol weekly x12 3.Followed by surgery depending on genetic test results and patient's preference 4.Followed by adjuvant radiation 5.Followed by antiestrogen therapy and evaluation on Natalee clinical trial ------------------------------------------------------------------------ Current treatment: Cycle 3 day 1 dose dense Adriamycin Cytoxan Echocardiogram3/28/2019: Bellicus.Nancy to 70%  Chemo toxicities: Denies any nausea or vomiting. Mild fatigue. In fact patient has tolerated chemotherapy extremely well.  Microcytic anemia: iron deficiency and hemoglobin electropheresis  Kim Griffin is doing well today.  She will proceed with chemotherapy today.  She will return in 2 weeks for labs, f/u with Dr. Lindi Adie, and her final Adriamycin/Cytoxan treatment.

## 2018-02-28 ENCOUNTER — Inpatient Hospital Stay: Payer: BLUE CROSS/BLUE SHIELD | Attending: Hematology and Oncology

## 2018-02-28 VITALS — BP 119/57 | HR 74 | Temp 98.4°F | Resp 18

## 2018-02-28 DIAGNOSIS — C50211 Malignant neoplasm of upper-inner quadrant of right female breast: Secondary | ICD-10-CM | POA: Insufficient documentation

## 2018-02-28 DIAGNOSIS — D509 Iron deficiency anemia, unspecified: Secondary | ICD-10-CM | POA: Insufficient documentation

## 2018-02-28 DIAGNOSIS — Z5111 Encounter for antineoplastic chemotherapy: Secondary | ICD-10-CM | POA: Insufficient documentation

## 2018-02-28 DIAGNOSIS — Z170 Estrogen receptor positive status [ER+]: Secondary | ICD-10-CM | POA: Insufficient documentation

## 2018-02-28 DIAGNOSIS — Z5189 Encounter for other specified aftercare: Secondary | ICD-10-CM | POA: Insufficient documentation

## 2018-02-28 DIAGNOSIS — R5383 Other fatigue: Secondary | ICD-10-CM | POA: Diagnosis not present

## 2018-02-28 DIAGNOSIS — D573 Sickle-cell trait: Secondary | ICD-10-CM | POA: Diagnosis not present

## 2018-02-28 MED ORDER — PEGFILGRASTIM-CBQV 6 MG/0.6ML ~~LOC~~ SOSY
6.0000 mg | PREFILLED_SYRINGE | Freq: Once | SUBCUTANEOUS | Status: AC
  Administered 2018-02-28: 6 mg via SUBCUTANEOUS

## 2018-02-28 MED ORDER — PEGFILGRASTIM-CBQV 6 MG/0.6ML ~~LOC~~ SOSY
PREFILLED_SYRINGE | SUBCUTANEOUS | Status: AC
  Filled 2018-02-28: qty 0.6

## 2018-03-12 ENCOUNTER — Inpatient Hospital Stay (HOSPITAL_BASED_OUTPATIENT_CLINIC_OR_DEPARTMENT_OTHER): Payer: BLUE CROSS/BLUE SHIELD | Admitting: Hematology and Oncology

## 2018-03-12 ENCOUNTER — Inpatient Hospital Stay: Payer: BLUE CROSS/BLUE SHIELD

## 2018-03-12 DIAGNOSIS — Z95828 Presence of other vascular implants and grafts: Secondary | ICD-10-CM

## 2018-03-12 DIAGNOSIS — C50211 Malignant neoplasm of upper-inner quadrant of right female breast: Secondary | ICD-10-CM

## 2018-03-12 DIAGNOSIS — Z170 Estrogen receptor positive status [ER+]: Secondary | ICD-10-CM

## 2018-03-12 DIAGNOSIS — D509 Iron deficiency anemia, unspecified: Secondary | ICD-10-CM

## 2018-03-12 LAB — CBC WITH DIFFERENTIAL (CANCER CENTER ONLY)
BASOS ABS: 0.1 10*3/uL (ref 0.0–0.1)
Basophils Relative: 1 %
Eosinophils Absolute: 0 10*3/uL (ref 0.0–0.5)
Eosinophils Relative: 0 %
HEMATOCRIT: 30.8 % — AB (ref 34.8–46.6)
Hemoglobin: 10.1 g/dL — ABNORMAL LOW (ref 11.6–15.9)
LYMPHS PCT: 7 %
Lymphs Abs: 0.5 10*3/uL — ABNORMAL LOW (ref 0.9–3.3)
MCH: 22 pg — ABNORMAL LOW (ref 25.1–34.0)
MCHC: 32.9 g/dL (ref 31.5–36.0)
MCV: 66.8 fL — AB (ref 79.5–101.0)
MONO ABS: 0.7 10*3/uL (ref 0.1–0.9)
MONOS PCT: 9 %
NEUTROS ABS: 6.4 10*3/uL (ref 1.5–6.5)
NEUTROS PCT: 83 %
Platelet Count: 201 10*3/uL (ref 145–400)
RBC: 4.61 MIL/uL (ref 3.70–5.45)
RDW: 18.2 % — AB (ref 11.2–14.5)
WBC Count: 7.7 10*3/uL (ref 3.9–10.3)

## 2018-03-12 LAB — CMP (CANCER CENTER ONLY)
ALBUMIN: 4 g/dL (ref 3.5–5.0)
ALT: 25 U/L (ref 0–55)
ANION GAP: 9 (ref 3–11)
AST: 17 U/L (ref 5–34)
Alkaline Phosphatase: 93 U/L (ref 40–150)
BUN: 11 mg/dL (ref 7–26)
CALCIUM: 9.6 mg/dL (ref 8.4–10.4)
CO2: 28 mmol/L (ref 22–29)
Chloride: 104 mmol/L (ref 98–109)
Creatinine: 0.72 mg/dL (ref 0.60–1.10)
GFR, Estimated: >60 mL/min (ref >60)
Glucose, Bld: 97 mg/dL (ref 70–140)
POTASSIUM: 3.7 mmol/L (ref 3.5–5.1)
Sodium: 141 mmol/L (ref 136–145)
TOTAL PROTEIN: 7.2 g/dL (ref 6.4–8.3)

## 2018-03-12 MED ORDER — PALONOSETRON HCL INJECTION 0.25 MG/5ML
INTRAVENOUS | Status: AC
  Filled 2018-03-12: qty 5

## 2018-03-12 MED ORDER — SODIUM CHLORIDE 0.9 % IV SOLN
600.0000 mg/m2 | Freq: Once | INTRAVENOUS | Status: AC
  Administered 2018-03-12: 940 mg via INTRAVENOUS
  Filled 2018-03-12: qty 47

## 2018-03-12 MED ORDER — SODIUM CHLORIDE 0.9% FLUSH
10.0000 mL | INTRAVENOUS | Status: DC | PRN
  Administered 2018-03-12: 10 mL
  Filled 2018-03-12: qty 10

## 2018-03-12 MED ORDER — SODIUM CHLORIDE 0.9 % IV SOLN
Freq: Once | INTRAVENOUS | Status: AC
  Administered 2018-03-12: 12:00:00 via INTRAVENOUS

## 2018-03-12 MED ORDER — SODIUM CHLORIDE 0.9% FLUSH
10.0000 mL | INTRAVENOUS | Status: DC | PRN
  Administered 2018-03-12: 10 mL via INTRAVENOUS
  Filled 2018-03-12: qty 10

## 2018-03-12 MED ORDER — HEPARIN SOD (PORK) LOCK FLUSH 100 UNIT/ML IV SOLN
500.0000 [IU] | Freq: Once | INTRAVENOUS | Status: AC | PRN
  Administered 2018-03-12: 500 [IU]
  Filled 2018-03-12: qty 5

## 2018-03-12 MED ORDER — PALONOSETRON HCL INJECTION 0.25 MG/5ML
0.2500 mg | Freq: Once | INTRAVENOUS | Status: AC
  Administered 2018-03-12: 0.25 mg via INTRAVENOUS

## 2018-03-12 MED ORDER — FOSAPREPITANT DIMEGLUMINE INJECTION 150 MG
Freq: Once | INTRAVENOUS | Status: AC
  Administered 2018-03-12: 13:00:00 via INTRAVENOUS
  Filled 2018-03-12: qty 5

## 2018-03-12 MED ORDER — DOXORUBICIN HCL CHEMO IV INJECTION 2 MG/ML
60.0000 mg/m2 | Freq: Once | INTRAVENOUS | Status: AC
  Administered 2018-03-12: 94 mg via INTRAVENOUS
  Filled 2018-03-12: qty 47

## 2018-03-12 NOTE — Assessment & Plan Note (Signed)
01/10/2018:Palpable right breast mass with calcifications medial right breast at 1 o'clock position: 2.6 cm by ultrasound, 1 abnormal lymph node: Ultrasound biopsy of both the mass and the lymph node revealed grade 3 invasive ductal carcinoma ER 90%, PR 2%, Ki-67 70%, HER-2 negative ratio 0.96, T2 N1 stage II a AJCC 8 clinical stage  Recommendation: 1 Genetic testing.Staging scans 2.neoadjuvant chemotherapy with dose dense Adriamycin and Cytoxan followed by Taxol weekly x12 3.Followed by surgery depending on genetic test results and patient's preference 4.Followed by adjuvant radiation 5.Followed by antiestrogen therapy and evaluation on Natalee clinical trial ------------------------------------------------------------------------ Current treatment: Cycle 4 day1dose dense Adriamycin Cytoxan Echocardiogram3/28/2019: EF65 to 70%  Chemo toxicities: Denies any nausea or vomiting. Mild fatigue. In fact patient has tolerated chemotherapy extremely well.  Microcytic anemia:  Iron studies show 13% saturation with a ferritin of 52 TIBC 308. Hemoglobin electrophoresis showed sickle cell trait heterozygous with hemoglobin S of 30.6%  Return to clinic in 2 weeks for cycle 1 of Taxol

## 2018-03-12 NOTE — Progress Notes (Signed)
Patient Care Team: [facility] as PCP - General Nicholas Lose, MD as Consulting Physician (Hematology and Oncology) Alphonsa Overall, MD as Consulting Physician (General Surgery) Gery Pray, MD as Consulting Physician (Radiation Oncology)  DIAGNOSIS:  Encounter Diagnosis  Name Primary?  . Malignant neoplasm of upper-inner quadrant of right breast in female, estrogen receptor positive ([place])     SUMMARY OF ONCOLOGIC HISTORY:   Malignant neoplasm of upper-inner quadrant of right breast in female, estrogen receptor positive ([place])   01/10/2018 Initial Diagnosis    Palpable right breast mass with calcifications medial right breast at 1 o'clock position: 2.6 cm by ultrasound, 1 abnormal lymph node: Ultrasound biopsy of both the mass and the lymph node revealed grade 3 invasive ductal carcinoma ER 90%, PR 2%, Ki-67 70%, HER-2 negative ratio 0.96, T2 N1 stage II a AJCC 8 clinical stage      01/29/2018 -  Neo-Adjuvant Chemotherapy    Dose dense Adriamycin and Cytoxan followed by Taxol x12       CHIEF COMPLIANT: Cycle 4 dose dense Adriamycin and Cytoxan  INTERVAL HISTORY: Kim Griffin is a 22 year old with above-mentioned history of right breast cancer is currently neoadjuvant chemotherapy with dose dense Adriamycin and Cytoxan.  Today is cycle 4 of AC.  She appears to be tolerating it fairly well.  She does have mild fatigue and nausea and hair loss.  Other than that she is tolerating it well.  REVIEW OF SYSTEMS:   Constitutional: Denies fevers, chills or abnormal weight loss Eyes: Denies blurriness of vision Ears, nose, mouth, throat, and face: Denies mucositis or sore throat Respiratory: Denies cough, dyspnea or wheezes Cardiovascular: Denies palpitation, chest discomfort Gastrointestinal:  Denies nausea, heartburn or change in bowel habits Skin: Denies abnormal skin rashes Lymphatics: Denies new lymphadenopathy or easy bruising Neurological:Denies numbness,  tingling or new weaknesses Behavioral/Psych: Mood is stable, no new changes  Extremities: No lower extremity edema Breast:  denies any pain or lumps or nodules in either breasts All other systems were reviewed with the patient and are negative.  I have reviewed the past medical history, past surgical history, social history and family history with the patient and they are unchanged from previous note.  ALLERGIES:  has No Known Allergies.  MEDICATIONS:  Current Outpatient Medications  Medication Sig Dispense Refill  . dexamethasone (DECADRON) 4 MG tablet Take 1 tab day after chemo and 1 tab 2 days after chemo with food 10 tablet 0  . ferrous sulfate 325 (65 FE) MG tablet Take 325 mg by mouth daily with breakfast.    . HYDROcodone-acetaminophen (NORCO/VICODIN) 5-325 MG tablet Take 1 tablet by mouth every 6 (six) hours as needed for moderate pain. 20 tablet 0  . ibuprofen (ADVIL,MOTRIN) 800 MG tablet Take 800 mg by mouth every 8 (eight) hours as needed for mild pain or moderate pain.    Marland Kitchen lidocaine-prilocaine (EMLA) cream Apply to affected area once 30 g 3  . LORazepam (ATIVAN) 0.5 MG tablet Take 1 tablet (0.5 mg total) by mouth at bedtime as needed for sleep. 30 tablet 0  . ondansetron (ZOFRAN) 8 MG tablet Take 1 tablet (8 mg total) by mouth 2 (two) times daily as needed. Start on the third day after chemotherapy. 30 tablet 1  . prochlorperazine (COMPAZINE) 10 MG tablet Take 1 tablet (10 mg total) by mouth every 6 (six) hours as needed (Nausea or vomiting). 30 tablet 1   No current facility-administered medications for this visit.  PHYSICAL EXAMINATION: ECOG PERFORMANCE STATUS: 1 - Symptomatic but completely ambulatory  Vitals:   03/12/18 1145  BP: 109/66  Pulse: 78  Resp: 19  Temp: 98.5 F (36.9 C)  SpO2: 100%   Filed Weights   03/12/18 1145  Weight: 120 lb 1.6 oz (54.5 kg)    GENERAL:alert, no distress and comfortable SKIN: skin color, texture, turgor are normal, no  rashes or significant lesions EYES: normal, Conjunctiva are pink and non-injected, sclera clear OROPHARYNX:no exudate, no erythema and lips, buccal mucosa, and tongue normal  NECK: supple, thyroid normal size, non-tender, without nodularity LYMPH:  no palpable lymphadenopathy in the cervical, axillary or inguinal LUNGS: clear to auscultation and percussion with normal breathing effort HEART: regular rate & rhythm and no murmurs and no lower extremity edema ABDOMEN:abdomen soft, non-tender and normal bowel sounds MUSCULOSKELETAL:no cyanosis of digits and no clubbing  NEURO: alert & oriented x 3 with fluent speech, no focal motor/sensory deficits EXTREMITIES: No lower extremity edema  LABORATORY DATA:  I have reviewed the data as listed CMP Latest Ref Rng & Units 02/26/2018 02/12/2018 02/05/2018  Glucose 70 - 140 mg/dL 100 121 122  BUN 7 - 26 mg/dL _0 Creatinine 0.60 - 1.10 mg/dL 0.72 0.81 0.80  Sodium 136 - 145 mmol/L 140 143 139  Potassium 3.5 - 5.1 mmol/L 3.7 3.7 3.7  Chloride 98 - 109 mmol/L 105 107 100  CO2 22 - 29 mmol/L _1 Calcium 8.4 - 10.4 mg/dL 9.6 9.3 9.5  Total Protein 6.4 - 8.3 g/dL 7.2 7.1 7.1  Total Bilirubin 0.2 - 1.2 mg/dL <0.2(L) <0.2(L) <0.2(L)  Alkaline Phos 40 - 150 U/L 94 99 88  AST 5 - 34 U/L _2 ALT 0 - 55 U/L _3 Lab Results  Component Value Date   WBC 7.7 03/12/2018   HGB 10.1 (L) 03/12/2018   HCT 30.8 (L) 03/12/2018   MCV 66.8 (L) 03/12/2018   PLT 201 03/12/2018   NEUTROABS 6.4 03/12/2018    ASSESSMENT & PLAN:  Malignant neoplasm of upper-inner quadrant of right breast in female, estrogen receptor positive ([facility]) 01/10/2018:Palpable right breast mass with calcifications medial right breast at 1 o'clock position: 2.6 cm by ultrasound, 1 abnormal lymph node: Ultrasound biopsy of both the mass and the lymph node revealed grade 3 invasive ductal carcinoma ER 90%, PR 2%, Ki-67 70%, HER-2 negative ratio 0.96, T2 N1 stage II a AJCC 8  clinical stage  Recommendation: 1 Genetic testing.Staging scans 2.neoadjuvant chemotherapy with dose dense Adriamycin and Cytoxan followed by Taxol weekly x12 3.Followed by surgery depending on genetic test results and patient's preference 4.Followed by adjuvant radiation 5.Followed by antiestrogen therapy and evaluation on Natalee clinical trial ------------------------------------------------------------------------ Current treatment: Cycle 4 day1dose dense Adriamycin Cytoxan Echocardiogram3/28/2019: EF65 to 70%  Chemo toxicities: Denies any nausea or vomiting. Mild fatigue. In fact patient has tolerated chemotherapy extremely well.  Microcytic anemia:  Iron studies show 13% saturation with a ferritin of 52 TIBC 308. Hemoglobin electrophoresis showed sickle cell trait heterozygous with hemoglobin S of 30.6%  Return to clinic in 2 weeks for cycle 1 of Taxol    No orders of the defined types were placed in this encounter.  The patient has a good understanding of the overall plan. she agrees with it. she will call with any problems that may develop before the next visit here.   Harriette Ohara, MD 03/12/18

## 2018-03-12 NOTE — Patient Instructions (Signed)
[facility] Discharge Instructions for Patients Receiving Chemotherapy  Today you received the following chemotherapy agents ADRIAMYCIN,CYTOXAN To help prevent nausea and vomiting after your treatment, we encourage you to take your nausea medication If you develop nausea and vomiting that is not controlled by your nausea medication, call the clinic.   BELOW ARE SYMPTOMS THAT SHOULD BE REPORTED IMMEDIATELY:  *FEVER GREATER THAN 100.5 F  *CHILLS WITH OR WITHOUT FEVER  NAUSEA AND VOMITING THAT IS NOT CONTROLLED WITH YOUR NAUSEA MEDICATION  *UNUSUAL SHORTNESS OF BREATH  *UNUSUAL BRUISING OR BLEEDING  TENDERNESS IN MOUTH AND THROAT WITH OR WITHOUT PRESENCE OF ULCERS  *URINARY PROBLEMS  *BOWEL PROBLEMS  UNUSUAL RASH Items with * indicate a potential emergency and should be followed up as soon as possible.  Feel free to call the clinic should you have any questions or concerns. The clinic phone number is [phone].  Please show the [facility] at check-in to the Emergency Department and triage nurse.  Goserelin injection What is this medicine? GOSERELIN (GOE se rel in) is similar to a hormone found in the body. It lowers the amount of sex hormones that the body makes. Men will have lower testosterone levels and women will have lower estrogen levels while taking this medicine. In men, this medicine is used to treat prostate cancer; the injection is either given once per month or once every 12 weeks. A once per month injection (only) is used to treat women with endometriosis, dysfunctional uterine bleeding, or advanced breast cancer. This medicine may be used for other purposes; ask your health care provider or pharmacist if you have questions. COMMON BRAND NAME(S): Zoladex What should I tell my health care provider before I take this medicine? They need to know if you have any of these conditions (some only apply to women): -diabetes -heart disease or previous  heart attack -high blood pressure -high cholesterol -kidney disease -osteoporosis or low bone density -problems passing urine -spinal cord injury -stroke -tobacco smoker -an unusual or allergic reaction to goserelin, hormone therapy, other medicines, foods, dyes, or preservatives -pregnant or trying to get pregnant -breast-feeding How should I use this medicine? This medicine is for injection under the skin. It is given by a health care professional in a hospital or clinic setting. Men receive this injection once every 4 weeks or once every 12 weeks. Women will only receive the once every 4 weeks injection. Talk to your pediatrician regarding the use of this medicine in children. Special care may be needed. Overdosage: If you think you have taken too much of this medicine contact a poison control center or emergency room at once. NOTE: This medicine is only for you. Do not share this medicine with others. What if I miss a dose? It is important not to miss your dose. Call your doctor or health care professional if you are unable to keep an appointment. What may interact with this medicine? -female hormones like estrogen -herbal or dietary supplements like black cohosh, chasteberry, or DHEA -female hormones like testosterone -prasterone This list may not describe all possible interactions. Give your health care provider a list of all the medicines, herbs, non-prescription drugs, or dietary supplements you use. Also tell them if you smoke, drink alcohol, or use illegal drugs. Some items may interact with your medicine. What should I watch for while using this medicine? Visit your doctor or health care professional for regular checks on your progress. Your symptoms may appear to get worse during  the first weeks of this therapy. Tell your doctor or healthcare professional if your symptoms do not start to get better or if they get worse after this time. Your bones may get weaker if you take this  medicine for a long time. If you smoke or frequently drink alcohol you may increase your risk of bone loss. A family history of osteoporosis, chronic use of drugs for seizures (convulsions), or corticosteroids can also increase your risk of bone loss. Talk to your doctor about how to keep your bones strong. This medicine should stop regular monthly menstration in women. Tell your doctor if you continue to [facility]. Women should not become pregnant while taking this medicine or for 12 weeks after stopping this medicine. Women should inform their doctor if they wish to become pregnant or think they might be pregnant. There is a potential for serious side effects to an unborn child. Talk to your health care professional or pharmacist for more information. Do not breast-feed an infant while taking this medicine. Men should inform their doctors if they wish to father a child. This medicine may lower sperm counts. Talk to your health care professional or pharmacist for more information. What side effects may I notice from receiving this medicine? Side effects that you should report to your doctor or health care professional as soon as possible: -allergic reactions like skin rash, itching or hives, swelling of the face, lips, or tongue -bone pain -breathing problems -changes in vision -chest pain -feeling faint or lightheaded, falls -fever, chills -pain, swelling, warmth in the leg -pain, tingling, numbness in the hands or feet -signs and symptoms of low blood pressure like dizziness; feeling faint or lightheaded, falls; unusually weak or tired -stomach pain -swelling of the ankles, feet, hands -trouble passing urine or change in the amount of urine -unusually high or low blood pressure -unusually weak or tired Side effects that usually do not require medical attention (report to your doctor or health care professional if they continue or are bothersome): -change in sex drive or performance -changes  in breast size in both males and females -changes in emotions or moods -headache -hot flashes -irritation at site where injected -loss of appetite -skin problems like acne, dry skin -vaginal dryness This list may not describe all possible side effects. Call your doctor for medical advice about side effects. You may report side effects to FDA at 1-800-FDA-1088. Where should I keep my medicine? This drug is given in a hospital or clinic and will not be stored at home. NOTE: This sheet is a summary. It may not cover all possible information. If you have questions about this medicine, talk to your doctor, pharmacist, or health care provider.  2018 Elsevier/Gold Standard (2013-12-24 11:10:35)

## 2018-03-14 ENCOUNTER — Inpatient Hospital Stay: Payer: BLUE CROSS/BLUE SHIELD

## 2018-03-14 VITALS — BP 106/66 | HR 79 | Temp 97.7°F | Resp 20

## 2018-03-14 DIAGNOSIS — C50211 Malignant neoplasm of upper-inner quadrant of right female breast: Secondary | ICD-10-CM

## 2018-03-14 DIAGNOSIS — Z170 Estrogen receptor positive status [ER+]: Principal | ICD-10-CM

## 2018-03-14 MED ORDER — PEGFILGRASTIM-CBQV 6 MG/0.6ML ~~LOC~~ SOSY
6.0000 mg | PREFILLED_SYRINGE | Freq: Once | SUBCUTANEOUS | Status: AC
  Administered 2018-03-14: 6 mg via SUBCUTANEOUS

## 2018-03-14 MED ORDER — PEGFILGRASTIM-CBQV 6 MG/0.6ML ~~LOC~~ SOSY
PREFILLED_SYRINGE | SUBCUTANEOUS | Status: AC
  Filled 2018-03-14: qty 0.6

## 2018-03-14 NOTE — Patient Instructions (Signed)
Pegfilgrastim injection What is this medicine? PEGFILGRASTIM (PEG fil gra stim) is a long-acting granulocyte colony-stimulating factor that stimulates the growth of neutrophils, a type of white blood cell important in the body's fight against infection. It is used to reduce the incidence of fever and infection in patients with certain types of cancer who are receiving chemotherapy that affects the bone marrow, and to increase survival after being exposed to high doses of radiation. This medicine may be used for other purposes; ask your health care provider or pharmacist if you have questions. COMMON BRAND NAME(S): Neulasta What should I tell my health care provider before I take this medicine? They need to know if you have any of these conditions: -kidney disease -latex allergy -ongoing radiation therapy -sickle cell disease -skin reactions to acrylic adhesives (On-Body Injector only) -an unusual or allergic reaction to pegfilgrastim, filgrastim, other medicines, foods, dyes, or preservatives -pregnant or trying to get pregnant -breast-feeding How should I use this medicine? This medicine is for injection under the skin. If you get this medicine at home, you will be taught how to prepare and give the pre-filled syringe or how to use the On-body Injector. Refer to the patient Instructions for Use for detailed instructions. Use exactly as directed. Tell your healthcare provider immediately if you suspect that the On-body Injector may not have performed as intended or if you suspect the use of the On-body Injector resulted in a missed or partial dose. It is important that you put your used needles and syringes in a special sharps container. Do not put them in a trash can. If you do not have a sharps container, call your pharmacist or healthcare provider to get one. Talk to your pediatrician regarding the use of this medicine in children. While this drug may be prescribed for selected conditions,  precautions do apply. Overdosage: If you think you have taken too much of this medicine contact a poison control center or emergency room at once. NOTE: This medicine is only for you. Do not share this medicine with others. What if I miss a dose? It is important not to miss your dose. Call your doctor or health care professional if you miss your dose. If you miss a dose due to an On-body Injector failure or leakage, a new dose should be administered as soon as possible using a single prefilled syringe for manual use. What may interact with this medicine? Interactions have not been studied. Give your health care provider a list of all the medicines, herbs, non-prescription drugs, or dietary supplements you use. Also tell them if you smoke, drink alcohol, or use illegal drugs. Some items may interact with your medicine. This list may not describe all possible interactions. Give your health care provider a list of all the medicines, herbs, non-prescription drugs, or dietary supplements you use. Also tell them if you smoke, drink alcohol, or use illegal drugs. Some items may interact with your medicine. What should I watch for while using this medicine? You may need blood work done while you are taking this medicine. If you are going to need a MRI, CT scan, or other procedure, tell your doctor that you are using this medicine (On-Body Injector only). What side effects may I notice from receiving this medicine? Side effects that you should report to your doctor or health care professional as soon as possible: -allergic reactions like skin rash, itching or hives, swelling of the face, lips, or tongue -dizziness -fever -pain, redness, or irritation at site   where injected -pinpoint red spots on the skin -red or dark-brown urine -shortness of breath or breathing problems -stomach or side pain, or pain at the shoulder -swelling -tiredness -trouble passing urine or change in the amount of urine Side  effects that usually do not require medical attention (report to your doctor or health care professional if they continue or are bothersome): -bone pain -muscle pain This list may not describe all possible side effects. Call your doctor for medical advice about side effects. You may report side effects to FDA at 1-800-FDA-1088. Where should I keep my medicine? Keep out of the reach of children. Store pre-filled syringes in a refrigerator between 2 and 8 degrees C (36 and 46 degrees F). Do not freeze. Keep in carton to protect from light. Throw away this medicine if it is left out of the refrigerator for more than 48 hours. Throw away any unused medicine after the expiration date. NOTE: This sheet is a summary. It may not cover all possible information. If you have questions about this medicine, talk to your doctor, pharmacist, or health care provider.  2018 Elsevier/Gold Standard (2016-10-13 12:58:03)  

## 2018-03-15 ENCOUNTER — Telehealth: Payer: Self-pay | Admitting: Hematology and Oncology

## 2018-03-15 NOTE — Telephone Encounter (Signed)
Spoke to patient regarding upcoming may through august appointments per 5/13 sch message. July appointments scheduled with provider due to unavail. Nurse/Provider aware.

## 2018-03-28 ENCOUNTER — Inpatient Hospital Stay: Payer: BLUE CROSS/BLUE SHIELD

## 2018-03-28 ENCOUNTER — Inpatient Hospital Stay (HOSPITAL_BASED_OUTPATIENT_CLINIC_OR_DEPARTMENT_OTHER): Payer: BLUE CROSS/BLUE SHIELD | Admitting: Hematology and Oncology

## 2018-03-28 ENCOUNTER — Encounter: Payer: Self-pay | Admitting: *Deleted

## 2018-03-28 VITALS — BP 116/69 | HR 93 | Temp 98.2°F | Resp 16

## 2018-03-28 DIAGNOSIS — D573 Sickle-cell trait: Secondary | ICD-10-CM

## 2018-03-28 DIAGNOSIS — Z170 Estrogen receptor positive status [ER+]: Secondary | ICD-10-CM | POA: Diagnosis not present

## 2018-03-28 DIAGNOSIS — C50211 Malignant neoplasm of upper-inner quadrant of right female breast: Secondary | ICD-10-CM | POA: Diagnosis not present

## 2018-03-28 DIAGNOSIS — D509 Iron deficiency anemia, unspecified: Secondary | ICD-10-CM | POA: Diagnosis not present

## 2018-03-28 DIAGNOSIS — Z95828 Presence of other vascular implants and grafts: Secondary | ICD-10-CM

## 2018-03-28 DIAGNOSIS — R5383 Other fatigue: Secondary | ICD-10-CM

## 2018-03-28 LAB — CMP (CANCER CENTER ONLY)
ALT: 25 U/L (ref 0–55)
ANION GAP: 12 — AB (ref 3–11)
AST: 16 U/L (ref 5–34)
Albumin: 3.9 g/dL (ref 3.5–5.0)
Alkaline Phosphatase: 85 U/L (ref 40–150)
BUN: 9 mg/dL (ref 7–26)
CO2: 26 mmol/L (ref 22–29)
Calcium: 9.4 mg/dL (ref 8.4–10.4)
Chloride: 102 mmol/L (ref 98–109)
Creatinine: 0.68 mg/dL (ref 0.60–1.10)
GFR, Est AFR Am: >60 mL/min (ref >60)
Glucose, Bld: 126 mg/dL (ref 70–140)
POTASSIUM: 3.7 mmol/L (ref 3.5–5.1)
Sodium: 140 mmol/L (ref 136–145)
TOTAL PROTEIN: 7 g/dL (ref 6.4–8.3)

## 2018-03-28 LAB — CBC WITH DIFFERENTIAL (CANCER CENTER ONLY)
BASOS ABS: 0 10*3/uL (ref 0.0–0.1)
Basophils Relative: 0 %
EOS ABS: 0 10*3/uL (ref 0.0–0.5)
EOS PCT: 0 %
HCT: 31.3 % — ABNORMAL LOW (ref 34.8–46.6)
Hemoglobin: 10.1 g/dL — ABNORMAL LOW (ref 11.6–15.9)
LYMPHS PCT: 6 %
Lymphs Abs: 0.4 10*3/uL — ABNORMAL LOW (ref 0.9–3.3)
MCH: 22.1 pg — ABNORMAL LOW (ref 25.1–34.0)
MCHC: 32.3 g/dL (ref 31.5–36.0)
MCV: 68.5 fL — AB (ref 79.5–101.0)
MONO ABS: 0.7 10*3/uL (ref 0.1–0.9)
Monocytes Relative: 12 %
Neutro Abs: 4.7 10*3/uL (ref 1.5–6.5)
Neutrophils Relative %: 82 %
PLATELETS: 169 10*3/uL (ref 145–400)
RBC: 4.57 MIL/uL (ref 3.70–5.45)
RDW: 19.5 % — ABNORMAL HIGH (ref 11.2–14.5)
WBC Count: 5.8 10*3/uL (ref 3.9–10.3)
nRBC: 1 /100 WBC — ABNORMAL HIGH

## 2018-03-28 MED ORDER — SODIUM CHLORIDE 0.9 % IV SOLN
Freq: Once | INTRAVENOUS | Status: AC
  Administered 2018-03-28: 12:00:00 via INTRAVENOUS

## 2018-03-28 MED ORDER — SODIUM CHLORIDE 0.9% FLUSH
10.0000 mL | INTRAVENOUS | Status: DC | PRN
  Administered 2018-03-28: 10 mL
  Filled 2018-03-28: qty 10

## 2018-03-28 MED ORDER — SODIUM CHLORIDE 0.9 % IV SOLN
20.0000 mg | Freq: Once | INTRAVENOUS | Status: AC
  Administered 2018-03-28: 20 mg via INTRAVENOUS
  Filled 2018-03-28: qty 2

## 2018-03-28 MED ORDER — HEPARIN SOD (PORK) LOCK FLUSH 100 UNIT/ML IV SOLN
500.0000 [IU] | Freq: Once | INTRAVENOUS | Status: AC | PRN
  Administered 2018-03-28: 500 [IU]
  Filled 2018-03-28: qty 5

## 2018-03-28 MED ORDER — SODIUM CHLORIDE 0.9% FLUSH
10.0000 mL | Freq: Once | INTRAVENOUS | Status: AC
  Administered 2018-03-28: 10 mL
  Filled 2018-03-28: qty 10

## 2018-03-28 MED ORDER — DIPHENHYDRAMINE HCL 50 MG/ML IJ SOLN
50.0000 mg | Freq: Once | INTRAMUSCULAR | Status: AC
  Administered 2018-03-28: 50 mg via INTRAVENOUS

## 2018-03-28 MED ORDER — FAMOTIDINE IN NACL 20-0.9 MG/50ML-% IV SOLN
INTRAVENOUS | Status: AC
  Filled 2018-03-28: qty 50

## 2018-03-28 MED ORDER — SODIUM CHLORIDE 0.9 % IV SOLN
80.0000 mg/m2 | Freq: Once | INTRAVENOUS | Status: AC
  Administered 2018-03-28: 126 mg via INTRAVENOUS
  Filled 2018-03-28: qty 21

## 2018-03-28 MED ORDER — DIPHENHYDRAMINE HCL 50 MG/ML IJ SOLN
INTRAMUSCULAR | Status: AC
  Filled 2018-03-28: qty 1

## 2018-03-28 MED ORDER — FAMOTIDINE IN NACL 20-0.9 MG/50ML-% IV SOLN
20.0000 mg | Freq: Once | INTRAVENOUS | Status: AC
  Administered 2018-03-28: 20 mg via INTRAVENOUS

## 2018-03-28 NOTE — Assessment & Plan Note (Signed)
01/10/2018:Palpable right breast mass with calcifications medial right breast at 1 o'clock position: 2.6 cm by ultrasound, 1 abnormal lymph node: Ultrasound biopsy of both the mass and the lymph node revealed grade 3 invasive ductal carcinoma ER 90%, PR 2%, Ki-67 70%, HER-2 negative ratio 0.96, T2 N1 stage II a AJCC 8 clinical stage  Recommendation: 1 Genetic testing.Staging scans 2.neoadjuvant chemotherapy with dose dense Adriamycin and Cytoxan followed by Taxol weekly x12 3.Followed by surgery depending on genetic test results and patient's preference 4.Followed by adjuvant radiation 5.Followed by antiestrogen therapy and evaluation on Natalee clinical trial ------------------------------------------------------------------------ Current treatment: Completed 4 cycles ofdose dense Adriamycin Cytoxan, today cycle 1 Taxol Echocardiogram3/28/2019: EF65 to 70%  Chemo toxicities: Denies any nausea or vomiting. [place]. In fact patient has tolerated chemotherapy extremely well.  Microcytic anemia: Iron studies show 13% saturation with a ferritin of 52 TIBC 308. Hemoglobin electrophoresis showed sickle cell trait heterozygous with hemoglobin S of 30.6%  Return to clinic in 1 week for cycle 2 of Taxol

## 2018-03-28 NOTE — Progress Notes (Signed)
Patient Care Team: [facility] as PCP - General Nicholas Lose, MD as Consulting Physician (Hematology and Oncology) Alphonsa Overall, MD as Consulting Physician (General Surgery) Gery Pray, MD as Consulting Physician (Radiation Oncology)  DIAGNOSIS:  Encounter Diagnosis  Name Primary?  . Malignant neoplasm of upper-inner quadrant of right breast in female, estrogen receptor positive ([place])     SUMMARY OF ONCOLOGIC HISTORY:   Malignant neoplasm of upper-inner quadrant of right breast in female, estrogen receptor positive ([place])   01/10/2018 Initial Diagnosis    Palpable right breast mass with calcifications medial right breast at 1 o'clock position: 2.6 cm by ultrasound, 1 abnormal lymph node: Ultrasound biopsy of both the mass and the lymph node revealed grade 3 invasive ductal carcinoma ER 90%, PR 2%, Ki-67 70%, HER-2 negative ratio 0.96, T2 N1 stage II a AJCC 8 clinical stage      01/29/2018 -  Neo-Adjuvant Chemotherapy    Dose dense Adriamycin and Cytoxan followed by Taxol x12       CHIEF COMPLIANT: Cycle 1 Taxol today  INTERVAL HISTORY: Kim Griffin is a 22 year old with above-mentioned history of right breast cancer being treated with neoadjuvant chemotherapy and today is cycle 1 of Taxol.  She tolerated 4 cycles of Adriamycin and Cytoxan extremely well.  She had fatigue as a major side effect along with alopecia.  She is concerned about discoloration of the nails as well as her tongue.  REVIEW OF SYSTEMS:   Constitutional: Denies fevers, chills or abnormal weight loss Eyes: Denies blurriness of vision Ears, nose, mouth, throat, and face: Discoloration of the tongue Respiratory: Denies cough, dyspnea or wheezes Cardiovascular: Denies palpitation, chest discomfort Gastrointestinal:  Denies nausea, heartburn or change in bowel habits Skin: Denies abnormal skin rashes Lymphatics: Denies new lymphadenopathy or easy bruising Neurological:Denies  numbness, tingling or new weaknesses Behavioral/Psych: Mood is stable, no new changes  Extremities: No lower extremity edema  All other systems were reviewed with the patient and are negative.  I have reviewed the past medical history, past surgical history, social history and family history with the patient and they are unchanged from previous note.  ALLERGIES:  has No Known Allergies.  MEDICATIONS:  Current Outpatient Medications  Medication Sig Dispense Refill  . dexamethasone (DECADRON) 4 MG tablet Take 1 tab day after chemo and 1 tab 2 days after chemo with food 10 tablet 0  . ferrous sulfate 325 (65 FE) MG tablet Take 325 mg by mouth daily with breakfast.    . HYDROcodone-acetaminophen (NORCO/VICODIN) 5-325 MG tablet Take 1 tablet by mouth every 6 (six) hours as needed for moderate pain. 20 tablet 0  . ibuprofen (ADVIL,MOTRIN) 800 MG tablet Take 800 mg by mouth every 8 (eight) hours as needed for mild pain or moderate pain.    Marland Kitchen lidocaine-prilocaine (EMLA) cream Apply to affected area once 30 g 3  . LORazepam (ATIVAN) 0.5 MG tablet Take 1 tablet (0.5 mg total) by mouth at bedtime as needed for sleep. 30 tablet 0  . ondansetron (ZOFRAN) 8 MG tablet Take 1 tablet (8 mg total) by mouth 2 (two) times daily as needed. Start on the third day after chemotherapy. 30 tablet 1  . prochlorperazine (COMPAZINE) 10 MG tablet Take 1 tablet (10 mg total) by mouth every 6 (six) hours as needed (Nausea or vomiting). 30 tablet 1   No current facility-administered medications for this visit.    Facility-Administered Medications Ordered in Other Visits  Medication Dose Route Frequency Provider  Last Rate Last Dose  . 0.9 %  sodium chloride infusion   Intravenous Once Nicholas Lose, MD      . dexamethasone (DECADRON) 20 mg in sodium chloride 0.9 % 50 mL IVPB  20 mg Intravenous Once Nicholas Lose, MD      . diphenhydrAMINE (BENADRYL) injection 50 mg  50 mg Intravenous Once Nicholas Lose, MD      .  famotidine (PEPCID) IVPB 20 mg premix  20 mg Intravenous Once Nicholas Lose, MD      . heparin lock flush 100 unit/mL  500 Units Intracatheter Once PRN Nicholas Lose, MD      . PACLitaxel (TAXOL) 126 mg in sodium chloride 0.9 % 250 mL chemo infusion (</= 46m/m2)  80 mg/m2 (Treatment Plan Recorded) Intravenous Once GNicholas Lose MD      . sodium chloride flush (NS) 0.9 % injection 10 mL  10 mL Intracatheter PRN GNicholas Lose MD        PHYSICAL EXAMINATION: ECOG PERFORMANCE STATUS: 1 - Symptomatic but completely ambulatory  Vitals:   03/28/18 1050  BP: 118/70  Pulse: (!) 101  Resp: 20  Temp: 98.3 F (36.8 C)  SpO2: 100%   Filed Weights   03/28/18 1050  Weight: 122 lb 8 oz (55.6 kg)    GENERAL:alert, no distress and comfortable SKIN: skin color, texture, turgor are normal, no rashes or significant lesions EYES: normal, Conjunctiva are pink and non-injected, sclera clear OROPHARYNX:no exudate, no erythema and lips, buccal mucosa, and tongue normal  NECK: supple, thyroid normal size, non-tender, without nodularity LYMPH:  no palpable lymphadenopathy in the cervical, axillary or inguinal LUNGS: clear to auscultation  HEART:  tachycardia ABDOMEN:abdomen soft, non-tender and normal bowel sounds MUSCULOSKELETAL:no cyanosis of digits and no clubbing  NEURO: alert & oriented x 3 with fluent speech, no focal motor/sensory deficits EXTREMITIES: No lower extremity edema  LABORATORY DATA:  I have reviewed the data as listed CMP Latest Ref Rng & Units 03/28/2018 03/12/2018 02/26/2018  Glucose 70 - 140 mg/dL 126 97 100  BUN 7 - 26 mg/dL _0 Creatinine 0.60 - 1.10 mg/dL 0.68 0.72 0.72  Sodium 136 - 145 mmol/L 140 141 140  Potassium 3.5 - 5.1 mmol/L 3.7 3.7 3.7  Chloride 98 - 109 mmol/L 102 104 105  CO2 22 - 29 mmol/L _1 Calcium 8.4 - 10.4 mg/dL 9.4 9.6 9.6  Total Protein 6.4 - 8.3 g/dL 7.0 7.2 7.2  Total Bilirubin 0.2 - 1.2 mg/dL <0.2(L) <0.2(L) <0.2(L)  Alkaline Phos 40 -  150 U/L 85 93 94  AST 5 - 34 U/L _2 ALT 0 - 55 U/L _3 Lab Results  Component Value Date   WBC 5.8 03/28/2018   HGB 10.1 (L) 03/28/2018   HCT 31.3 (L) 03/28/2018   MCV 68.5 (L) 03/28/2018   PLT 169 03/28/2018   NEUTROABS 4.7 03/28/2018    ASSESSMENT & PLAN:  Malignant neoplasm of upper-inner quadrant of right breast in [place], [place] 01/10/2018:Palpable right breast mass with calcifications medial right breast at 1 o'clock position: 2.6 cm by ultrasound, 1 abnormal lymph node: Ultrasound biopsy of both the mass and the lymph node revealed grade 3 invasive ductal carcinoma ER 90%, PR 2%, Ki-67 70%, HER-2 negative ratio 0.96, T2 N1 stage II a AJCC 8 clinical stage  Recommendation: 1 Genetic testing.Staging scans 2.neoadjuvant chemotherapy with dose dense Adriamycin and Cytoxan followed by Taxol weekly x12 3.Followed  by surgery depending on genetic test results and patient's preference 4.Followed by adjuvant radiation 5.Followed by antiestrogen therapy and evaluation on Natalee clinical trial ------------------------------------------------------------------------ Current treatment: Completed 4 cycles ofdose dense Adriamycin Cytoxan, today cycle 1 Taxol Echocardiogram3/28/2019: EF65 to 70%  Chemo toxicities: Denies any nausea or vomiting. [place]. In fact patient has tolerated chemotherapy extremely well.  Microcytic anemia: Iron studies show 13% saturation with a ferritin of 52 TIBC 308. Hemoglobin electrophoresis showed sickle cell trait heterozygous with hemoglobin S of 30.6%  Return to clinic weekly for Taxol in 2 weeks for follow-up with me   No orders of the defined types were placed in this encounter.  The patient has a good understanding of the overall plan. she agrees with it. she will call with any problems that may develop before the next visit here.   Harriette Ohara, MD 03/28/18

## 2018-03-28 NOTE — Patient Instructions (Signed)
[facility] Discharge Instructions for Patients Receiving Chemotherapy  Today you received the following chemotherapy agents taxol  To help prevent nausea and vomiting after your treatment, we encourage you to take your nausea medication as directed   If you develop nausea and vomiting that is not controlled by your nausea medication, call the clinic.   BELOW ARE SYMPTOMS THAT SHOULD BE REPORTED IMMEDIATELY:  *FEVER GREATER THAN 100.5 F  *CHILLS WITH OR WITHOUT FEVER  NAUSEA AND VOMITING THAT IS NOT CONTROLLED WITH YOUR NAUSEA MEDICATION  *UNUSUAL SHORTNESS OF BREATH  *UNUSUAL BRUISING OR BLEEDING  TENDERNESS IN MOUTH AND THROAT WITH OR WITHOUT PRESENCE OF ULCERS  *URINARY PROBLEMS  *BOWEL PROBLEMS  UNUSUAL RASH Items with * indicate a potential emergency and should be followed up as soon as possible.  Feel free to call the clinic you have any questions or concerns. The clinic phone number is [phone].  

## 2018-04-03 ENCOUNTER — Inpatient Hospital Stay: Payer: BLUE CROSS/BLUE SHIELD

## 2018-04-03 ENCOUNTER — Inpatient Hospital Stay: Payer: BLUE CROSS/BLUE SHIELD | Attending: Hematology and Oncology

## 2018-04-03 VITALS — BP 112/74 | HR 94 | Temp 98.4°F | Resp 16 | Wt 122.8 lb

## 2018-04-03 DIAGNOSIS — Z170 Estrogen receptor positive status [ER+]: Secondary | ICD-10-CM | POA: Diagnosis not present

## 2018-04-03 DIAGNOSIS — C50211 Malignant neoplasm of upper-inner quadrant of right female breast: Secondary | ICD-10-CM

## 2018-04-03 DIAGNOSIS — Z5189 Encounter for other specified aftercare: Secondary | ICD-10-CM | POA: Diagnosis not present

## 2018-04-03 DIAGNOSIS — Z79899 Other long term (current) drug therapy: Secondary | ICD-10-CM | POA: Diagnosis not present

## 2018-04-03 DIAGNOSIS — R5383 Other fatigue: Secondary | ICD-10-CM | POA: Diagnosis not present

## 2018-04-03 DIAGNOSIS — Z5111 Encounter for antineoplastic chemotherapy: Secondary | ICD-10-CM | POA: Diagnosis not present

## 2018-04-03 DIAGNOSIS — D649 Anemia, unspecified: Secondary | ICD-10-CM | POA: Insufficient documentation

## 2018-04-03 DIAGNOSIS — Z95828 Presence of other vascular implants and grafts: Secondary | ICD-10-CM

## 2018-04-03 LAB — CMP (CANCER CENTER ONLY)
ALBUMIN: 3.9 g/dL (ref 3.5–5.0)
ALT: 32 U/L (ref 0–55)
ANION GAP: 8 (ref 3–11)
AST: 22 U/L (ref 5–34)
Alkaline Phosphatase: 69 U/L (ref 40–150)
BUN: 9 mg/dL (ref 7–26)
CO2: 26 mmol/L (ref 22–29)
Calcium: 9.3 mg/dL (ref 8.4–10.4)
Chloride: 103 mmol/L (ref 98–109)
Creatinine: 0.73 mg/dL (ref 0.60–1.10)
GFR, Est AFR Am: >60 mL/min (ref >60)
GFR, Estimated: >60 mL/min (ref >60)
GLUCOSE: 127 mg/dL (ref 70–140)
Potassium: 3.6 mmol/L (ref 3.5–5.1)
SODIUM: 137 mmol/L (ref 136–145)
Total Bilirubin: 0.2 mg/dL (ref 0.2–1.2)
Total Protein: 6.9 g/dL (ref 6.4–8.3)

## 2018-04-03 LAB — CBC WITH DIFFERENTIAL (CANCER CENTER ONLY)
BASOS ABS: 0.1 10*3/uL (ref 0.0–0.1)
BASOS PCT: 2 %
Eosinophils Absolute: 0.1 10*3/uL (ref 0.0–0.5)
Eosinophils Relative: 3 %
HCT: 28.6 % — ABNORMAL LOW (ref 34.8–46.6)
HEMOGLOBIN: 9.4 g/dL — AB (ref 11.6–15.9)
Lymphocytes Relative: 8 %
Lymphs Abs: 0.4 10*3/uL — ABNORMAL LOW (ref 0.9–3.3)
MCH: 22.6 pg — ABNORMAL LOW (ref 25.1–34.0)
MCHC: 33 g/dL (ref 31.5–36.0)
MCV: 68.5 fL — ABNORMAL LOW (ref 79.5–101.0)
Monocytes Absolute: 0.7 10*3/uL (ref 0.1–0.9)
Monocytes Relative: 16 %
NEUTROS ABS: 3 10*3/uL (ref 1.5–6.5)
NEUTROS PCT: 71 %
Platelet Count: 348 10*3/uL (ref 145–400)
RBC: 4.17 MIL/uL (ref 3.70–5.45)
RDW: 20.1 % — ABNORMAL HIGH (ref 11.2–14.5)
WBC: 4.2 10*3/uL (ref 3.9–10.3)

## 2018-04-03 MED ORDER — DEXAMETHASONE SODIUM PHOSPHATE 100 MG/10ML IJ SOLN
20.0000 mg | Freq: Once | INTRAMUSCULAR | Status: AC
  Administered 2018-04-03: 20 mg via INTRAVENOUS
  Filled 2018-04-03: qty 2

## 2018-04-03 MED ORDER — SODIUM CHLORIDE 0.9 % IV SOLN
80.0000 mg/m2 | Freq: Once | INTRAVENOUS | Status: AC
  Administered 2018-04-03: 126 mg via INTRAVENOUS
  Filled 2018-04-03: qty 21

## 2018-04-03 MED ORDER — FAMOTIDINE IN NACL 20-0.9 MG/50ML-% IV SOLN
20.0000 mg | Freq: Once | INTRAVENOUS | Status: AC
  Administered 2018-04-03: 20 mg via INTRAVENOUS

## 2018-04-03 MED ORDER — DIPHENHYDRAMINE HCL 50 MG/ML IJ SOLN
INTRAMUSCULAR | Status: AC
  Filled 2018-04-03: qty 1

## 2018-04-03 MED ORDER — DIPHENHYDRAMINE HCL 50 MG/ML IJ SOLN
50.0000 mg | Freq: Once | INTRAMUSCULAR | Status: AC
  Administered 2018-04-03: 50 mg via INTRAVENOUS

## 2018-04-03 MED ORDER — SODIUM CHLORIDE 0.9% FLUSH
10.0000 mL | INTRAVENOUS | Status: DC | PRN
  Administered 2018-04-03: 10 mL
  Filled 2018-04-03: qty 10

## 2018-04-03 MED ORDER — HEPARIN SOD (PORK) LOCK FLUSH 100 UNIT/ML IV SOLN
500.0000 [IU] | Freq: Once | INTRAVENOUS | Status: AC | PRN
  Administered 2018-04-03: 500 [IU]
  Filled 2018-04-03: qty 5

## 2018-04-03 MED ORDER — SODIUM CHLORIDE 0.9 % IV SOLN
Freq: Once | INTRAVENOUS | Status: AC
  Administered 2018-04-03: 14:00:00 via INTRAVENOUS

## 2018-04-03 MED ORDER — SODIUM CHLORIDE 0.9% FLUSH
10.0000 mL | Freq: Once | INTRAVENOUS | Status: AC
  Administered 2018-04-03: 10 mL
  Filled 2018-04-03: qty 10

## 2018-04-03 MED ORDER — FAMOTIDINE IN NACL 20-0.9 MG/50ML-% IV SOLN
INTRAVENOUS | Status: AC
  Filled 2018-04-03: qty 50

## 2018-04-03 NOTE — Patient Instructions (Signed)
[facility] Discharge Instructions for Patients Receiving Chemotherapy  Today you received the following chemotherapy agents taxol  To help prevent nausea and vomiting after your treatment, we encourage you to take your nausea medication as directed   If you develop nausea and vomiting that is not controlled by your nausea medication, call the clinic.   BELOW ARE SYMPTOMS THAT SHOULD BE REPORTED IMMEDIATELY:  *FEVER GREATER THAN 100.5 F  *CHILLS WITH OR WITHOUT FEVER  NAUSEA AND VOMITING THAT IS NOT CONTROLLED WITH YOUR NAUSEA MEDICATION  *UNUSUAL SHORTNESS OF BREATH  *UNUSUAL BRUISING OR BLEEDING  TENDERNESS IN MOUTH AND THROAT WITH OR WITHOUT PRESENCE OF ULCERS  *URINARY PROBLEMS  *BOWEL PROBLEMS  UNUSUAL RASH Items with * indicate a potential emergency and should be followed up as soon as possible.  Feel free to call the clinic you have any questions or concerns. The clinic phone number is [phone].  

## 2018-04-09 NOTE — Progress Notes (Signed)
Patient Care Team: [facility] as PCP - General Nicholas Lose, MD as Consulting Physician (Hematology and Oncology) Alphonsa Overall, MD as Consulting Physician (General Surgery) Gery Pray, MD as Consulting Physician (Radiation Oncology)  DIAGNOSIS:  Encounter Diagnosis  Name Primary?  . Malignant neoplasm of upper-inner quadrant of right breast in female, estrogen receptor positive ([place])     SUMMARY OF ONCOLOGIC HISTORY:   Malignant neoplasm of upper-inner quadrant of right breast in female, estrogen receptor positive ([place])   01/10/2018 Initial Diagnosis    Palpable right breast mass with calcifications medial right breast at 1 o'clock position: 2.6 cm by ultrasound, 1 abnormal lymph node: Ultrasound biopsy of both the mass and the lymph node revealed grade 3 invasive ductal carcinoma ER 90%, PR 2%, Ki-67 70%, HER-2 negative ratio 0.96, T2 N1 stage II a AJCC 8 clinical stage      01/29/2018 -  Neo-Adjuvant Chemotherapy    Dose dense Adriamycin and Cytoxan followed by Taxol x12       CHIEF COMPLIANT: Cycle 3 Taxol  INTERVAL HISTORY: Kim Griffin is a 22 year old with above-mentioned history of right breast cancer currently neoadjuvant chemotherapy today cycle 3 of Taxol.  She is tolerating Taxol reasonably well.  Denies nausea vomiting.  She continues to have fatigue.  Denies neuropathy.  REVIEW OF SYSTEMS:   Constitutional: Denies fevers, chills or abnormal weight loss Eyes: Denies blurriness of vision Ears, nose, mouth, throat, and face: Denies mucositis or sore throat Respiratory: Denies cough, dyspnea or wheezes Cardiovascular: Denies palpitation, chest discomfort Gastrointestinal:  Denies nausea, heartburn or change in bowel habits Skin: Denies abnormal skin rashes Lymphatics: Denies new lymphadenopathy or easy bruising Neurological:Denies numbness, tingling or new weaknesses Behavioral/Psych: Mood is stable, no new changes  Extremities: No  lower extremity edema  All other systems were reviewed with the patient and are negative.  I have reviewed the past medical history, past surgical history, social history and family history with the patient and they are unchanged from previous note.  ALLERGIES:  has No Known Allergies.  MEDICATIONS:  Current Outpatient Medications  Medication Sig Dispense Refill  . dexamethasone (DECADRON) 4 MG tablet Take 1 tab day after chemo and 1 tab 2 days after chemo with food 10 tablet 0  . ferrous sulfate 325 (65 FE) MG tablet Take 325 mg by mouth daily with breakfast.    . HYDROcodone-acetaminophen (NORCO/VICODIN) 5-325 MG tablet Take 1 tablet by mouth every 6 (six) hours as needed for moderate pain. 20 tablet 0  . ibuprofen (ADVIL,MOTRIN) 800 MG tablet Take 800 mg by mouth every 8 (eight) hours as needed for mild pain or moderate pain.    Marland Kitchen lidocaine-prilocaine (EMLA) cream Apply to affected area once 30 g 3  . LORazepam (ATIVAN) 0.5 MG tablet Take 1 tablet (0.5 mg total) by mouth at bedtime as needed for sleep. 30 tablet 0  . ondansetron (ZOFRAN) 8 MG tablet Take 1 tablet (8 mg total) by mouth 2 (two) times daily as needed. Start on the third day after chemotherapy. 30 tablet 1  . prochlorperazine (COMPAZINE) 10 MG tablet Take 1 tablet (10 mg total) by mouth every 6 (six) hours as needed (Nausea or vomiting). 30 tablet 1   No current facility-administered medications for this visit.     PHYSICAL EXAMINATION: ECOG PERFORMANCE STATUS: 1 - Symptomatic but completely ambulatory  Vitals:   04/10/18 1118  BP: 107/63  Pulse: 100  Resp: 17  Temp: 97.8 F (36.6 C)  SpO2: 100%   Filed Weights   04/10/18 1118  Weight: 123 lb 14.4 oz (56.2 kg)    GENERAL:alert, no distress and comfortable SKIN: skin color, texture, turgor are normal, no rashes or significant lesions EYES: normal, Conjunctiva are pink and non-injected, sclera clear OROPHARYNX:no exudate, no erythema and lips, buccal mucosa,  and tongue normal  NECK: supple, thyroid normal size, non-tender, without nodularity LYMPH:  no palpable lymphadenopathy in the cervical, axillary or inguinal LUNGS: clear to auscultation and percussion with normal breathing effort HEART: regular rate & rhythm and no murmurs and no lower extremity edema ABDOMEN:abdomen soft, non-tender and normal bowel sounds MUSCULOSKELETAL:no cyanosis of digits and no clubbing  NEURO: alert & oriented x 3 with fluent speech, no focal motor/sensory deficits EXTREMITIES: No lower extremity edema  LABORATORY DATA:  I have reviewed the data as listed CMP Latest Ref Rng & Units 04/10/2018 04/03/2018 03/28/2018  Glucose 70 - 140 mg/dL 111 127 126  BUN 7 - 26 mg/dL _0 Creatinine 0.60 - 1.10 mg/dL 0.67 0.73 0.68  Sodium 136 - 145 mmol/L 139 137 140  Potassium 3.5 - 5.1 mmol/L 3.6 3.6 3.7  Chloride 98 - 109 mmol/L 104 103 102  CO2 22 - 29 mmol/L _1 Calcium 8.4 - 10.4 mg/dL 9.6 9.3 9.4  Total Protein 6.4 - 8.3 g/dL 7.0 6.9 7.0  Total Bilirubin 0.2 - 1.2 mg/dL 0.3 0.2 <0.2(L)  Alkaline Phos 40 - 150 U/L 68 69 85  AST 5 - 34 U/L _2 ALT 0 - 55 U/L 32 32 25    Lab Results  Component Value Date   WBC 4.0 04/10/2018   HGB 10.0 (L) 04/10/2018   HCT 30.1 (L) 04/10/2018   MCV 68.7 (L) 04/10/2018   PLT 340 04/10/2018   NEUTROABS 3.0 04/10/2018    ASSESSMENT & PLAN:  Malignant neoplasm of upper-inner quadrant of right breast in female, estrogen receptor positive ([place]) 01/10/2018:Palpable right breast mass with calcifications medial right breast at 1 o'clock position: 2.6 cm by ultrasound, 1 abnormal lymph node: Ultrasound biopsy of both the mass and the lymph node revealed grade 3 invasive ductal carcinoma ER 90%, PR 2%, Ki-67 70%, HER-2 negative ratio 0.96, T2 N1 stage II a AJCC 8 clinical stage  Recommendation: 1 Genetic testing: Negative.Staging scans: No metastatic disease 2.neoadjuvant chemotherapy with dose dense Adriamycin and Cytoxan  followed by Taxol weekly x12 3.Followed by surgery depending on genetic test results and patient's preference 4.Followed by adjuvant radiation 5.Followed by antiestrogen therapy and evaluation on Natalee clinical trial ------------------------------------------------------------------------ Current treatment: Completed 4 cycles ofdose dense Adriamycin Cytoxan, today cycle 3 Taxol Echocardiogram3/28/2019: EF65 to 70%  Chemo toxicities: Denies any nausea or vomiting. [place]. In fact patient has tolerated chemotherapy extremely well.  Microcytic anemia:Iron studies show 13% saturation with a ferritin of 52 TIBC 308. Hemoglobin electrophoresis showed sickle cell trait heterozygous with hemoglobin S of 30.6%  Return to clinic weekly for Taxol in 2 weeks for follow-up with me      No orders of the defined types were placed in this encounter.  The patient has a good understanding of the overall plan. she agrees with it. she will call with any problems that may develop before the next visit here.   Harriette Ohara, MD 04/10/18

## 2018-04-09 NOTE — Assessment & Plan Note (Signed)
01/10/2018:Palpable right breast mass with calcifications medial right breast at 1 o'clock position: 2.6 cm by ultrasound, 1 abnormal lymph node: Ultrasound biopsy of both the mass and the lymph node revealed grade 3 invasive ductal carcinoma ER 90%, PR 2%, Ki-67 70%, HER-2 negative ratio 0.96, T2 N1 stage II a AJCC 8 clinical stage  Recommendation: 1 Genetic testing: Negative.Staging scans: No metastatic disease 2.neoadjuvant chemotherapy with dose dense Adriamycin and Cytoxan followed by Taxol weekly x12 3.Followed by surgery depending on genetic test results and patient's preference 4.Followed by adjuvant radiation 5.Followed by antiestrogen therapy and evaluation on Natalee clinical trial ------------------------------------------------------------------------ Current treatment: Completed 4 cycles ofdose dense Adriamycin Cytoxan, today cycle 3 Taxol Echocardiogram3/28/2019: EF65 to 70%  Chemo toxicities: Denies any nausea or vomiting. [place]. In fact patient has tolerated chemotherapy extremely well.  Microcytic anemia:Iron studies show 13% saturation with a ferritin of 52 TIBC 308. Hemoglobin electrophoresis showed sickle cell trait heterozygous with hemoglobin S of 30.6%  Return to clinic weekly for Taxol in 2 weeks for follow-up with me

## 2018-04-10 ENCOUNTER — Inpatient Hospital Stay: Payer: BLUE CROSS/BLUE SHIELD

## 2018-04-10 ENCOUNTER — Inpatient Hospital Stay (HOSPITAL_BASED_OUTPATIENT_CLINIC_OR_DEPARTMENT_OTHER): Payer: BLUE CROSS/BLUE SHIELD | Admitting: Hematology and Oncology

## 2018-04-10 ENCOUNTER — Encounter: Payer: Self-pay | Admitting: *Deleted

## 2018-04-10 DIAGNOSIS — C50211 Malignant neoplasm of upper-inner quadrant of right female breast: Secondary | ICD-10-CM

## 2018-04-10 DIAGNOSIS — D649 Anemia, unspecified: Secondary | ICD-10-CM | POA: Diagnosis not present

## 2018-04-10 DIAGNOSIS — Z95828 Presence of other vascular implants and grafts: Secondary | ICD-10-CM

## 2018-04-10 DIAGNOSIS — Z170 Estrogen receptor positive status [ER+]: Principal | ICD-10-CM

## 2018-04-10 DIAGNOSIS — R5383 Other fatigue: Secondary | ICD-10-CM

## 2018-04-10 LAB — CBC WITH DIFFERENTIAL (CANCER CENTER ONLY)
BASOS ABS: 0.1 10*3/uL (ref 0.0–0.1)
Basophils Relative: 1 %
Eosinophils Absolute: 0.2 10*3/uL (ref 0.0–0.5)
Eosinophils Relative: 5 %
HEMATOCRIT: 30.1 % — AB (ref 34.8–46.6)
Hemoglobin: 10 g/dL — ABNORMAL LOW (ref 11.6–15.9)
LYMPHS PCT: 9 %
Lymphs Abs: 0.3 10*3/uL — ABNORMAL LOW (ref 0.9–3.3)
MCH: 22.7 pg — ABNORMAL LOW (ref 25.1–34.0)
MCHC: 33.1 g/dL (ref 31.5–36.0)
MCV: 68.7 fL — ABNORMAL LOW (ref 79.5–101.0)
Monocytes Absolute: 0.5 10*3/uL (ref 0.1–0.9)
Monocytes Relative: 12 %
NEUTROS ABS: 3 10*3/uL (ref 1.5–6.5)
Neutrophils Relative %: 73 %
Platelet Count: 340 10*3/uL (ref 145–400)
RBC: 4.39 MIL/uL (ref 3.70–5.45)
RDW: 21 % — ABNORMAL HIGH (ref 11.2–14.5)
WBC: 4 10*3/uL (ref 3.9–10.3)

## 2018-04-10 LAB — CMP (CANCER CENTER ONLY)
ALK PHOS: 68 U/L (ref 40–150)
ALT: 32 U/L (ref 0–55)
AST: 20 U/L (ref 5–34)
Albumin: 3.9 g/dL (ref 3.5–5.0)
Anion gap: 7 (ref 3–11)
BILIRUBIN TOTAL: 0.3 mg/dL (ref 0.2–1.2)
BUN: 9 mg/dL (ref 7–26)
CO2: 28 mmol/L (ref 22–29)
CREATININE: 0.67 mg/dL (ref 0.60–1.10)
Calcium: 9.6 mg/dL (ref 8.4–10.4)
Chloride: 104 mmol/L (ref 98–109)
GFR, Est AFR Am: >60 mL/min (ref >60)
GFR, Estimated: >60 mL/min (ref >60)
Glucose, Bld: 111 mg/dL (ref 70–140)
Potassium: 3.6 mmol/L (ref 3.5–5.1)
Sodium: 139 mmol/L (ref 136–145)
TOTAL PROTEIN: 7 g/dL (ref 6.4–8.3)

## 2018-04-10 MED ORDER — FAMOTIDINE IN NACL 20-0.9 MG/50ML-% IV SOLN
20.0000 mg | Freq: Once | INTRAVENOUS | Status: AC
  Administered 2018-04-10: 20 mg via INTRAVENOUS

## 2018-04-10 MED ORDER — HEPARIN SOD (PORK) LOCK FLUSH 100 UNIT/ML IV SOLN
500.0000 [IU] | Freq: Once | INTRAVENOUS | Status: AC | PRN
  Administered 2018-04-10: 500 [IU]
  Filled 2018-04-10: qty 5

## 2018-04-10 MED ORDER — DIPHENHYDRAMINE HCL 50 MG/ML IJ SOLN
INTRAMUSCULAR | Status: AC
  Filled 2018-04-10: qty 1

## 2018-04-10 MED ORDER — SODIUM CHLORIDE 0.9 % IV SOLN
Freq: Once | INTRAVENOUS | Status: AC
  Administered 2018-04-10: 12:00:00 via INTRAVENOUS

## 2018-04-10 MED ORDER — DIPHENHYDRAMINE HCL 50 MG/ML IJ SOLN
50.0000 mg | Freq: Once | INTRAMUSCULAR | Status: AC
  Administered 2018-04-10: 50 mg via INTRAVENOUS

## 2018-04-10 MED ORDER — GOSERELIN ACETATE 3.6 MG ~~LOC~~ IMPL
3.6000 mg | DRUG_IMPLANT | Freq: Once | SUBCUTANEOUS | Status: AC
  Administered 2018-04-10: 3.6 mg via SUBCUTANEOUS

## 2018-04-10 MED ORDER — SODIUM CHLORIDE 0.9% FLUSH
10.0000 mL | INTRAVENOUS | Status: DC | PRN
  Administered 2018-04-10: 10 mL
  Filled 2018-04-10: qty 10

## 2018-04-10 MED ORDER — SODIUM CHLORIDE 0.9% FLUSH
10.0000 mL | Freq: Once | INTRAVENOUS | Status: AC
  Administered 2018-04-10: 10 mL
  Filled 2018-04-10: qty 10

## 2018-04-10 MED ORDER — FAMOTIDINE IN NACL 20-0.9 MG/50ML-% IV SOLN
INTRAVENOUS | Status: AC
  Filled 2018-04-10: qty 50

## 2018-04-10 MED ORDER — PACLITAXEL CHEMO INJECTION 300 MG/50ML
80.0000 mg/m2 | Freq: Once | INTRAVENOUS | Status: AC
  Administered 2018-04-10: 126 mg via INTRAVENOUS
  Filled 2018-04-10: qty 21

## 2018-04-10 MED ORDER — GOSERELIN ACETATE 3.6 MG ~~LOC~~ IMPL
DRUG_IMPLANT | SUBCUTANEOUS | Status: AC
Start: 2018-04-10 — End: ?
  Filled 2018-04-10: qty 3.6

## 2018-04-10 MED ORDER — SODIUM CHLORIDE 0.9 % IV SOLN
20.0000 mg | Freq: Once | INTRAVENOUS | Status: AC
  Administered 2018-04-10: 20 mg via INTRAVENOUS
  Filled 2018-04-10: qty 2

## 2018-04-10 NOTE — Patient Instructions (Signed)
[facility] Discharge Instructions for Patients Receiving Chemotherapy  Today you received the following chemotherapy agents taxol  To help prevent nausea and vomiting after your treatment, we encourage you to take your nausea medication as directed   If you develop nausea and vomiting that is not controlled by your nausea medication, call the clinic.   BELOW ARE SYMPTOMS THAT SHOULD BE REPORTED IMMEDIATELY:  *FEVER GREATER THAN 100.5 F  *CHILLS WITH OR WITHOUT FEVER  NAUSEA AND VOMITING THAT IS NOT CONTROLLED WITH YOUR NAUSEA MEDICATION  *UNUSUAL SHORTNESS OF BREATH  *UNUSUAL BRUISING OR BLEEDING  TENDERNESS IN MOUTH AND THROAT WITH OR WITHOUT PRESENCE OF ULCERS  *URINARY PROBLEMS  *BOWEL PROBLEMS  UNUSUAL RASH Items with * indicate a potential emergency and should be followed up as soon as possible.  Feel free to call the clinic you have any questions or concerns. The clinic phone number is [phone].  

## 2018-04-17 ENCOUNTER — Inpatient Hospital Stay: Payer: BLUE CROSS/BLUE SHIELD

## 2018-04-17 VITALS — BP 115/72 | HR 114 | Temp 98.2°F | Resp 16

## 2018-04-17 DIAGNOSIS — Z170 Estrogen receptor positive status [ER+]: Principal | ICD-10-CM

## 2018-04-17 DIAGNOSIS — C50211 Malignant neoplasm of upper-inner quadrant of right female breast: Secondary | ICD-10-CM | POA: Diagnosis not present

## 2018-04-17 DIAGNOSIS — Z95828 Presence of other vascular implants and grafts: Secondary | ICD-10-CM

## 2018-04-17 LAB — CMP (CANCER CENTER ONLY)
ALK PHOS: 72 U/L (ref 40–150)
ALT: 50 U/L (ref 0–55)
ANION GAP: 9 (ref 3–11)
AST: 29 U/L (ref 5–34)
Albumin: 4.1 g/dL (ref 3.5–5.0)
BILIRUBIN TOTAL: 0.3 mg/dL (ref 0.2–1.2)
BUN: 13 mg/dL (ref 7–26)
CALCIUM: 9.9 mg/dL (ref 8.4–10.4)
CO2: 28 mmol/L (ref 22–29)
Chloride: 103 mmol/L (ref 98–109)
Creatinine: 0.76 mg/dL (ref 0.60–1.10)
GFR, Estimated: >60 mL/min (ref >60)
Glucose, Bld: 119 mg/dL (ref 70–140)
Potassium: 3.6 mmol/L (ref 3.5–5.1)
SODIUM: 140 mmol/L (ref 136–145)
TOTAL PROTEIN: 7.4 g/dL (ref 6.4–8.3)

## 2018-04-17 LAB — CBC WITH DIFFERENTIAL (CANCER CENTER ONLY)
BASOS ABS: 0.1 10*3/uL (ref 0.0–0.1)
BASOS PCT: 1 %
Eosinophils Absolute: 0.2 10*3/uL (ref 0.0–0.5)
Eosinophils Relative: 4 %
HEMATOCRIT: 31.4 % — AB (ref 34.8–46.6)
Hemoglobin: 10.2 g/dL — ABNORMAL LOW (ref 11.6–15.9)
Lymphocytes Relative: 6 %
Lymphs Abs: 0.3 10*3/uL — ABNORMAL LOW (ref 0.9–3.3)
MCH: 22.7 pg — ABNORMAL LOW (ref 25.1–34.0)
MCHC: 32.4 g/dL (ref 31.5–36.0)
MCV: 70.1 fL — ABNORMAL LOW (ref 79.5–101.0)
Monocytes Absolute: 0.5 10*3/uL (ref 0.1–0.9)
Monocytes Relative: 10 %
NEUTROS ABS: 4.2 10*3/uL (ref 1.5–6.5)
NEUTROS PCT: 79 %
Platelet Count: 344 10*3/uL (ref 145–400)
RBC: 4.48 MIL/uL (ref 3.70–5.45)
RDW: 21.5 % — ABNORMAL HIGH (ref 11.2–14.5)
WBC Count: 5.3 10*3/uL (ref 3.9–10.3)

## 2018-04-17 MED ORDER — SODIUM CHLORIDE 0.9 % IV SOLN
80.0000 mg/m2 | Freq: Once | INTRAVENOUS | Status: AC
  Administered 2018-04-17: 126 mg via INTRAVENOUS
  Filled 2018-04-17: qty 21

## 2018-04-17 MED ORDER — SODIUM CHLORIDE 0.9 % IV SOLN
Freq: Once | INTRAVENOUS | Status: AC
  Administered 2018-04-17: 14:00:00 via INTRAVENOUS

## 2018-04-17 MED ORDER — SODIUM CHLORIDE 0.9% FLUSH
10.0000 mL | Freq: Once | INTRAVENOUS | Status: AC
  Administered 2018-04-17: 10 mL
  Filled 2018-04-17: qty 10

## 2018-04-17 MED ORDER — DIPHENHYDRAMINE HCL 50 MG/ML IJ SOLN
50.0000 mg | Freq: Once | INTRAMUSCULAR | Status: AC
  Administered 2018-04-17: 50 mg via INTRAVENOUS

## 2018-04-17 MED ORDER — SODIUM CHLORIDE 0.9% FLUSH
10.0000 mL | INTRAVENOUS | Status: DC | PRN
  Administered 2018-04-17: 10 mL
  Filled 2018-04-17: qty 10

## 2018-04-17 MED ORDER — FAMOTIDINE IN NACL 20-0.9 MG/50ML-% IV SOLN
INTRAVENOUS | Status: AC
  Filled 2018-04-17: qty 50

## 2018-04-17 MED ORDER — HEPARIN SOD (PORK) LOCK FLUSH 100 UNIT/ML IV SOLN
500.0000 [IU] | Freq: Once | INTRAVENOUS | Status: AC | PRN
  Administered 2018-04-17: 500 [IU]
  Filled 2018-04-17: qty 5

## 2018-04-17 MED ORDER — SODIUM CHLORIDE 0.9 % IV SOLN
20.0000 mg | Freq: Once | INTRAVENOUS | Status: AC
  Administered 2018-04-17: 20 mg via INTRAVENOUS
  Filled 2018-04-17: qty 2

## 2018-04-17 MED ORDER — FAMOTIDINE IN NACL 20-0.9 MG/50ML-% IV SOLN
20.0000 mg | Freq: Once | INTRAVENOUS | Status: AC
  Administered 2018-04-17: 20 mg via INTRAVENOUS

## 2018-04-17 MED ORDER — DIPHENHYDRAMINE HCL 50 MG/ML IJ SOLN
INTRAMUSCULAR | Status: AC
  Filled 2018-04-17: qty 1

## 2018-04-17 NOTE — Patient Instructions (Signed)
[facility] Discharge Instructions for Patients Receiving Chemotherapy  Today you received the following chemotherapy agents Taxol.  To help prevent nausea and vomiting after your treatment, we encourage you to take your nausea medication as directed.    If you develop nausea and vomiting that is not controlled by your nausea medication, call the clinic.   BELOW ARE SYMPTOMS THAT SHOULD BE REPORTED IMMEDIATELY:  *FEVER GREATER THAN 100.5 F  *CHILLS WITH OR WITHOUT FEVER  NAUSEA AND VOMITING THAT IS NOT CONTROLLED WITH YOUR NAUSEA MEDICATION  *UNUSUAL SHORTNESS OF BREATH  *UNUSUAL BRUISING OR BLEEDING  TENDERNESS IN MOUTH AND THROAT WITH OR WITHOUT PRESENCE OF ULCERS  *URINARY PROBLEMS  *BOWEL PROBLEMS  UNUSUAL RASH Items with * indicate a potential emergency and should be followed up as soon as possible.  Feel free to call the clinic you have any questions or concerns. The clinic phone number is [phone].    

## 2018-04-17 NOTE — Progress Notes (Signed)
Okay to treat with HR 114 per Dr.Gudena. Pt instructed to drink plenty of fluids. Pt currently asymptomatic and other VS stable. Recheck VS prior to discharge.

## 2018-04-17 NOTE — Progress Notes (Signed)
Pt reported "tingling in pinky finger" about 5 minutes one day after last treatment. Pt reports no symptoms of neuropathy since. Spoke with Dr. Geralyn Flash RN May to notify Dr. Lindi Adie. Will continue to monitor.

## 2018-04-24 ENCOUNTER — Inpatient Hospital Stay: Payer: BLUE CROSS/BLUE SHIELD

## 2018-04-24 ENCOUNTER — Encounter: Payer: Self-pay | Admitting: *Deleted

## 2018-04-24 ENCOUNTER — Inpatient Hospital Stay (HOSPITAL_BASED_OUTPATIENT_CLINIC_OR_DEPARTMENT_OTHER): Payer: BLUE CROSS/BLUE SHIELD | Admitting: Hematology and Oncology

## 2018-04-24 ENCOUNTER — Telehealth: Payer: Self-pay | Admitting: Hematology and Oncology

## 2018-04-24 DIAGNOSIS — C50211 Malignant neoplasm of upper-inner quadrant of right female breast: Secondary | ICD-10-CM

## 2018-04-24 DIAGNOSIS — Z170 Estrogen receptor positive status [ER+]: Secondary | ICD-10-CM

## 2018-04-24 DIAGNOSIS — D649 Anemia, unspecified: Secondary | ICD-10-CM

## 2018-04-24 DIAGNOSIS — Z95828 Presence of other vascular implants and grafts: Secondary | ICD-10-CM

## 2018-04-24 LAB — CMP (CANCER CENTER ONLY)
ALT: 31 U/L (ref 0–44)
ANION GAP: 9 (ref 5–15)
AST: 20 U/L (ref 15–41)
Albumin: 3.9 g/dL (ref 3.5–5.0)
Alkaline Phosphatase: 76 U/L (ref 38–126)
BUN: 9 mg/dL (ref 6–20)
CO2: 26 mmol/L (ref 22–32)
Calcium: 9.6 mg/dL (ref 8.9–10.3)
Chloride: 104 mmol/L (ref 98–111)
Creatinine: 0.69 mg/dL (ref 0.44–1.00)
Glucose, Bld: 118 mg/dL — ABNORMAL HIGH (ref 70–99)
Potassium: 3.8 mmol/L (ref 3.5–5.1)
SODIUM: 139 mmol/L (ref 135–145)
Total Bilirubin: 0.2 mg/dL — ABNORMAL LOW (ref 0.3–1.2)
Total Protein: 7.1 g/dL (ref 6.5–8.1)

## 2018-04-24 LAB — CBC WITH DIFFERENTIAL (CANCER CENTER ONLY)
BASOS ABS: 0 10*3/uL (ref 0.0–0.1)
BASOS PCT: 1 %
Eosinophils Absolute: 0.2 10*3/uL (ref 0.0–0.5)
Eosinophils Relative: 4 %
HCT: 30 % — ABNORMAL LOW (ref 34.8–46.6)
HEMOGLOBIN: 10 g/dL — AB (ref 11.6–15.9)
Lymphocytes Relative: 7 %
Lymphs Abs: 0.3 10*3/uL — ABNORMAL LOW (ref 0.9–3.3)
MCH: 23.6 pg — ABNORMAL LOW (ref 25.1–34.0)
MCHC: 33.3 g/dL (ref 31.5–36.0)
MCV: 70.9 fL — ABNORMAL LOW (ref 79.5–101.0)
MONOS PCT: 9 %
Monocytes Absolute: 0.5 10*3/uL (ref 0.1–0.9)
NEUTROS ABS: 3.9 10*3/uL (ref 1.5–6.5)
NEUTROS PCT: 79 %
Platelet Count: 298 10*3/uL (ref 145–400)
RBC: 4.23 MIL/uL (ref 3.70–5.45)
RDW: 20.6 % — ABNORMAL HIGH (ref 11.2–14.5)
WBC: 4.9 10*3/uL (ref 3.9–10.3)

## 2018-04-24 MED ORDER — SODIUM CHLORIDE 0.9% FLUSH
10.0000 mL | Freq: Once | INTRAVENOUS | Status: AC
  Administered 2018-04-24: 10 mL
  Filled 2018-04-24: qty 10

## 2018-04-24 MED ORDER — FAMOTIDINE IN NACL 20-0.9 MG/50ML-% IV SOLN
INTRAVENOUS | Status: AC
  Filled 2018-04-24: qty 50

## 2018-04-24 MED ORDER — DIPHENHYDRAMINE HCL 50 MG/ML IJ SOLN
50.0000 mg | Freq: Once | INTRAMUSCULAR | Status: AC
  Administered 2018-04-24: 50 mg via INTRAVENOUS

## 2018-04-24 MED ORDER — SODIUM CHLORIDE 0.9 % IV SOLN
80.0000 mg/m2 | Freq: Once | INTRAVENOUS | Status: AC
  Administered 2018-04-24: 126 mg via INTRAVENOUS
  Filled 2018-04-24: qty 21

## 2018-04-24 MED ORDER — SODIUM CHLORIDE 0.9% FLUSH
10.0000 mL | INTRAVENOUS | Status: DC | PRN
  Administered 2018-04-24: 10 mL
  Filled 2018-04-24: qty 10

## 2018-04-24 MED ORDER — DIPHENHYDRAMINE HCL 50 MG/ML IJ SOLN
INTRAMUSCULAR | Status: AC
  Filled 2018-04-24: qty 1

## 2018-04-24 MED ORDER — FAMOTIDINE IN NACL 20-0.9 MG/50ML-% IV SOLN
20.0000 mg | Freq: Once | INTRAVENOUS | Status: AC
  Administered 2018-04-24: 20 mg via INTRAVENOUS

## 2018-04-24 MED ORDER — SODIUM CHLORIDE 0.9 % IV SOLN
Freq: Once | INTRAVENOUS | Status: AC
  Administered 2018-04-24: 12:00:00 via INTRAVENOUS

## 2018-04-24 MED ORDER — HEPARIN SOD (PORK) LOCK FLUSH 100 UNIT/ML IV SOLN
500.0000 [IU] | Freq: Once | INTRAVENOUS | Status: AC | PRN
  Administered 2018-04-24: 500 [IU]
  Filled 2018-04-24: qty 5

## 2018-04-24 MED ORDER — SODIUM CHLORIDE 0.9 % IV SOLN
20.0000 mg | Freq: Once | INTRAVENOUS | Status: AC
  Administered 2018-04-24: 20 mg via INTRAVENOUS
  Filled 2018-04-24: qty 2

## 2018-04-24 NOTE — Progress Notes (Signed)
Patient Care Team: [facility] as PCP - General Nicholas Lose, MD as Consulting Physician (Hematology and Oncology) Alphonsa Overall, MD as Consulting Physician (General Surgery) Gery Pray, MD as Consulting Physician (Radiation Oncology)  DIAGNOSIS:  Encounter Diagnosis  Name Primary?  . Malignant neoplasm of upper-inner quadrant of right breast in female, estrogen receptor positive ([place])     SUMMARY OF ONCOLOGIC HISTORY:   Malignant neoplasm of upper-inner quadrant of right breast in female, estrogen receptor positive ([place])   01/10/2018 Initial Diagnosis    Palpable right breast mass with calcifications medial right breast at 1 o'clock position: 2.6 cm by ultrasound, 1 abnormal lymph node: Ultrasound biopsy of both the mass and the lymph node revealed grade 3 invasive ductal carcinoma ER 90%, PR 2%, Ki-67 70%, HER-2 negative ratio 0.96, T2 N1 stage II a AJCC 8 clinical stage      01/29/2018 -  Neo-Adjuvant Chemotherapy    Dose dense Adriamycin and Cytoxan followed by Taxol x12       CHIEF COMPLIANT: Cycle 5 Taxol  INTERVAL HISTORY: Kim Griffin is a 22 year old with above-mentioned history of right breast cancer currently neoadjuvant chemotherapy and today is cycle 5 of Taxol.  She has been tolerating chemotherapy fairly well.  She does have mild fatigue.  Denies any nausea vomiting.  Denies neuropathy.  REVIEW OF SYSTEMS:   Constitutional: Denies fevers, chills or abnormal weight loss Eyes: Denies blurriness of vision Ears, nose, mouth, throat, and face: Denies mucositis or sore throat Respiratory: Denies cough, dyspnea or wheezes Cardiovascular: Denies palpitation, chest discomfort Gastrointestinal:  Denies nausea, heartburn or change in bowel habits Skin: Denies abnormal skin rashes Lymphatics: Denies new lymphadenopathy or easy bruising Neurological:Denies numbness, tingling or new weaknesses Behavioral/Psych: Mood is stable, no new changes    Extremities: No lower extremity edema Breast:  denies any pain or lumps or nodules in either breasts All other systems were reviewed with the patient and are negative.  I have reviewed the past medical history, past surgical history, social history and family history with the patient and they are unchanged from previous note.  ALLERGIES:  has No Known Allergies.  MEDICATIONS:  Current Outpatient Medications  Medication Sig Dispense Refill  . dexamethasone (DECADRON) 4 MG tablet Take 1 tab day after chemo and 1 tab 2 days after chemo with food 10 tablet 0  . ferrous sulfate 325 (65 FE) MG tablet Take 325 mg by mouth daily with breakfast.    . HYDROcodone-acetaminophen (NORCO/VICODIN) 5-325 MG tablet Take 1 tablet by mouth every 6 (six) hours as needed for moderate pain. 20 tablet 0  . ibuprofen (ADVIL,MOTRIN) 800 MG tablet Take 800 mg by mouth every 8 (eight) hours as needed for mild pain or moderate pain.    Marland Kitchen lidocaine-prilocaine (EMLA) cream Apply to affected area once 30 g 3  . LORazepam (ATIVAN) 0.5 MG tablet Take 1 tablet (0.5 mg total) by mouth at bedtime as needed for sleep. 30 tablet 0  . ondansetron (ZOFRAN) 8 MG tablet Take 1 tablet (8 mg total) by mouth 2 (two) times daily as needed. Start on the third day after chemotherapy. 30 tablet 1  . prochlorperazine (COMPAZINE) 10 MG tablet Take 1 tablet (10 mg total) by mouth every 6 (six) hours as needed (Nausea or vomiting). 30 tablet 1   No current facility-administered medications for this visit.     PHYSICAL EXAMINATION: ECOG PERFORMANCE STATUS: 1 - Symptomatic but completely ambulatory  Vitals:   04/24/18  1103  BP: 111/67  Pulse: 86  Resp: 20  Temp: 98.7 F (37.1 C)  SpO2: 100%   Filed Weights   04/24/18 1103  Weight: 123 lb 4.8 oz (55.9 kg)    GENERAL:alert, no distress and comfortable SKIN: skin color, texture, turgor are normal, no rashes or significant lesions EYES: normal, Conjunctiva are pink and  non-injected, sclera clear OROPHARYNX:no exudate, no erythema and lips, buccal mucosa, and tongue normal  NECK: supple, thyroid normal size, non-tender, without nodularity LYMPH:  no palpable lymphadenopathy in the cervical, axillary or inguinal LUNGS: clear to auscultation and percussion with normal breathing effort HEART: regular rate & rhythm and no murmurs and no lower extremity edema ABDOMEN:abdomen soft, non-tender and normal bowel sounds MUSCULOSKELETAL:no cyanosis of digits and no clubbing  NEURO: alert & oriented x 3 with fluent speech, no focal motor/sensory deficits EXTREMITIES: No lower extremity edema  LABORATORY DATA:  I have reviewed the data as listed CMP Latest Ref Rng & Units 04/17/2018 04/10/2018 04/03/2018  Glucose 70 - 140 mg/dL 119 111 127  BUN 7 - 26 mg/dL 13 9 9   Creatinine 0.60 - 1.10 mg/dL 0.76 0.67 0.73  Sodium 136 - 145 mmol/L 140 139 137  Potassium 3.5 - 5.1 mmol/L 3.6 3.6 3.6  Chloride 98 - 109 mmol/L 103 104 103  CO2 22 - 29 mmol/L 28 28 26   Calcium 8.4 - 10.4 mg/dL 9.9 9.6 9.3  Total Protein 6.4 - 8.3 g/dL 7.4 7.0 6.9  Total Bilirubin 0.2 - 1.2 mg/dL 0.3 0.3 0.2  Alkaline Phos 40 - 150 U/L 72 68 69  AST 5 - 34 U/L 29 20 22   ALT 0 - 55 U/L 50 32 32    Lab Results  Component Value Date   WBC 4.9 04/24/2018   HGB 10.0 (L) 04/24/2018   HCT 30.0 (L) 04/24/2018   MCV 70.9 (L) 04/24/2018   PLT 298 04/24/2018   NEUTROABS 3.9 04/24/2018    ASSESSMENT & PLAN:  Malignant neoplasm of upper-inner quadrant of right breast in female, estrogen receptor positive ([place]) 01/10/2018:Palpable right breast mass with calcifications medial right breast at 1 o'clock position: 2.6 cm by ultrasound, 1 abnormal lymph node: Ultrasound biopsy of both the mass and the lymph node revealed grade 3 invasive ductal carcinoma ER 90%, PR 2%, Ki-67 70%, HER-2 negative ratio 0.96, T2 N1 stage II a AJCC 8 clinical stage  Recommendation: 1 Genetic testing: Negative.Staging scans: No  metastatic disease 2.neoadjuvant chemotherapy with dose dense Adriamycin and Cytoxan followed by Taxol weekly x12 3.Followed by surgery depending on genetic test results and patient's preference 4.Followed by adjuvant radiation 5.Followed by antiestrogen therapy and evaluation on Natalee clinical trial ------------------------------------------------------------------------ Current treatment:Completed 4 cycles ofdose dense Adriamycin Cytoxan, today cycle 5 Taxol  Chemo toxicities: Denies any nausea or vomiting. [place]. In fact patient has tolerated chemotherapy extremely well.  Microcytic anemia:Iron studies show 13% saturation with a ferritin of 52 TIBC 308. Hemoglobin electrophoresis showed sickle cell trait heterozygous with hemoglobin S of 30.6% Hemoglobin is stable at 10 g.  Return to clinicweekly for Taxol in 2 weeks for follow-up with me  No orders of the defined types were placed in this encounter.  The patient has a good understanding of the overall plan. she agrees with it. she will call with any problems that may develop before the next visit here.   Harriette Ohara, MD 04/24/18

## 2018-04-24 NOTE — Telephone Encounter (Signed)
No  04/24/18 los

## 2018-04-24 NOTE — Patient Instructions (Signed)
[facility] Discharge Instructions for Patients Receiving Chemotherapy  Today you received the following chemotherapy agents:  Taxol.  To help prevent nausea and vomiting after your treatment, we encourage you to take your nausea medication as directed.   If you develop nausea and vomiting that is not controlled by your nausea medication, call the clinic.   BELOW ARE SYMPTOMS THAT SHOULD BE REPORTED IMMEDIATELY:  *FEVER GREATER THAN 100.5 F  *CHILLS WITH OR WITHOUT FEVER  NAUSEA AND VOMITING THAT IS NOT CONTROLLED WITH YOUR NAUSEA MEDICATION  *UNUSUAL SHORTNESS OF BREATH  *UNUSUAL BRUISING OR BLEEDING  TENDERNESS IN MOUTH AND THROAT WITH OR WITHOUT PRESENCE OF ULCERS  *URINARY PROBLEMS  *BOWEL PROBLEMS  UNUSUAL RASH Items with * indicate a potential emergency and should be followed up as soon as possible.  Feel free to call the clinic should you have any questions or concerns. The clinic phone number is [phone].  Please show the CHEMO ALERT CARD at check-in to the Emergency Department and triage nurse.   

## 2018-04-24 NOTE — Assessment & Plan Note (Signed)
01/10/2018:Palpable right breast mass with calcifications medial right breast at 1 o'clock position: 2.6 cm by ultrasound, 1 abnormal lymph node: Ultrasound biopsy of both the mass and the lymph node revealed grade 3 invasive ductal carcinoma ER 90%, PR 2%, Ki-67 70%, HER-2 negative ratio 0.96, T2 N1 stage II a AJCC 8 clinical stage  Recommendation: 1 Genetic testing: Negative.Staging scans: No metastatic disease 2.neoadjuvant chemotherapy with dose dense Adriamycin and Cytoxan followed by Taxol weekly x12 3.Followed by surgery depending on genetic test results and patient's preference 4.Followed by adjuvant radiation 5.Followed by antiestrogen therapy and evaluation on Natalee clinical trial ------------------------------------------------------------------------ Current treatment:Completed 4 cycles ofdose dense Adriamycin Cytoxan, today cycle 5 Taxol  Chemo toxicities: Denies any nausea or vomiting. [place]. In fact patient has tolerated chemotherapy extremely well.  Microcytic anemia:Iron studies show 13% saturation with a ferritin of 52 TIBC 308. Hemoglobin electrophoresis showed sickle cell trait heterozygous with hemoglobin S of 30.6%  Return to clinicweekly for Taxol in 2 weeks for follow-up with me

## 2018-05-01 ENCOUNTER — Inpatient Hospital Stay: Payer: BLUE CROSS/BLUE SHIELD | Attending: Hematology and Oncology

## 2018-05-01 ENCOUNTER — Inpatient Hospital Stay: Payer: BLUE CROSS/BLUE SHIELD

## 2018-05-01 VITALS — BP 110/64 | HR 88 | Temp 98.1°F | Resp 16

## 2018-05-01 DIAGNOSIS — Z170 Estrogen receptor positive status [ER+]: Secondary | ICD-10-CM

## 2018-05-01 DIAGNOSIS — C50211 Malignant neoplasm of upper-inner quadrant of right female breast: Secondary | ICD-10-CM

## 2018-05-01 DIAGNOSIS — Z95828 Presence of other vascular implants and grafts: Secondary | ICD-10-CM

## 2018-05-01 DIAGNOSIS — Z5111 Encounter for antineoplastic chemotherapy: Secondary | ICD-10-CM | POA: Diagnosis not present

## 2018-05-01 DIAGNOSIS — D509 Iron deficiency anemia, unspecified: Secondary | ICD-10-CM | POA: Insufficient documentation

## 2018-05-01 DIAGNOSIS — R5383 Other fatigue: Secondary | ICD-10-CM | POA: Diagnosis not present

## 2018-05-01 DIAGNOSIS — D573 Sickle-cell trait: Secondary | ICD-10-CM | POA: Diagnosis not present

## 2018-05-01 LAB — CBC WITH DIFFERENTIAL (CANCER CENTER ONLY)
BASOS ABS: 0 10*3/uL (ref 0.0–0.1)
BASOS PCT: 1 %
EOS PCT: 4 %
Eosinophils Absolute: 0.1 10*3/uL (ref 0.0–0.5)
HCT: 31.1 % — ABNORMAL LOW (ref 34.8–46.6)
Hemoglobin: 10 g/dL — ABNORMAL LOW (ref 11.6–15.9)
LYMPHS PCT: 10 %
Lymphs Abs: 0.3 10*3/uL — ABNORMAL LOW (ref 0.9–3.3)
MCH: 22.8 pg — ABNORMAL LOW (ref 25.1–34.0)
MCHC: 32.1 g/dL (ref 31.5–36.0)
MCV: 71.1 fL — ABNORMAL LOW (ref 79.5–101.0)
MONO ABS: 0.4 10*3/uL (ref 0.1–0.9)
MONOS PCT: 10 %
NEUTROS PCT: 75 %
Neutro Abs: 2.6 10*3/uL (ref 1.5–6.5)
PLATELETS: 306 10*3/uL (ref 145–400)
RBC: 4.38 MIL/uL (ref 3.70–5.45)
RDW: 20.2 % — AB (ref 11.2–14.5)
WBC Count: 3.4 10*3/uL — ABNORMAL LOW (ref 3.9–10.3)

## 2018-05-01 LAB — CMP (CANCER CENTER ONLY)
ALT: 31 U/L (ref 0–44)
AST: 22 U/L (ref 15–41)
Albumin: 3.8 g/dL (ref 3.5–5.0)
Alkaline Phosphatase: 73 U/L (ref 38–126)
Anion gap: 7 (ref 5–15)
BILIRUBIN TOTAL: 0.3 mg/dL (ref 0.3–1.2)
BUN: 6 mg/dL (ref 6–20)
CHLORIDE: 102 mmol/L (ref 98–111)
CO2: 29 mmol/L (ref 22–32)
CREATININE: 0.72 mg/dL (ref 0.44–1.00)
Calcium: 9.9 mg/dL (ref 8.9–10.3)
Glucose, Bld: 126 mg/dL — ABNORMAL HIGH (ref 70–99)
POTASSIUM: 3.5 mmol/L (ref 3.5–5.1)
Sodium: 138 mmol/L (ref 135–145)
TOTAL PROTEIN: 6.9 g/dL (ref 6.5–8.1)

## 2018-05-01 MED ORDER — SODIUM CHLORIDE 0.9 % IV SOLN
Freq: Once | INTRAVENOUS | Status: AC
  Administered 2018-05-01: 15:00:00 via INTRAVENOUS

## 2018-05-01 MED ORDER — DIPHENHYDRAMINE HCL 50 MG/ML IJ SOLN
50.0000 mg | Freq: Once | INTRAMUSCULAR | Status: AC
  Administered 2018-05-01: 50 mg via INTRAVENOUS

## 2018-05-01 MED ORDER — HEPARIN SOD (PORK) LOCK FLUSH 100 UNIT/ML IV SOLN
500.0000 [IU] | Freq: Once | INTRAVENOUS | Status: AC | PRN
  Administered 2018-05-01: 500 [IU]
  Filled 2018-05-01: qty 5

## 2018-05-01 MED ORDER — DIPHENHYDRAMINE HCL 50 MG/ML IJ SOLN
INTRAMUSCULAR | Status: AC
  Filled 2018-05-01: qty 1

## 2018-05-01 MED ORDER — SODIUM CHLORIDE 0.9% FLUSH
10.0000 mL | INTRAVENOUS | Status: DC | PRN
  Administered 2018-05-01: 10 mL
  Filled 2018-05-01: qty 10

## 2018-05-01 MED ORDER — SODIUM CHLORIDE 0.9% FLUSH
10.0000 mL | Freq: Once | INTRAVENOUS | Status: AC
  Administered 2018-05-01: 10 mL
  Filled 2018-05-01: qty 10

## 2018-05-01 MED ORDER — FAMOTIDINE IN NACL 20-0.9 MG/50ML-% IV SOLN
INTRAVENOUS | Status: AC
  Filled 2018-05-01: qty 50

## 2018-05-01 MED ORDER — FAMOTIDINE IN NACL 20-0.9 MG/50ML-% IV SOLN
20.0000 mg | Freq: Once | INTRAVENOUS | Status: AC
  Administered 2018-05-01: 20 mg via INTRAVENOUS

## 2018-05-01 MED ORDER — SODIUM CHLORIDE 0.9 % IV SOLN
80.0000 mg/m2 | Freq: Once | INTRAVENOUS | Status: AC
  Administered 2018-05-01: 126 mg via INTRAVENOUS
  Filled 2018-05-01: qty 21

## 2018-05-01 MED ORDER — SODIUM CHLORIDE 0.9 % IV SOLN
20.0000 mg | Freq: Once | INTRAVENOUS | Status: AC
  Administered 2018-05-01: 20 mg via INTRAVENOUS
  Filled 2018-05-01: qty 2

## 2018-05-01 NOTE — Patient Instructions (Signed)
[facility] Discharge Instructions for Patients Receiving Chemotherapy  Today you received the following chemotherapy agents:  Taxol (paclitaxel)  To help prevent nausea and vomiting after your treatment, we encourage you to take your nausea medication as prescribed.   If you develop nausea and vomiting that is not controlled by your nausea medication, call the clinic.   BELOW ARE SYMPTOMS THAT SHOULD BE REPORTED IMMEDIATELY:  *FEVER GREATER THAN 100.5 F  *CHILLS WITH OR WITHOUT FEVER  NAUSEA AND VOMITING THAT IS NOT CONTROLLED WITH YOUR NAUSEA MEDICATION  *UNUSUAL SHORTNESS OF BREATH  *UNUSUAL BRUISING OR BLEEDING  TENDERNESS IN MOUTH AND THROAT WITH OR WITHOUT PRESENCE OF ULCERS  *URINARY PROBLEMS  *BOWEL PROBLEMS  UNUSUAL RASH Items with * indicate a potential emergency and should be followed up as soon as possible.  Feel free to call the clinic should you have any questions or concerns. The clinic phone number is [phone].  Please show the CHEMO ALERT CARD at check-in to the Emergency Department and triage nurse.   

## 2018-05-04 ENCOUNTER — Other Ambulatory Visit: Payer: Self-pay | Admitting: Hematology and Oncology

## 2018-05-08 ENCOUNTER — Encounter: Payer: Self-pay | Admitting: *Deleted

## 2018-05-08 ENCOUNTER — Inpatient Hospital Stay (HOSPITAL_BASED_OUTPATIENT_CLINIC_OR_DEPARTMENT_OTHER): Payer: BLUE CROSS/BLUE SHIELD | Admitting: Hematology and Oncology

## 2018-05-08 ENCOUNTER — Inpatient Hospital Stay: Payer: BLUE CROSS/BLUE SHIELD

## 2018-05-08 ENCOUNTER — Other Ambulatory Visit: Payer: Self-pay | Admitting: Hematology and Oncology

## 2018-05-08 DIAGNOSIS — C50211 Malignant neoplasm of upper-inner quadrant of right female breast: Secondary | ICD-10-CM | POA: Diagnosis not present

## 2018-05-08 DIAGNOSIS — Z170 Estrogen receptor positive status [ER+]: Secondary | ICD-10-CM | POA: Diagnosis not present

## 2018-05-08 DIAGNOSIS — D509 Iron deficiency anemia, unspecified: Secondary | ICD-10-CM | POA: Diagnosis not present

## 2018-05-08 DIAGNOSIS — R5383 Other fatigue: Secondary | ICD-10-CM

## 2018-05-08 DIAGNOSIS — Z95828 Presence of other vascular implants and grafts: Secondary | ICD-10-CM

## 2018-05-08 LAB — CMP (CANCER CENTER ONLY)
ALT: 27 U/L (ref 0–44)
AST: 19 U/L (ref 15–41)
Albumin: 3.9 g/dL (ref 3.5–5.0)
Alkaline Phosphatase: 75 U/L (ref 38–126)
Anion gap: 8 (ref 5–15)
BUN: 11 mg/dL (ref 6–20)
CHLORIDE: 102 mmol/L (ref 98–111)
CO2: 30 mmol/L (ref 22–32)
CREATININE: 0.77 mg/dL (ref 0.44–1.00)
Calcium: 9.8 mg/dL (ref 8.9–10.3)
GFR, Est AFR Am: >60 mL/min (ref >60)
GFR, Estimated: >60 mL/min (ref >60)
GLUCOSE: 112 mg/dL — AB (ref 70–99)
POTASSIUM: 3.7 mmol/L (ref 3.5–5.1)
SODIUM: 140 mmol/L (ref 135–145)
Total Bilirubin: 0.3 mg/dL (ref 0.3–1.2)
Total Protein: 7.1 g/dL (ref 6.5–8.1)

## 2018-05-08 LAB — CBC WITH DIFFERENTIAL (CANCER CENTER ONLY)
Basophils Absolute: 0 10*3/uL (ref 0.0–0.1)
Basophils Relative: 1 %
EOS ABS: 0.1 10*3/uL (ref 0.0–0.5)
EOS PCT: 3 %
HCT: 30 % — ABNORMAL LOW (ref 34.8–46.6)
Hemoglobin: 9.9 g/dL — ABNORMAL LOW (ref 11.6–15.9)
LYMPHS ABS: 0.3 10*3/uL — AB (ref 0.9–3.3)
LYMPHS PCT: 10 %
MCH: 23.3 pg — ABNORMAL LOW (ref 25.1–34.0)
MCHC: 33 g/dL (ref 31.5–36.0)
MCV: 70.5 fL — AB (ref 79.5–101.0)
MONOS PCT: 12 %
Monocytes Absolute: 0.4 10*3/uL (ref 0.1–0.9)
Neutro Abs: 2.4 10*3/uL (ref 1.5–6.5)
Neutrophils Relative %: 74 %
PLATELETS: 324 10*3/uL (ref 145–400)
RBC: 4.25 MIL/uL (ref 3.70–5.45)
RDW: 19.5 % — ABNORMAL HIGH (ref 11.2–14.5)
WBC: 3.3 10*3/uL — AB (ref 3.9–10.3)

## 2018-05-08 MED ORDER — DIPHENHYDRAMINE HCL 50 MG/ML IJ SOLN
INTRAMUSCULAR | Status: AC
  Filled 2018-05-08: qty 1

## 2018-05-08 MED ORDER — FAMOTIDINE IN NACL 20-0.9 MG/50ML-% IV SOLN
20.0000 mg | Freq: Once | INTRAVENOUS | Status: AC
  Administered 2018-05-08: 20 mg via INTRAVENOUS

## 2018-05-08 MED ORDER — SODIUM CHLORIDE 0.9% FLUSH
10.0000 mL | Freq: Once | INTRAVENOUS | Status: AC
  Administered 2018-05-08: 10 mL
  Filled 2018-05-08: qty 10

## 2018-05-08 MED ORDER — GOSERELIN ACETATE 3.6 MG ~~LOC~~ IMPL
DRUG_IMPLANT | SUBCUTANEOUS | Status: AC
  Filled 2018-05-08: qty 3.6

## 2018-05-08 MED ORDER — FAMOTIDINE IN NACL 20-0.9 MG/50ML-% IV SOLN
INTRAVENOUS | Status: AC
  Filled 2018-05-08: qty 50

## 2018-05-08 MED ORDER — SODIUM CHLORIDE 0.9 % IV SOLN
20.0000 mg | Freq: Once | INTRAVENOUS | Status: AC
  Administered 2018-05-08: 20 mg via INTRAVENOUS
  Filled 2018-05-08: qty 2

## 2018-05-08 MED ORDER — GOSERELIN ACETATE 3.6 MG ~~LOC~~ IMPL
3.6000 mg | DRUG_IMPLANT | Freq: Once | SUBCUTANEOUS | Status: AC
  Administered 2018-05-08: 3.6 mg via SUBCUTANEOUS

## 2018-05-08 MED ORDER — HEPARIN SOD (PORK) LOCK FLUSH 100 UNIT/ML IV SOLN
500.0000 [IU] | Freq: Once | INTRAVENOUS | Status: AC | PRN
  Administered 2018-05-08: 500 [IU]
  Filled 2018-05-08: qty 5

## 2018-05-08 MED ORDER — SODIUM CHLORIDE 0.9 % IV SOLN
80.0000 mg/m2 | Freq: Once | INTRAVENOUS | Status: AC
  Administered 2018-05-08: 126 mg via INTRAVENOUS
  Filled 2018-05-08: qty 21

## 2018-05-08 MED ORDER — SODIUM CHLORIDE 0.9% FLUSH
10.0000 mL | INTRAVENOUS | Status: DC | PRN
  Administered 2018-05-08: 10 mL
  Filled 2018-05-08: qty 10

## 2018-05-08 MED ORDER — SODIUM CHLORIDE 0.9 % IV SOLN
Freq: Once | INTRAVENOUS | Status: AC
  Administered 2018-05-08: 12:00:00 via INTRAVENOUS

## 2018-05-08 MED ORDER — DIPHENHYDRAMINE HCL 50 MG/ML IJ SOLN
50.0000 mg | Freq: Once | INTRAMUSCULAR | Status: AC
  Administered 2018-05-08: 50 mg via INTRAVENOUS

## 2018-05-08 NOTE — Progress Notes (Signed)
Patient Care Team: [facility] as PCP - General Nicholas Lose, MD as Consulting Physician (Hematology and Oncology) Alphonsa Overall, MD as Consulting Physician (General Surgery) Gery Pray, MD as Consulting Physician (Radiation Oncology)  DIAGNOSIS:  Encounter Diagnosis  Name Primary?  . Malignant neoplasm of upper-inner quadrant of right breast in female, estrogen receptor positive ([place])     SUMMARY OF ONCOLOGIC HISTORY:   Malignant neoplasm of upper-inner quadrant of right breast in female, estrogen receptor positive ([place])   01/10/2018 Initial Diagnosis    Palpable right breast mass with calcifications medial right breast at 1 o'clock position: 2.6 cm by ultrasound, 1 abnormal lymph node: Ultrasound biopsy of both the mass and the lymph node revealed grade 3 invasive ductal carcinoma ER 90%, PR 2%, Ki-67 70%, HER-2 negative ratio 0.96, T2 N1 stage II a AJCC 8 clinical stage      01/29/2018 -  Neo-Adjuvant Chemotherapy    Dose dense Adriamycin and Cytoxan followed by Taxol x12       CHIEF COMPLIANT: Cycle 7 Taxol  INTERVAL HISTORY: Kim Griffin is a 22 year old with above-mentioned history of right breast cancer is currently neoadjuvant chemotherapy with weekly Taxol.  She is tolerating Taxol extremely well.  Apart from nail discoloration she has no side effects from treatment.  Denies any nausea vomiting.  Denies neuropathy.  REVIEW OF SYSTEMS:   Constitutional: Denies fevers, chills or abnormal weight loss Eyes: Denies blurriness of vision Ears, nose, mouth, throat, and face: Denies mucositis or sore throat Respiratory: Denies cough, dyspnea or wheezes Cardiovascular: Denies palpitation, chest discomfort Gastrointestinal:  Denies nausea, heartburn or change in bowel habits Skin: Denies abnormal skin rashes Lymphatics: Denies new lymphadenopathy or easy bruising Neurological:Denies numbness, tingling or new weaknesses Behavioral/Psych: Mood is  stable, no new changes  Extremities: No lower extremity edema All other systems were reviewed with the patient and are negative.  I have reviewed the past medical history, past surgical history, social history and family history with the patient and they are unchanged from previous note.  ALLERGIES:  has No Known Allergies.  MEDICATIONS:  Current Outpatient Medications  Medication Sig Dispense Refill  . dexamethasone (DECADRON) 4 MG tablet Take 1 tab day after chemo and 1 tab 2 days after chemo with food 10 tablet 0  . ferrous sulfate 325 (65 FE) MG tablet Take 325 mg by mouth daily with breakfast.    . HYDROcodone-acetaminophen (NORCO/VICODIN) 5-325 MG tablet Take 1 tablet by mouth every 6 (six) hours as needed for moderate pain. 20 tablet 0  . ibuprofen (ADVIL,MOTRIN) 800 MG tablet Take 800 mg by mouth every 8 (eight) hours as needed for mild pain or moderate pain.    Marland Kitchen lidocaine-prilocaine (EMLA) cream Apply to affected area once 30 g 3  . LORazepam (ATIVAN) 0.5 MG tablet Take 1 tablet (0.5 mg total) by mouth at bedtime as needed for sleep. 30 tablet 0  . ondansetron (ZOFRAN) 8 MG tablet Take 1 tablet (8 mg total) by mouth 2 (two) times daily as needed. Start on the third day after chemotherapy. 30 tablet 1  . prochlorperazine (COMPAZINE) 10 MG tablet Take 1 tablet (10 mg total) by mouth every 6 (six) hours as needed (Nausea or vomiting). 30 tablet 1   No current facility-administered medications for this visit.     PHYSICAL EXAMINATION: ECOG PERFORMANCE STATUS: 0 - Asymptomatic  Vitals:   05/08/18 1133  BP: 111/68  Pulse: 86  Resp: 20  Temp: 97.9 F (  36.6 C)  SpO2: 100%   Filed Weights   05/08/18 1133  Weight: 123 lb (55.8 kg)    GENERAL:alert, no distress and comfortable SKIN: skin color, texture, turgor are normal, no rashes or significant lesions EYES: normal, Conjunctiva are pink and non-injected, sclera clear OROPHARYNX:no exudate, no erythema and lips, buccal  mucosa, and tongue normal  NECK: supple, thyroid normal size, non-tender, without nodularity LYMPH:  no palpable lymphadenopathy in the cervical, axillary or inguinal LUNGS: clear to auscultation and percussion with normal breathing effort HEART: regular rate & rhythm and no murmurs and no lower extremity edema ABDOMEN:abdomen soft, non-tender and normal bowel sounds MUSCULOSKELETAL:no cyanosis of digits and no clubbing  NEURO: alert & oriented x 3 with fluent speech, no focal motor/sensory deficits EXTREMITIES: No lower extremity edema    LABORATORY DATA:  I have reviewed the data as listed CMP Latest Ref Rng & Units 05/01/2018 04/24/2018 04/17/2018  Glucose 70 - 99 mg/dL 126(H) 118(H) 119  BUN 6 - 20 mg/dL _0 Creatinine 0.44 - 1.00 mg/dL 0.72 0.69 0.76  Sodium 135 - 145 mmol/L 138 139 140  Potassium 3.5 - 5.1 mmol/L 3.5 3.8 3.6  Chloride 98 - 111 mmol/L 102 104 103  CO2 22 - 32 mmol/L _1 Calcium 8.9 - 10.3 mg/dL 9.9 9.6 9.9  Total Protein 6.5 - 8.1 g/dL 6.9 7.1 7.4  Total Bilirubin 0.3 - 1.2 mg/dL 0.3 0.2(L) 0.3  Alkaline Phos 38 - 126 U/L 73 76 72  AST 15 - 41 U/L _2 ALT 0 - 44 U/L 31 31 50    Lab Results  Component Value Date   WBC 3.3 (L) 05/08/2018   HGB 9.9 (L) 05/08/2018   HCT 30.0 (L) 05/08/2018   MCV 70.5 (L) 05/08/2018   PLT 324 05/08/2018   NEUTROABS 2.4 05/08/2018    ASSESSMENT & PLAN:  Malignant neoplasm of upper-inner quadrant of right breast in female, estrogen receptor positive ([place]) 01/10/2018:Palpable right breast mass with calcifications medial right breast at 1 o'clock position: 2.6 cm by ultrasound, 1 abnormal lymph node: Ultrasound biopsy of both the mass and the lymph node revealed grade 3 invasive ductal carcinoma ER 90%, PR 2%, Ki-67 70%, HER-2 negative ratio 0.96, T2 N1 stage II a AJCC 8 clinical stage  Recommendation: 1 Genetic testing: Negative.Staging scans: No metastatic disease 2.neoadjuvant chemotherapy with dose dense  Adriamycin and Cytoxan followed by Taxol weekly x12 3.Followed by surgery depending on genetic test results and patient's preference 4.Followed by adjuvant radiation 5.Followed by antiestrogen therapy and evaluation on Natalee clinical trial ------------------------------------------------------------------------ Current treatment:Completed 4 cycles ofdose dense Adriamycin Cytoxan, today cycle 7 Taxol  Chemo toxicities: Denies any nausea or vomiting. [place]. In fact patient has tolerated chemotherapy extremely well.  Microcytic anemia:Iron studies show 13% saturation with a ferritin of 52 TIBC 308. Hemoglobin electrophoresis showed sickle cell trait heterozygous with hemoglobin S of 30.6%  Return to clinicweekly for Taxol in 3 weeks for follow-up with me  No orders of the defined types were placed in this encounter.  The patient has a good understanding of the overall plan. she agrees with it. she will call with any problems that may develop before the next visit here.   Harriette Ohara, MD 05/08/18

## 2018-05-08 NOTE — Assessment & Plan Note (Signed)
01/10/2018:Palpable right breast mass with calcifications medial right breast at 1 o'clock position: 2.6 cm by ultrasound, 1 abnormal lymph node: Ultrasound biopsy of both the mass and the lymph node revealed grade 3 invasive ductal carcinoma ER 90%, PR 2%, Ki-67 70%, HER-2 negative ratio 0.96, T2 N1 stage II a AJCC 8 clinical stage  Recommendation: 1 Genetic testing: Negative.Staging scans: No metastatic disease 2.neoadjuvant chemotherapy with dose dense Adriamycin and Cytoxan followed by Taxol weekly x12 3.Followed by surgery depending on genetic test results and patient's preference 4.Followed by adjuvant radiation 5.Followed by antiestrogen therapy and evaluation on Natalee clinical trial ------------------------------------------------------------------------ Current treatment:Completed 4 cycles ofdose dense Adriamycin Cytoxan, today cycle 7 Taxol  Chemo toxicities: Denies any nausea or vomiting. [place]. In fact patient has tolerated chemotherapy extremely well.  Microcytic anemia:Iron studies show 13% saturation with a ferritin of 52 TIBC 308. Hemoglobin electrophoresis showed sickle cell trait heterozygous with hemoglobin S of 30.6%  Return to clinicweekly for Taxol in 2 weeks for follow-up with me

## 2018-05-08 NOTE — Patient Instructions (Signed)
[facility] Discharge Instructions for Patients Receiving Chemotherapy  Today you received the following chemotherapy agents:  Taxol (paclitaxel)  To help prevent nausea and vomiting after your treatment, we encourage you to take your nausea medication as prescribed.   If you develop nausea and vomiting that is not controlled by your nausea medication, call the clinic.   BELOW ARE SYMPTOMS THAT SHOULD BE REPORTED IMMEDIATELY:  *FEVER GREATER THAN 100.5 F  *CHILLS WITH OR WITHOUT FEVER  NAUSEA AND VOMITING THAT IS NOT CONTROLLED WITH YOUR NAUSEA MEDICATION  *UNUSUAL SHORTNESS OF BREATH  *UNUSUAL BRUISING OR BLEEDING  TENDERNESS IN MOUTH AND THROAT WITH OR WITHOUT PRESENCE OF ULCERS  *URINARY PROBLEMS  *BOWEL PROBLEMS  UNUSUAL RASH Items with * indicate a potential emergency and should be followed up as soon as possible.  Feel free to call the clinic should you have any questions or concerns. The clinic phone number is [phone].  Please show the [place] at check-in to the Emergency Department and triage nurse.  Goserelin injection What is this medicine? GOSERELIN (GOE se rel in) is similar to a hormone found in the body. It lowers the amount of sex hormones that the body makes. Men will have lower testosterone levels and women will have lower estrogen levels while taking this medicine. In men, this medicine is used to treat prostate cancer; the injection is either given once per month or once every 12 weeks. A once per month injection (only) is used to treat women with endometriosis, dysfunctional uterine bleeding, or advanced breast cancer. This medicine may be used for other purposes; ask your health care provider or pharmacist if you have questions. COMMON BRAND NAME(S): Zoladex What should I tell my health care provider before I take this medicine? They need to know if you have any of these conditions (some only apply to  women): -diabetes -heart disease or previous heart attack -high blood pressure -high cholesterol -kidney disease -osteoporosis or low bone density -problems passing urine -spinal cord injury -stroke -tobacco smoker -an unusual or allergic reaction to goserelin, hormone therapy, other medicines, foods, dyes, or preservatives -pregnant or trying to get pregnant -breast-feeding How should I use this medicine? This medicine is for injection under the skin. It is given by a health care professional in a hospital or clinic setting. Men receive this injection once every 4 weeks or once every 12 weeks. Women will only receive the once every 4 weeks injection. Talk to your pediatrician regarding the use of this medicine in children. Special care may be needed. Overdosage: If you think you have taken too much of this medicine contact a poison control center or emergency room at once. NOTE: This medicine is only for you. Do not share this medicine with others. What if I miss a dose? It is important not to miss your dose. Call your doctor or health care professional if you are unable to keep an appointment. What may interact with this medicine? -female hormones like estrogen -herbal or dietary supplements like black cohosh, chasteberry, or DHEA -female hormones like testosterone -prasterone This list may not describe all possible interactions. Give your health care provider a list of all the medicines, herbs, non-prescription drugs, or dietary supplements you use. Also tell them if you smoke, drink alcohol, or use illegal drugs. Some items may interact with your medicine. What should I watch for while using this medicine? Visit your doctor or health care professional for regular checks on your progress. Your  symptoms may appear to get worse during the first weeks of this therapy. Tell your doctor or healthcare professional if your symptoms do not start to get better or if they get worse after this  time. Your bones may get weaker if you take this medicine for a long time. If you smoke or frequently drink alcohol you may increase your risk of bone loss. A family history of osteoporosis, chronic use of drugs for seizures (convulsions), or corticosteroids can also increase your risk of bone loss. Talk to your doctor about how to keep your bones strong. This medicine should stop regular monthly menstration in women. Tell your doctor if you continue to [facility] Women should not become pregnant while taking this medicine or for 12 weeks after stopping this medicine. Women should inform their doctor if they wish to become pregnant or think they might be pregnant. There is a potential for serious side effects to an unborn child. Talk to your health care professional or pharmacist for more information. Do not breast-feed an infant while taking this medicine. Men should inform their doctors if they wish to father a child. This medicine may lower sperm counts. Talk to your health care professional or pharmacist for more information. What side effects may I notice from receiving this medicine? Side effects that you should report to your doctor or health care professional as soon as possible: -allergic reactions like skin rash, itching or hives, swelling of the face, lips, or tongue -bone pain -breathing problems -changes in vision -chest pain -feeling faint or lightheaded, falls -fever, chills -pain, swelling, warmth in the leg -pain, tingling, numbness in the hands or feet -signs and symptoms of low blood pressure like dizziness; feeling faint or lightheaded, falls; unusually weak or tired -stomach pain -swelling of the ankles, feet, hands -trouble passing urine or change in the amount of urine -unusually high or low blood pressure -unusually weak or tired Side effects that usually do not require medical attention (report to your doctor or health care professional if they continue or are  bothersome): -change in sex drive or performance -changes in breast size in both males and females -changes in emotions or moods -headache -hot flashes -irritation at site where injected -loss of appetite -skin problems like acne, dry skin -vaginal dryness This list may not describe all possible side effects. Call your doctor for medical advice about side effects. You may report side effects to FDA at 1-800-FDA-1088. Where should I keep my medicine? This drug is given in a hospital or clinic and will not be stored at home. NOTE: This sheet is a summary. It may not cover all possible information. If you have questions about this medicine, talk to your doctor, pharmacist, or health care provider.  2018 Elsevier/Gold Standard (2013-12-24 11:10:35)

## 2018-05-15 ENCOUNTER — Inpatient Hospital Stay: Payer: BLUE CROSS/BLUE SHIELD

## 2018-05-15 VITALS — BP 109/73 | HR 99 | Temp 98.2°F | Resp 17

## 2018-05-15 DIAGNOSIS — C50211 Malignant neoplasm of upper-inner quadrant of right female breast: Secondary | ICD-10-CM

## 2018-05-15 DIAGNOSIS — Z170 Estrogen receptor positive status [ER+]: Principal | ICD-10-CM

## 2018-05-15 DIAGNOSIS — Z95828 Presence of other vascular implants and grafts: Secondary | ICD-10-CM

## 2018-05-15 LAB — CBC WITH DIFFERENTIAL (CANCER CENTER ONLY)
BASOS ABS: 0 10*3/uL (ref 0.0–0.1)
BASOS PCT: 1 %
EOS ABS: 0.1 10*3/uL (ref 0.0–0.5)
EOS PCT: 1 %
HCT: 31.6 % — ABNORMAL LOW (ref 34.8–46.6)
Hemoglobin: 10.4 g/dL — ABNORMAL LOW (ref 11.6–15.9)
LYMPHS PCT: 10 %
Lymphs Abs: 0.4 10*3/uL — ABNORMAL LOW (ref 0.9–3.3)
MCH: 23.4 pg — ABNORMAL LOW (ref 25.1–34.0)
MCHC: 32.9 g/dL (ref 31.5–36.0)
MCV: 71.3 fL — ABNORMAL LOW (ref 79.5–101.0)
Monocytes Absolute: 0.4 10*3/uL (ref 0.1–0.9)
Monocytes Relative: 9 %
Neutro Abs: 3.2 10*3/uL (ref 1.5–6.5)
Neutrophils Relative %: 79 %
PLATELETS: 354 10*3/uL (ref 145–400)
RBC: 4.43 MIL/uL (ref 3.70–5.45)
RDW: 19.6 % — ABNORMAL HIGH (ref 11.2–14.5)
WBC: 4.1 10*3/uL (ref 3.9–10.3)

## 2018-05-15 LAB — CMP (CANCER CENTER ONLY)
ALT: 33 U/L (ref 0–44)
AST: 19 U/L (ref 15–41)
Albumin: 3.9 g/dL (ref 3.5–5.0)
Alkaline Phosphatase: 78 U/L (ref 38–126)
Anion gap: 9 (ref 5–15)
BUN: 10 mg/dL (ref 6–20)
CO2: 28 mmol/L (ref 22–32)
CREATININE: 0.81 mg/dL (ref 0.44–1.00)
Calcium: 9.7 mg/dL (ref 8.9–10.3)
Chloride: 103 mmol/L (ref 98–111)
GFR, Est AFR Am: >60 mL/min (ref >60)
Glucose, Bld: 135 mg/dL — ABNORMAL HIGH (ref 70–99)
POTASSIUM: 3.6 mmol/L (ref 3.5–5.1)
Sodium: 140 mmol/L (ref 135–145)
TOTAL PROTEIN: 7.2 g/dL (ref 6.5–8.1)
Total Bilirubin: 0.2 mg/dL — ABNORMAL LOW (ref 0.3–1.2)

## 2018-05-15 MED ORDER — DIPHENHYDRAMINE HCL 50 MG/ML IJ SOLN
INTRAMUSCULAR | Status: AC
Start: 2018-05-15 — End: ?
  Filled 2018-05-15: qty 1

## 2018-05-15 MED ORDER — DIPHENHYDRAMINE HCL 50 MG/ML IJ SOLN
50.0000 mg | Freq: Once | INTRAMUSCULAR | Status: AC
  Administered 2018-05-15: 50 mg via INTRAVENOUS

## 2018-05-15 MED ORDER — DEXAMETHASONE SODIUM PHOSPHATE 100 MG/10ML IJ SOLN
20.0000 mg | Freq: Once | INTRAMUSCULAR | Status: AC
  Administered 2018-05-15: 20 mg via INTRAVENOUS
  Filled 2018-05-15: qty 2

## 2018-05-15 MED ORDER — SODIUM CHLORIDE 0.9% FLUSH
10.0000 mL | INTRAVENOUS | Status: DC | PRN
  Administered 2018-05-15: 10 mL
  Filled 2018-05-15: qty 10

## 2018-05-15 MED ORDER — HEPARIN SOD (PORK) LOCK FLUSH 100 UNIT/ML IV SOLN
500.0000 [IU] | Freq: Once | INTRAVENOUS | Status: AC | PRN
Start: 2018-05-15 — End: 2018-05-15
  Administered 2018-05-15: 500 [IU]
  Filled 2018-05-15: qty 5

## 2018-05-15 MED ORDER — SODIUM CHLORIDE 0.9 % IV SOLN
80.0000 mg/m2 | Freq: Once | INTRAVENOUS | Status: AC
  Administered 2018-05-15: 126 mg via INTRAVENOUS
  Filled 2018-05-15: qty 21

## 2018-05-15 MED ORDER — SODIUM CHLORIDE 0.9 % IV SOLN
Freq: Once | INTRAVENOUS | Status: AC
Start: 2018-05-15 — End: 2018-05-15
  Administered 2018-05-15: 15:00:00 via INTRAVENOUS

## 2018-05-15 MED ORDER — FAMOTIDINE IN NACL 20-0.9 MG/50ML-% IV SOLN
INTRAVENOUS | Status: AC
  Filled 2018-05-15: qty 50

## 2018-05-15 MED ORDER — SODIUM CHLORIDE 0.9% FLUSH
10.0000 mL | Freq: Once | INTRAVENOUS | Status: AC
  Administered 2018-05-15: 10 mL
  Filled 2018-05-15: qty 10

## 2018-05-15 MED ORDER — FAMOTIDINE IN NACL 20-0.9 MG/50ML-% IV SOLN
20.0000 mg | Freq: Once | INTRAVENOUS | Status: AC
  Administered 2018-05-15: 20 mg via INTRAVENOUS

## 2018-05-15 NOTE — Patient Instructions (Addendum)
[facility] Discharge Instructions for Patients Receiving Chemotherapy  Today you received the following chemotherapy agents:  Taxol (paclitaxel)  To help prevent nausea and vomiting after your treatment, we encourage you to take your nausea medication as prescribed.   If you develop nausea and vomiting that is not controlled by your nausea medication, call the clinic.   BELOW ARE SYMPTOMS THAT SHOULD BE REPORTED IMMEDIATELY:  *FEVER GREATER THAN 100.5 F  *CHILLS WITH OR WITHOUT FEVER  NAUSEA AND VOMITING THAT IS NOT CONTROLLED WITH YOUR NAUSEA MEDICATION  *UNUSUAL SHORTNESS OF BREATH  *UNUSUAL BRUISING OR BLEEDING  TENDERNESS IN MOUTH AND THROAT WITH OR WITHOUT PRESENCE OF ULCERS  *URINARY PROBLEMS  *BOWEL PROBLEMS  UNUSUAL RASH Items with * indicate a potential emergency and should be followed up as soon as possible.  Feel free to call the clinic should you have any questions or concerns. The clinic phone number is [phone].  Please show the CHEMO ALERT CARD at check-in to the Emergency Department and triage nurse.   

## 2018-05-22 ENCOUNTER — Inpatient Hospital Stay: Payer: BLUE CROSS/BLUE SHIELD

## 2018-05-22 ENCOUNTER — Encounter: Payer: Self-pay | Admitting: *Deleted

## 2018-05-22 VITALS — BP 114/74 | HR 97 | Temp 98.6°F | Resp 18

## 2018-05-22 DIAGNOSIS — C50211 Malignant neoplasm of upper-inner quadrant of right female breast: Secondary | ICD-10-CM

## 2018-05-22 DIAGNOSIS — Z170 Estrogen receptor positive status [ER+]: Principal | ICD-10-CM

## 2018-05-22 DIAGNOSIS — Z95828 Presence of other vascular implants and grafts: Secondary | ICD-10-CM

## 2018-05-22 LAB — CBC WITH DIFFERENTIAL (CANCER CENTER ONLY)
BASOS ABS: 0 10*3/uL (ref 0.0–0.1)
Basophils Relative: 0 %
EOS ABS: 0.1 10*3/uL (ref 0.0–0.5)
Eosinophils Relative: 2 %
HCT: 31.1 % — ABNORMAL LOW (ref 34.8–46.6)
Hemoglobin: 10.3 g/dL — ABNORMAL LOW (ref 11.6–15.9)
LYMPHS ABS: 0.5 10*3/uL — AB (ref 0.9–3.3)
Lymphocytes Relative: 14 %
MCH: 23.8 pg — AB (ref 25.1–34.0)
MCHC: 33.1 g/dL (ref 31.5–36.0)
MCV: 71.8 fL — AB (ref 79.5–101.0)
MONO ABS: 0.3 10*3/uL (ref 0.1–0.9)
Monocytes Relative: 10 %
Neutro Abs: 2.4 10*3/uL (ref 1.5–6.5)
Neutrophils Relative %: 74 %
Platelet Count: 330 10*3/uL (ref 145–400)
RBC: 4.33 MIL/uL (ref 3.70–5.45)
RDW: 17.5 % — AB (ref 11.2–14.5)
WBC: 3.2 10*3/uL — AB (ref 3.9–10.3)

## 2018-05-22 LAB — CMP (CANCER CENTER ONLY)
ALBUMIN: 3.8 g/dL (ref 3.5–5.0)
ALT: 33 U/L (ref 0–44)
AST: 18 U/L (ref 15–41)
Alkaline Phosphatase: 69 U/L (ref 38–126)
Anion gap: 9 (ref 5–15)
BUN: 10 mg/dL (ref 6–20)
CO2: 25 mmol/L (ref 22–32)
CREATININE: 0.72 mg/dL (ref 0.44–1.00)
Calcium: 9.4 mg/dL (ref 8.9–10.3)
Chloride: 104 mmol/L (ref 98–111)
GFR, Est AFR Am: >60 mL/min (ref >60)
GLUCOSE: 173 mg/dL — AB (ref 70–99)
Potassium: 3.7 mmol/L (ref 3.5–5.1)
Sodium: 138 mmol/L (ref 135–145)
TOTAL PROTEIN: 7 g/dL (ref 6.5–8.1)

## 2018-05-22 MED ORDER — DIPHENHYDRAMINE HCL 50 MG/ML IJ SOLN
50.0000 mg | Freq: Once | INTRAMUSCULAR | Status: AC
  Administered 2018-05-22: 50 mg via INTRAVENOUS

## 2018-05-22 MED ORDER — FAMOTIDINE IN NACL 20-0.9 MG/50ML-% IV SOLN
INTRAVENOUS | Status: AC
  Filled 2018-05-22: qty 50

## 2018-05-22 MED ORDER — SODIUM CHLORIDE 0.9 % IV SOLN
20.0000 mg | Freq: Once | INTRAVENOUS | Status: AC
  Administered 2018-05-22: 20 mg via INTRAVENOUS
  Filled 2018-05-22: qty 2

## 2018-05-22 MED ORDER — DIPHENHYDRAMINE HCL 50 MG/ML IJ SOLN
INTRAMUSCULAR | Status: AC
  Filled 2018-05-22: qty 1

## 2018-05-22 MED ORDER — HEPARIN SOD (PORK) LOCK FLUSH 100 UNIT/ML IV SOLN
500.0000 [IU] | Freq: Once | INTRAVENOUS | Status: AC | PRN
Start: 2018-05-22 — End: 2018-05-22
  Administered 2018-05-22: 500 [IU]
  Filled 2018-05-22: qty 5

## 2018-05-22 MED ORDER — SODIUM CHLORIDE 0.9 % IV SOLN
80.0000 mg/m2 | Freq: Once | INTRAVENOUS | Status: AC
  Administered 2018-05-22: 126 mg via INTRAVENOUS
  Filled 2018-05-22: qty 21

## 2018-05-22 MED ORDER — SODIUM CHLORIDE 0.9% FLUSH
10.0000 mL | INTRAVENOUS | Status: DC | PRN
  Administered 2018-05-22: 10 mL via INTRAVENOUS
  Filled 2018-05-22: qty 10

## 2018-05-22 MED ORDER — FAMOTIDINE IN NACL 20-0.9 MG/50ML-% IV SOLN
20.0000 mg | Freq: Once | INTRAVENOUS | Status: AC
  Administered 2018-05-22: 20 mg via INTRAVENOUS

## 2018-05-22 MED ORDER — SODIUM CHLORIDE 0.9% FLUSH
10.0000 mL | INTRAVENOUS | Status: DC | PRN
  Administered 2018-05-22: 10 mL
  Filled 2018-05-22: qty 10

## 2018-05-22 MED ORDER — SODIUM CHLORIDE 0.9 % IV SOLN
Freq: Once | INTRAVENOUS | Status: AC
  Administered 2018-05-22: 15:00:00 via INTRAVENOUS

## 2018-05-22 NOTE — Patient Instructions (Signed)
[facility] Discharge Instructions for Patients Receiving Chemotherapy  Today you received the following chemotherapy agents:  Taxol.  To help prevent nausea and vomiting after your treatment, we encourage you to take your nausea medication as directed.   If you develop nausea and vomiting that is not controlled by your nausea medication, call the clinic.   BELOW ARE SYMPTOMS THAT SHOULD BE REPORTED IMMEDIATELY:  *FEVER GREATER THAN 100.5 F  *CHILLS WITH OR WITHOUT FEVER  NAUSEA AND VOMITING THAT IS NOT CONTROLLED WITH YOUR NAUSEA MEDICATION  *UNUSUAL SHORTNESS OF BREATH  *UNUSUAL BRUISING OR BLEEDING  TENDERNESS IN MOUTH AND THROAT WITH OR WITHOUT PRESENCE OF ULCERS  *URINARY PROBLEMS  *BOWEL PROBLEMS  UNUSUAL RASH Items with * indicate a potential emergency and should be followed up as soon as possible.  Feel free to call the clinic should you have any questions or concerns. The clinic phone number is [phone].  Please show the CHEMO ALERT CARD at check-in to the Emergency Department and triage nurse.   

## 2018-05-29 ENCOUNTER — Inpatient Hospital Stay: Payer: BLUE CROSS/BLUE SHIELD

## 2018-05-29 ENCOUNTER — Inpatient Hospital Stay (HOSPITAL_BASED_OUTPATIENT_CLINIC_OR_DEPARTMENT_OTHER): Payer: BLUE CROSS/BLUE SHIELD | Admitting: Hematology and Oncology

## 2018-05-29 ENCOUNTER — Encounter: Payer: Self-pay | Admitting: *Deleted

## 2018-05-29 DIAGNOSIS — C50211 Malignant neoplasm of upper-inner quadrant of right female breast: Secondary | ICD-10-CM

## 2018-05-29 DIAGNOSIS — Z95828 Presence of other vascular implants and grafts: Secondary | ICD-10-CM

## 2018-05-29 DIAGNOSIS — Z170 Estrogen receptor positive status [ER+]: Secondary | ICD-10-CM

## 2018-05-29 DIAGNOSIS — D573 Sickle-cell trait: Secondary | ICD-10-CM

## 2018-05-29 DIAGNOSIS — D509 Iron deficiency anemia, unspecified: Secondary | ICD-10-CM

## 2018-05-29 LAB — CBC WITH DIFFERENTIAL (CANCER CENTER ONLY)
BASOS ABS: 0 10*3/uL (ref 0.0–0.1)
Basophils Relative: 0 %
Eosinophils Absolute: 0 10*3/uL (ref 0.0–0.5)
Eosinophils Relative: 1 %
HEMATOCRIT: 31.5 % — AB (ref 34.8–46.6)
Hemoglobin: 10.4 g/dL — ABNORMAL LOW (ref 11.6–15.9)
Lymphocytes Relative: 14 %
Lymphs Abs: 0.4 10*3/uL — ABNORMAL LOW (ref 0.9–3.3)
MCH: 23.6 pg — ABNORMAL LOW (ref 25.1–34.0)
MCHC: 33 g/dL (ref 31.5–36.0)
MCV: 71.4 fL — AB (ref 79.5–101.0)
MONO ABS: 0.3 10*3/uL (ref 0.1–0.9)
MONOS PCT: 8 %
NEUTROS ABS: 2.4 10*3/uL (ref 1.5–6.5)
Neutrophils Relative %: 77 %
Platelet Count: 313 10*3/uL (ref 145–400)
RBC: 4.41 MIL/uL (ref 3.70–5.45)
RDW: 17.4 % — AB (ref 11.2–14.5)
WBC Count: 3.1 10*3/uL — ABNORMAL LOW (ref 3.9–10.3)

## 2018-05-29 LAB — CMP (CANCER CENTER ONLY)
ALBUMIN: 3.9 g/dL (ref 3.5–5.0)
ALT: 29 U/L (ref 0–44)
ANION GAP: 9 (ref 5–15)
AST: 17 U/L (ref 15–41)
Alkaline Phosphatase: 70 U/L (ref 38–126)
BUN: 12 mg/dL (ref 6–20)
CHLORIDE: 102 mmol/L (ref 98–111)
CO2: 27 mmol/L (ref 22–32)
Calcium: 9.7 mg/dL (ref 8.9–10.3)
Creatinine: 0.82 mg/dL (ref 0.44–1.00)
GFR, Est AFR Am: >60 mL/min (ref >60)
GFR, Estimated: >60 mL/min (ref >60)
GLUCOSE: 209 mg/dL — AB (ref 70–99)
Potassium: 3.4 mmol/L — ABNORMAL LOW (ref 3.5–5.1)
SODIUM: 138 mmol/L (ref 135–145)
TOTAL PROTEIN: 7.2 g/dL (ref 6.5–8.1)
Total Bilirubin: 0.3 mg/dL (ref 0.3–1.2)

## 2018-05-29 MED ORDER — SODIUM CHLORIDE 0.9% FLUSH
10.0000 mL | INTRAVENOUS | Status: DC | PRN
  Administered 2018-05-29: 10 mL
  Filled 2018-05-29: qty 10

## 2018-05-29 MED ORDER — SODIUM CHLORIDE 0.9% FLUSH
10.0000 mL | Freq: Once | INTRAVENOUS | Status: AC
  Administered 2018-05-29: 10 mL
  Filled 2018-05-29: qty 10

## 2018-05-29 MED ORDER — FAMOTIDINE IN NACL 20-0.9 MG/50ML-% IV SOLN
20.0000 mg | Freq: Once | INTRAVENOUS | Status: AC
  Administered 2018-05-29: 20 mg via INTRAVENOUS

## 2018-05-29 MED ORDER — SODIUM CHLORIDE 0.9 % IV SOLN
Freq: Once | INTRAVENOUS | Status: DC
Start: 2018-05-29 — End: 2018-05-29
  Filled 2018-05-29: qty 250

## 2018-05-29 MED ORDER — FAMOTIDINE IN NACL 20-0.9 MG/50ML-% IV SOLN
INTRAVENOUS | Status: AC
  Filled 2018-05-29: qty 50

## 2018-05-29 MED ORDER — DIPHENHYDRAMINE HCL 50 MG/ML IJ SOLN
INTRAMUSCULAR | Status: AC
Start: 2018-05-29 — End: ?
  Filled 2018-05-29: qty 1

## 2018-05-29 MED ORDER — PACLITAXEL CHEMO INJECTION 300 MG/50ML: 80.0000 mg/m2 | Freq: Once | INTRAVENOUS | Status: DC

## 2018-05-29 MED ORDER — HEPARIN SOD (PORK) LOCK FLUSH 100 UNIT/ML IV SOLN
500.0000 [IU] | Freq: Once | INTRAVENOUS | Status: AC | PRN
  Administered 2018-05-29: 500 [IU]
  Filled 2018-05-29: qty 5

## 2018-05-29 MED ORDER — SODIUM CHLORIDE 0.9 % IV SOLN
80.0000 mg/m2 | Freq: Once | INTRAVENOUS | Status: AC
  Administered 2018-05-29: 126 mg via INTRAVENOUS
  Filled 2018-05-29: qty 21

## 2018-05-29 MED ORDER — DIPHENHYDRAMINE HCL 50 MG/ML IJ SOLN
50.0000 mg | Freq: Once | INTRAMUSCULAR | Status: AC
  Administered 2018-05-29: 50 mg via INTRAVENOUS

## 2018-05-29 MED ORDER — SODIUM CHLORIDE 0.9 % IV SOLN
20.0000 mg | Freq: Once | INTRAVENOUS | Status: AC
  Administered 2018-05-29: 20 mg via INTRAVENOUS
  Filled 2018-05-29: qty 2

## 2018-05-29 NOTE — Progress Notes (Signed)
Okay to treat with elevated HR, per Dr. Lindi Adie.

## 2018-05-29 NOTE — Progress Notes (Signed)
Patient Care Team: [facility] as PCP - General Kim Lose, MD as Consulting Physician (Hematology and Oncology) Alphonsa Overall, MD as Consulting Physician (General Surgery) Gery Pray, MD as Consulting Physician (Radiation Oncology)  DIAGNOSIS:  Encounter Diagnosis  Name Primary?  . Malignant neoplasm of upper-inner quadrant of right breast in female, estrogen receptor positive (Kim Griffin)     SUMMARY OF ONCOLOGIC HISTORY:   Malignant neoplasm of upper-inner quadrant of right breast in female, estrogen receptor positive (Kim Griffin)   01/10/2018 Initial Diagnosis    Palpable right breast mass with calcifications medial right breast at 1 o'clock position: 2.6 cm by ultrasound, 1 abnormal lymph node: Ultrasound biopsy of both the mass and the lymph node revealed grade 3 invasive ductal carcinoma ER 90%, PR 2%, Ki-67 70%, HER-2 negative ratio 0.96, T2 N1 stage II a AJCC 8 clinical stage      01/29/2018 -  Neo-Adjuvant Chemotherapy    Dose dense Adriamycin and Cytoxan followed by Taxol x12      02/27/2018 Miscellaneous    Genetics negative       CHIEF COMPLIANT: Cycle 10 Taxol  INTERVAL HISTORY: Kim Griffin is a 22 year old with above-mentioned history of right breast cancer currently on neoadjuvant chemotherapy with dose dense indomethacin and Cytoxan.  Today cycle 10 of Taxol.  She appears to be tolerating chemo extremely well.  She has mild fatigue.  Denies neuropathy.  Denies any nausea vomiting or diarrhea constipation.  REVIEW OF SYSTEMS:   Constitutional: Denies fevers, chills or abnormal weight loss Eyes: Denies blurriness of vision Ears, nose, mouth, throat, and face: Denies mucositis or sore throat Respiratory: Denies cough, dyspnea or wheezes Cardiovascular: Denies palpitation, chest discomfort Gastrointestinal:  Denies nausea, heartburn or change in bowel habits Skin: Denies abnormal skin rashes Lymphatics: Denies new lymphadenopathy or easy  bruising Neurological:Denies numbness, tingling or new weaknesses Behavioral/Psych: Mood is stable, no new changes  Extremities: No lower extremity edema All other systems were reviewed with the patient and are negative.  I have reviewed the past medical history, past surgical history, social history and family history with the patient and they are unchanged from previous note.  ALLERGIES:  has No Known Allergies.  MEDICATIONS:  Current Outpatient Medications  Medication Sig Dispense Refill  . dexamethasone (DECADRON) 4 MG tablet Take 1 tab day after chemo and 1 tab 2 days after chemo with food 10 tablet 0  . ferrous sulfate 325 (65 FE) MG tablet Take 325 mg by mouth daily with breakfast.    . HYDROcodone-acetaminophen (NORCO/VICODIN) 5-325 MG tablet Take 1 tablet by mouth every 6 (six) hours as needed for moderate pain. 20 tablet 0  . ibuprofen (ADVIL,MOTRIN) 800 MG tablet Take 800 mg by mouth every 8 (eight) hours as needed for mild pain or moderate pain.    Marland Kitchen lidocaine-prilocaine (EMLA) cream Apply to affected area once 30 g 3  . LORazepam (ATIVAN) 0.5 MG tablet Take 1 tablet (0.5 mg total) by mouth at bedtime as needed for sleep. 30 tablet 0  . ondansetron (ZOFRAN) 8 MG tablet Take 1 tablet (8 mg total) by mouth 2 (two) times daily as needed. Start on the third day after chemotherapy. 30 tablet 1  . prochlorperazine (COMPAZINE) 10 MG tablet Take 1 tablet (10 mg total) by mouth every 6 (six) hours as needed (Nausea or vomiting). 30 tablet 1   No current facility-administered medications for this visit.     PHYSICAL EXAMINATION: ECOG PERFORMANCE STATUS: 1 - Symptomatic  but completely ambulatory  Vitals:   05/29/18 1131  BP: 120/83  Pulse: (!) 116  Resp: 20  Temp: 98.4 F (36.9 C)  SpO2: 100%   Filed Weights   05/29/18 1131  Weight: 124 lb 6.4 oz (56.4 kg)    GENERAL:alert, no distress and comfortable SKIN: skin color, texture, turgor are normal, no rashes or significant  lesions EYES: normal, Conjunctiva are pink and non-injected, sclera clear OROPHARYNX:no exudate, no erythema and lips, buccal mucosa, and tongue normal  NECK: supple, thyroid normal size, non-tender, without nodularity LYMPH:  no palpable lymphadenopathy in the cervical, axillary or inguinal LUNGS: clear to auscultation and percussion with normal breathing effort HEART: regular rate & rhythm and no murmurs and no lower extremity edema ABDOMEN:abdomen soft, non-tender and normal bowel sounds MUSCULOSKELETAL:no cyanosis of digits and no clubbing  NEURO: alert & oriented x 3 with fluent speech, no focal motor/sensory deficits EXTREMITIES: No lower extremity edema  LABORATORY DATA:  I have reviewed the data as listed CMP Latest Ref Rng & Units 05/22/2018 05/15/2018 05/08/2018  Glucose 70 - 99 mg/dL 173(H) 135(H) 112(H)  BUN 6 - 20 mg/dL _0 Creatinine 0.44 - 1.00 mg/dL 0.72 0.81 0.77  Sodium 135 - 145 mmol/L 138 140 140  Potassium 3.5 - 5.1 mmol/L 3.7 3.6 3.7  Chloride 98 - 111 mmol/L 104 103 102  CO2 22 - 32 mmol/L _1 Calcium 8.9 - 10.3 mg/dL 9.4 9.7 9.8  Total Protein 6.5 - 8.1 g/dL 7.0 7.2 7.1  Total Bilirubin 0.3 - 1.2 mg/dL <0.2(L) 0.2(L) 0.3  Alkaline Phos 38 - 126 U/L 69 78 75  AST 15 - 41 U/L _2 ALT 0 - 44 U/L 33 33 27    Lab Results  Component Value Date   WBC 3.1 (L) 05/29/2018   HGB 10.4 (L) 05/29/2018   HCT 31.5 (L) 05/29/2018   MCV 71.4 (L) 05/29/2018   PLT 313 05/29/2018   NEUTROABS 2.4 05/29/2018    ASSESSMENT & PLAN:  Malignant neoplasm of upper-inner quadrant of right breast in female, estrogen receptor positive (Kim Griffin) 01/10/2018:Palpable right breast mass with calcifications medial right breast at 1 o'clock position: 2.6 cm by ultrasound, 1 abnormal lymph node: Ultrasound biopsy of both the mass and the lymph node revealed grade 3 invasive ductal carcinoma ER 90%, PR 2%, Ki-67 70%, HER-2 negative ratio 0.96, T2 N1 stage II a AJCC 8 clinical  stage  Recommendation: 1 Genetic testing: Negative.Staging scans: No metastatic disease 2.neoadjuvant chemotherapy with dose dense Adriamycin and Cytoxan followed by Taxol weekly x12 3.Followed by surgery depending on genetic test results and patient's preference 4.Followed by adjuvant radiation 5.Followed by antiestrogen therapy and evaluation on Natalee clinical trial ------------------------------------------------------------------------ Current treatment:Completed 4 cycles ofdose dense Adriamycin Cytoxan, today cycle 10 Taxol  Chemo toxicities: Denies any nausea or vomiting. [place]. In fact patient has tolerated chemotherapy extremely well.  Microcytic anemia:Iron studies show 13% saturation with a ferritin of 52 TIBC 308. Hemoglobin electrophoresis showed sickle cell trait heterozygous with hemoglobin S of 30.6%  Return to clinicweekly for Taxol in 2 weeks for her last chemotherapy follow-up with me I would arrange for post neoadjuvant breast MRI, tumor board discussion and surgery consultation  Orders Placed This Encounter  Procedures  . MR BREAST BILATERAL W WO CONTRAST INC CAD    Standing Status:   Future    Standing Expiration Date:   07/31/2019    Order Specific Question:   ** REASON  FOR EXAM (FREE TEXT)    Answer:   post neoad chemo    Order Specific Question:   If indicated for the ordered procedure, I authorize the administration of contrast media per Radiology protocol    Answer:   Yes    Order Specific Question:   What is the patient's sedation requirement?    Answer:   No Sedation    Order Specific Question:   Does the patient have a pacemaker or implanted devices?    Answer:   No    Order Specific Question:   Radiology Contrast Protocol - do NOT remove file path    Answer:   [email or web address]    Order Specific Question:   Preferred imaging location?    Answer:   [facility] (table limit-350 lbs)   The  patient has a good understanding of the overall plan. she agrees with it. she will call with any problems that may develop before the next visit here.   Harriette Ohara, MD 05/29/18

## 2018-05-29 NOTE — Assessment & Plan Note (Signed)
01/10/2018:Palpable right breast mass with calcifications medial right breast at 1 o'clock position: 2.6 cm by ultrasound, 1 abnormal lymph node: Ultrasound biopsy of both the mass and the lymph node revealed grade 3 invasive ductal carcinoma ER 90%, PR 2%, Ki-67 70%, HER-2 negative ratio 0.96, T2 N1 stage II a AJCC 8 clinical stage  Recommendation: 1 Genetic testing: Negative.Staging scans: No metastatic disease 2.neoadjuvant chemotherapy with dose dense Adriamycin and Cytoxan followed by Taxol weekly x12 3.Followed by surgery depending on genetic test results and patient's preference 4.Followed by adjuvant radiation 5.Followed by antiestrogen therapy and evaluation on Natalee clinical trial ------------------------------------------------------------------------ Current treatment:Completed 4 cycles ofdose dense Adriamycin Cytoxan, today cycle 10 Taxol  Chemo toxicities: Denies any nausea or vomiting. [place]. In fact patient has tolerated chemotherapy extremely well.  Microcytic anemia:Iron studies show 13% saturation with a ferritin of 52 TIBC 308. Hemoglobin electrophoresis showed sickle cell trait heterozygous with hemoglobin S of 30.6%  Return to clinicweekly for Taxol in 2 weeks for her last chemotherapy follow-up with me I would arrange for post neoadjuvant breast MRI, tumor board discussion and surgery consultation

## 2018-05-29 NOTE — Patient Instructions (Signed)
[facility] Discharge Instructions for Patients Receiving Chemotherapy  Today you received the following chemotherapy agents:  Taxol.  To help prevent nausea and vomiting after your treatment, we encourage you to take your nausea medication as directed.   If you develop nausea and vomiting that is not controlled by your nausea medication, call the clinic.   BELOW ARE SYMPTOMS THAT SHOULD BE REPORTED IMMEDIATELY:  *FEVER GREATER THAN 100.5 F  *CHILLS WITH OR WITHOUT FEVER  NAUSEA AND VOMITING THAT IS NOT CONTROLLED WITH YOUR NAUSEA MEDICATION  *UNUSUAL SHORTNESS OF BREATH  *UNUSUAL BRUISING OR BLEEDING  TENDERNESS IN MOUTH AND THROAT WITH OR WITHOUT PRESENCE OF ULCERS  *URINARY PROBLEMS  *BOWEL PROBLEMS  UNUSUAL RASH Items with * indicate a potential emergency and should be followed up as soon as possible.  Feel free to call the clinic should you have any questions or concerns. The clinic phone number is [phone].  Please show the CHEMO ALERT CARD at check-in to the Emergency Department and triage nurse.   

## 2018-06-05 ENCOUNTER — Inpatient Hospital Stay: Payer: BLUE CROSS/BLUE SHIELD

## 2018-06-05 ENCOUNTER — Inpatient Hospital Stay: Payer: BLUE CROSS/BLUE SHIELD | Attending: Hematology and Oncology

## 2018-06-05 ENCOUNTER — Encounter: Payer: Self-pay | Admitting: *Deleted

## 2018-06-05 VITALS — BP 113/72 | HR 107 | Temp 98.4°F | Resp 18

## 2018-06-05 DIAGNOSIS — C50211 Malignant neoplasm of upper-inner quadrant of right female breast: Secondary | ICD-10-CM | POA: Diagnosis not present

## 2018-06-05 DIAGNOSIS — Z95828 Presence of other vascular implants and grafts: Secondary | ICD-10-CM

## 2018-06-05 DIAGNOSIS — Z5111 Encounter for antineoplastic chemotherapy: Secondary | ICD-10-CM | POA: Diagnosis present

## 2018-06-05 DIAGNOSIS — Z170 Estrogen receptor positive status [ER+]: Secondary | ICD-10-CM

## 2018-06-05 DIAGNOSIS — Z79899 Other long term (current) drug therapy: Secondary | ICD-10-CM | POA: Insufficient documentation

## 2018-06-05 LAB — CBC WITH DIFFERENTIAL (CANCER CENTER ONLY)
Basophils Absolute: 0 10*3/uL (ref 0.0–0.1)
Basophils Relative: 1 %
EOS ABS: 0 10*3/uL (ref 0.0–0.5)
Eosinophils Relative: 1 %
HCT: 31.3 % — ABNORMAL LOW (ref 34.8–46.6)
Hemoglobin: 10.4 g/dL — ABNORMAL LOW (ref 11.6–15.9)
LYMPHS ABS: 0.6 10*3/uL — AB (ref 0.9–3.3)
LYMPHS PCT: 13 %
MCH: 23.7 pg — AB (ref 25.1–34.0)
MCHC: 33.1 g/dL (ref 31.5–36.0)
MCV: 71.7 fL — AB (ref 79.5–101.0)
MONOS PCT: 11 %
Monocytes Absolute: 0.5 10*3/uL (ref 0.1–0.9)
Neutro Abs: 3.2 10*3/uL (ref 1.5–6.5)
Neutrophils Relative %: 74 %
Platelet Count: 367 10*3/uL (ref 145–400)
RBC: 4.36 MIL/uL (ref 3.70–5.45)
RDW: 18.1 % — ABNORMAL HIGH (ref 11.2–14.5)
WBC Count: 4.3 10*3/uL (ref 3.9–10.3)

## 2018-06-05 LAB — CMP (CANCER CENTER ONLY)
ALBUMIN: 3.9 g/dL (ref 3.5–5.0)
ALT: 32 U/L (ref 0–44)
AST: 20 U/L (ref 15–41)
Alkaline Phosphatase: 70 U/L (ref 38–126)
Anion gap: 10 (ref 5–15)
BUN: 11 mg/dL (ref 6–20)
CHLORIDE: 105 mmol/L (ref 98–111)
CO2: 26 mmol/L (ref 22–32)
CREATININE: 0.68 mg/dL (ref 0.44–1.00)
Calcium: 9.6 mg/dL (ref 8.9–10.3)
GFR, Est AFR Am: >60 mL/min (ref >60)
GFR, Estimated: >60 mL/min (ref >60)
Glucose, Bld: 99 mg/dL (ref 70–99)
POTASSIUM: 3.6 mmol/L (ref 3.5–5.1)
SODIUM: 141 mmol/L (ref 135–145)
Total Bilirubin: 0.2 mg/dL — ABNORMAL LOW (ref 0.3–1.2)
Total Protein: 7.2 g/dL (ref 6.5–8.1)

## 2018-06-05 MED ORDER — DIPHENHYDRAMINE HCL 50 MG/ML IJ SOLN
INTRAMUSCULAR | Status: AC
Start: 2018-06-05 — End: ?
  Filled 2018-06-05: qty 1

## 2018-06-05 MED ORDER — FAMOTIDINE IN NACL 20-0.9 MG/50ML-% IV SOLN
INTRAVENOUS | Status: AC
  Filled 2018-06-05: qty 50

## 2018-06-05 MED ORDER — SODIUM CHLORIDE 0.9 % IV SOLN
Freq: Once | INTRAVENOUS | Status: AC
  Administered 2018-06-05: 15:00:00 via INTRAVENOUS
  Filled 2018-06-05: qty 250

## 2018-06-05 MED ORDER — HEPARIN SOD (PORK) LOCK FLUSH 100 UNIT/ML IV SOLN
500.0000 [IU] | Freq: Once | INTRAVENOUS | Status: AC | PRN
  Administered 2018-06-05: 500 [IU]
  Filled 2018-06-05: qty 5

## 2018-06-05 MED ORDER — GOSERELIN ACETATE 3.6 MG ~~LOC~~ IMPL
DRUG_IMPLANT | SUBCUTANEOUS | Status: AC
  Filled 2018-06-05: qty 3.6

## 2018-06-05 MED ORDER — PACLITAXEL CHEMO INJECTION 300 MG/50ML
80.0000 mg/m2 | Freq: Once | INTRAVENOUS | Status: DC
  Filled 2018-06-05: qty 21

## 2018-06-05 MED ORDER — SODIUM CHLORIDE 0.9 % IV SOLN
80.0000 mg/m2 | Freq: Once | INTRAVENOUS | Status: AC
  Administered 2018-06-05: 126 mg via INTRAVENOUS
  Filled 2018-06-05: qty 21

## 2018-06-05 MED ORDER — DIPHENHYDRAMINE HCL 50 MG/ML IJ SOLN
50.0000 mg | Freq: Once | INTRAMUSCULAR | Status: AC
  Administered 2018-06-05: 50 mg via INTRAVENOUS

## 2018-06-05 MED ORDER — SODIUM CHLORIDE 0.9 % IV SOLN
20.0000 mg | Freq: Once | INTRAVENOUS | Status: AC
  Administered 2018-06-05: 20 mg via INTRAVENOUS
  Filled 2018-06-05: qty 2

## 2018-06-05 MED ORDER — SODIUM CHLORIDE 0.9% FLUSH
10.0000 mL | Freq: Once | INTRAVENOUS | Status: AC
  Administered 2018-06-05: 10 mL
  Filled 2018-06-05: qty 10

## 2018-06-05 MED ORDER — GOSERELIN ACETATE 3.6 MG ~~LOC~~ IMPL
3.6000 mg | DRUG_IMPLANT | Freq: Once | SUBCUTANEOUS | Status: AC
  Administered 2018-06-05: 3.6 mg via SUBCUTANEOUS

## 2018-06-05 MED ORDER — SODIUM CHLORIDE 0.9% FLUSH
10.0000 mL | INTRAVENOUS | Status: DC | PRN
  Administered 2018-06-05: 10 mL
  Filled 2018-06-05: qty 10

## 2018-06-05 MED ORDER — FAMOTIDINE IN NACL 20-0.9 MG/50ML-% IV SOLN
20.0000 mg | Freq: Once | INTRAVENOUS | Status: AC
  Administered 2018-06-05: 20 mg via INTRAVENOUS

## 2018-06-05 NOTE — Progress Notes (Signed)
Ok to treat with HR 107 per MD Lindi Adie

## 2018-06-05 NOTE — Patient Instructions (Signed)
[facility] Discharge Instructions for Patients Receiving Chemotherapy  Today you received the following chemotherapy agents:  Taxol.  To help prevent nausea and vomiting after your treatment, we encourage you to take your nausea medication as directed.   If you develop nausea and vomiting that is not controlled by your nausea medication, call the clinic.   BELOW ARE SYMPTOMS THAT SHOULD BE REPORTED IMMEDIATELY:  *FEVER GREATER THAN 100.5 F  *CHILLS WITH OR WITHOUT FEVER  NAUSEA AND VOMITING THAT IS NOT CONTROLLED WITH YOUR NAUSEA MEDICATION  *UNUSUAL SHORTNESS OF BREATH  *UNUSUAL BRUISING OR BLEEDING  TENDERNESS IN MOUTH AND THROAT WITH OR WITHOUT PRESENCE OF ULCERS  *URINARY PROBLEMS  *BOWEL PROBLEMS  UNUSUAL RASH Items with * indicate a potential emergency and should be followed up as soon as possible.  Feel free to call the clinic should you have any questions or concerns. The clinic phone number is [phone].  Please show the CHEMO ALERT CARD at check-in to the Emergency Department and triage nurse.   

## 2018-06-12 ENCOUNTER — Inpatient Hospital Stay: Payer: BLUE CROSS/BLUE SHIELD

## 2018-06-12 ENCOUNTER — Ambulatory Visit: Payer: BLUE CROSS/BLUE SHIELD

## 2018-06-12 ENCOUNTER — Inpatient Hospital Stay (HOSPITAL_BASED_OUTPATIENT_CLINIC_OR_DEPARTMENT_OTHER): Payer: BLUE CROSS/BLUE SHIELD | Admitting: Hematology and Oncology

## 2018-06-12 DIAGNOSIS — Z95828 Presence of other vascular implants and grafts: Secondary | ICD-10-CM

## 2018-06-12 DIAGNOSIS — R5383 Other fatigue: Secondary | ICD-10-CM | POA: Diagnosis not present

## 2018-06-12 DIAGNOSIS — Z170 Estrogen receptor positive status [ER+]: Secondary | ICD-10-CM | POA: Diagnosis not present

## 2018-06-12 DIAGNOSIS — C50211 Malignant neoplasm of upper-inner quadrant of right female breast: Secondary | ICD-10-CM

## 2018-06-12 DIAGNOSIS — Z5111 Encounter for antineoplastic chemotherapy: Secondary | ICD-10-CM | POA: Diagnosis not present

## 2018-06-12 LAB — CBC WITH DIFFERENTIAL (CANCER CENTER ONLY)
BASOS ABS: 0 10*3/uL (ref 0.0–0.1)
Basophils Relative: 1 %
EOS PCT: 1 %
Eosinophils Absolute: 0 10*3/uL (ref 0.0–0.5)
HCT: 31.7 % — ABNORMAL LOW (ref 34.8–46.6)
Hemoglobin: 10.4 g/dL — ABNORMAL LOW (ref 11.6–15.9)
LYMPHS PCT: 12 %
Lymphs Abs: 0.4 10*3/uL — ABNORMAL LOW (ref 0.9–3.3)
MCH: 23.5 pg — AB (ref 25.1–34.0)
MCHC: 32.7 g/dL (ref 31.5–36.0)
MCV: 71.9 fL — AB (ref 79.5–101.0)
Monocytes Absolute: 0.2 10*3/uL (ref 0.1–0.9)
Monocytes Relative: 6 %
Neutro Abs: 2.8 10*3/uL (ref 1.5–6.5)
Neutrophils Relative %: 80 %
PLATELETS: 337 10*3/uL (ref 145–400)
RBC: 4.41 MIL/uL (ref 3.70–5.45)
RDW: 18.3 % — AB (ref 11.2–14.5)
WBC: 3.5 10*3/uL — AB (ref 3.9–10.3)

## 2018-06-12 LAB — CMP (CANCER CENTER ONLY)
ALK PHOS: 69 U/L (ref 38–126)
ALT: 26 U/L (ref 0–44)
AST: 17 U/L (ref 15–41)
Albumin: 3.7 g/dL (ref 3.5–5.0)
Anion gap: 12 (ref 5–15)
BILIRUBIN TOTAL: 0.3 mg/dL (ref 0.3–1.2)
BUN: 9 mg/dL (ref 6–20)
CO2: 25 mmol/L (ref 22–32)
CREATININE: 0.78 mg/dL (ref 0.44–1.00)
Calcium: 9.3 mg/dL (ref 8.9–10.3)
Chloride: 102 mmol/L (ref 98–111)
GFR, Est AFR Am: >60 mL/min (ref >60)
Glucose, Bld: 204 mg/dL — ABNORMAL HIGH (ref 70–99)
POTASSIUM: 3.6 mmol/L (ref 3.5–5.1)
Sodium: 139 mmol/L (ref 135–145)
TOTAL PROTEIN: 6.9 g/dL (ref 6.5–8.1)

## 2018-06-12 MED ORDER — FAMOTIDINE IN NACL 20-0.9 MG/50ML-% IV SOLN
20.0000 mg | Freq: Once | INTRAVENOUS | Status: AC
  Administered 2018-06-12: 20 mg via INTRAVENOUS

## 2018-06-12 MED ORDER — SODIUM CHLORIDE 0.9 % IV SOLN
20.0000 mg | Freq: Once | INTRAVENOUS | Status: AC
  Administered 2018-06-12: 20 mg via INTRAVENOUS
  Filled 2018-06-12: qty 2

## 2018-06-12 MED ORDER — SODIUM CHLORIDE 0.9% FLUSH
10.0000 mL | Freq: Once | INTRAVENOUS | Status: AC
  Administered 2018-06-12: 10 mL
  Filled 2018-06-12: qty 10

## 2018-06-12 MED ORDER — SODIUM CHLORIDE 0.9% FLUSH
10.0000 mL | INTRAVENOUS | Status: DC | PRN
  Administered 2018-06-12: 10 mL
  Filled 2018-06-12: qty 10

## 2018-06-12 MED ORDER — HEPARIN SOD (PORK) LOCK FLUSH 100 UNIT/ML IV SOLN
500.0000 [IU] | Freq: Once | INTRAVENOUS | Status: AC | PRN
  Administered 2018-06-12: 500 [IU]
  Filled 2018-06-12: qty 5

## 2018-06-12 MED ORDER — SODIUM CHLORIDE 0.9 % IV SOLN
Freq: Once | INTRAVENOUS | Status: AC
  Administered 2018-06-12: 14:00:00 via INTRAVENOUS
  Filled 2018-06-12: qty 250

## 2018-06-12 MED ORDER — SODIUM CHLORIDE 0.9 % IV SOLN
80.0000 mg/m2 | Freq: Once | INTRAVENOUS | Status: AC
  Administered 2018-06-12: 126 mg via INTRAVENOUS
  Filled 2018-06-12: qty 21

## 2018-06-12 MED ORDER — DIPHENHYDRAMINE HCL 50 MG/ML IJ SOLN
50.0000 mg | Freq: Once | INTRAMUSCULAR | Status: AC
  Administered 2018-06-12: 50 mg via INTRAVENOUS

## 2018-06-12 MED ORDER — FAMOTIDINE IN NACL 20-0.9 MG/50ML-% IV SOLN
INTRAVENOUS | Status: AC
  Filled 2018-06-12: qty 50

## 2018-06-12 MED ORDER — DIPHENHYDRAMINE HCL 50 MG/ML IJ SOLN
INTRAMUSCULAR | Status: AC
Start: 2018-06-12 — End: ?
  Filled 2018-06-12: qty 1

## 2018-06-12 NOTE — Progress Notes (Signed)
Patient Care Team: [facility] as PCP - General Nicholas Lose, MD as Consulting Physician (Hematology and Oncology) Alphonsa Overall, MD as Consulting Physician (General Surgery) Gery Pray, MD as Consulting Physician (Radiation Oncology)  DIAGNOSIS:  Encounter Diagnosis  Name Primary?  . Malignant neoplasm of upper-inner quadrant of right breast in female, estrogen receptor positive ([place])     SUMMARY OF ONCOLOGIC HISTORY:   Malignant neoplasm of upper-inner quadrant of right breast in female, estrogen receptor positive ([place])   01/10/2018 Initial Diagnosis    Palpable right breast mass with calcifications medial right breast at 1 o'clock position: 2.6 cm by ultrasound, 1 abnormal lymph node: Ultrasound biopsy of both the mass and the lymph node revealed grade 3 invasive ductal carcinoma ER 90%, PR 2%, Ki-67 70%, HER-2 negative ratio 0.96, T2 N1 stage II a AJCC 8 clinical stage    01/29/2018 -  Neo-Adjuvant Chemotherapy    Dose dense Adriamycin and Cytoxan followed by Taxol x12    02/27/2018 Miscellaneous    Genetics negative     CHIEF COMPLIANT: Cycle 12 Taxol  INTERVAL HISTORY: Kim Griffin is a 22 year old with above-mentioned history of right breast cancer currently neoadjuvant chemotherapy and today's cycle 12 of Taxol.  She has tolerated the chemo extremely well.  Does not have any nausea vomiting.  Denies any fatigue.  Denies neuropathy as well.  REVIEW OF SYSTEMS:   Constitutional: Denies fevers, chills or abnormal weight loss Eyes: Denies blurriness of vision Ears, nose, mouth, throat, and face: Denies mucositis or sore throat Respiratory: Denies cough, dyspnea or wheezes Cardiovascular: Denies palpitation, chest discomfort Gastrointestinal:  Denies nausea, heartburn or change in bowel habits Skin: Denies abnormal skin rashes Lymphatics: Denies new lymphadenopathy or easy bruising Neurological:Denies numbness, tingling or new  weaknesses Behavioral/Psych: Mood is stable, no new changes  Extremities: No lower extremity edema   All other systems were reviewed with the patient and are negative.  I have reviewed the past medical history, past surgical history, social history and family history with the patient and they are unchanged from previous note.  ALLERGIES:  has No Known Allergies.  MEDICATIONS:  Current Outpatient Medications  Medication Sig Dispense Refill  . dexamethasone (DECADRON) 4 MG tablet Take 1 tab day after chemo and 1 tab 2 days after chemo with food 10 tablet 0  . ferrous sulfate 325 (65 FE) MG tablet Take 325 mg by mouth daily with breakfast.    . HYDROcodone-acetaminophen (NORCO/VICODIN) 5-325 MG tablet Take 1 tablet by mouth every 6 (six) hours as needed for moderate pain. 20 tablet 0  . ibuprofen (ADVIL,MOTRIN) 800 MG tablet Take 800 mg by mouth every 8 (eight) hours as needed for mild pain or moderate pain.    Marland Kitchen lidocaine-prilocaine (EMLA) cream Apply to affected area once 30 g 3  . LORazepam (ATIVAN) 0.5 MG tablet Take 1 tablet (0.5 mg total) by mouth at bedtime as needed for sleep. 30 tablet 0  . ondansetron (ZOFRAN) 8 MG tablet Take 1 tablet (8 mg total) by mouth 2 (two) times daily as needed. Start on the third day after chemotherapy. 30 tablet 1  . prochlorperazine (COMPAZINE) 10 MG tablet Take 1 tablet (10 mg total) by mouth every 6 (six) hours as needed (Nausea or vomiting). 30 tablet 1   No current facility-administered medications for this visit.     PHYSICAL EXAMINATION: ECOG PERFORMANCE STATUS: 1 - Symptomatic but completely ambulatory  Vitals:   06/12/18 1340  BP: 124/78  Pulse: (!) 115  Resp: 18  Temp: 98.1 F (36.7 C)  SpO2: 100%   Filed Weights   06/12/18 1340  Weight: 126 lb 3.2 oz (57.2 kg)    GENERAL:alert, no distress and comfortable SKIN: skin color, texture, turgor are normal, no rashes or significant lesions EYES: normal, Conjunctiva are pink and  non-injected, sclera clear OROPHARYNX:no exudate, no erythema and lips, buccal mucosa, and tongue normal  NECK: supple, thyroid normal size, non-tender, without nodularity LYMPH:  no palpable lymphadenopathy in the cervical, axillary or inguinal LUNGS: clear to auscultation and percussion with normal breathing effort HEART: regular rate & rhythm and no murmurs and no lower extremity edema ABDOMEN:abdomen soft, non-tender and normal bowel sounds MUSCULOSKELETAL:no cyanosis of digits and no clubbing  NEURO: alert & oriented x 3 with fluent speech, no focal motor/sensory deficits EXTREMITIES: No lower extremity edema    LABORATORY DATA:  I have reviewed the data as listed CMP Latest Ref Rng & Units 06/05/2018 05/29/2018 05/22/2018  Glucose 70 - 99 mg/dL 99 209(H) 173(H)  BUN 6 - 20 mg/dL 11 12 10   Creatinine 0.44 - 1.00 mg/dL 0.68 0.82 0.72  Sodium 135 - 145 mmol/L 141 138 138  Potassium 3.5 - 5.1 mmol/L 3.6 3.4(L) 3.7  Chloride 98 - 111 mmol/L 105 102 104  CO2 22 - 32 mmol/L 26 27 25   Calcium 8.9 - 10.3 mg/dL 9.6 9.7 9.4  Total Protein 6.5 - 8.1 g/dL 7.2 7.2 7.0  Total Bilirubin 0.3 - 1.2 mg/dL 0.2(L) 0.3 <0.2(L)  Alkaline Phos 38 - 126 U/L 70 70 69  AST 15 - 41 U/L 20 17 18   ALT 0 - 44 U/L 32 29 33    Lab Results  Component Value Date   WBC 3.5 (L) 06/12/2018   HGB 10.4 (L) 06/12/2018   HCT 31.7 (L) 06/12/2018   MCV 71.9 (L) 06/12/2018   PLT 337 06/12/2018   NEUTROABS 2.8 06/12/2018    ASSESSMENT & PLAN:  Malignant neoplasm of upper-inner quadrant of right breast in female, estrogen receptor positive ([place]) 01/10/2018:Palpable right breast mass with calcifications medial right breast at 1 o'clock position: 2.6 cm by ultrasound, 1 abnormal lymph node: Ultrasound biopsy of both the mass and the lymph node revealed grade 3 invasive ductal carcinoma ER 90%, PR 2%, Ki-67 70%, HER-2 negative ratio 0.96, T2 N1 stage II a AJCC 8 clinical stage  Recommendation: 1 Genetic testing:  Negative.Staging scans: No metastatic disease 2.neoadjuvant chemotherapy with dose dense Adriamycin and Cytoxan followed by Taxol weekly x12 3.Followed by surgery depending on genetic test results and patient's preference 4.Followed by adjuvant radiation 5.Followed by antiestrogen therapy and evaluation on Natalee clinical trial ------------------------------------------------------------------------ Current treatment:Completed 4 cycles ofdose dense Adriamycin Cytoxan, today cycle12Taxol  Chemo toxicities: Denies any nausea or vomiting. [place]. In fact patient has tolerated chemotherapy extremely well.  Breast MRI scheduled for 06/14/2018 She will be discussed in tumor board next week. She will also be seeing her Dr. Lucia Gaskins.  Return to clinic 1 week after surgery to discuss the pathology report  No orders of the defined types were placed in this encounter.  The patient has a good understanding of the overall plan. she agrees with it. she will call with any problems that may develop before the next visit here.   Harriette Ohara, MD 06/12/18

## 2018-06-12 NOTE — Assessment & Plan Note (Signed)
01/10/2018:Palpable right breast mass with calcifications medial right breast at 1 o'clock position: 2.6 cm by ultrasound, 1 abnormal lymph node: Ultrasound biopsy of both the mass and the lymph node revealed grade 3 invasive ductal carcinoma ER 90%, PR 2%, Ki-67 70%, HER-2 negative ratio 0.96, T2 N1 stage II a AJCC 8 clinical stage  Recommendation: 1 Genetic testing: Negative.Staging scans: No metastatic disease 2.neoadjuvant chemotherapy with dose dense Adriamycin and Cytoxan followed by Taxol weekly x12 3.Followed by surgery depending on genetic test results and patient's preference 4.Followed by adjuvant radiation 5.Followed by antiestrogen therapy and evaluation on Natalee clinical trial ------------------------------------------------------------------------ Current treatment:Completed 4 cycles ofdose dense Adriamycin Cytoxan, today cycle12Taxol  Chemo toxicities: Denies any nausea or vomiting. [place]. In fact patient has tolerated chemotherapy extremely well.  Breast MRI scheduled for 06/14/2018 She will be discussed in tumor board next week. She will also be seeing her surgeon.

## 2018-06-12 NOTE — Patient Instructions (Signed)
[facility] Discharge Instructions for Patients Receiving Chemotherapy  Today you received the following chemotherapy agents:  Taxol.  To help prevent nausea and vomiting after your treatment, we encourage you to take your nausea medication as directed.   If you develop nausea and vomiting that is not controlled by your nausea medication, call the clinic.   BELOW ARE SYMPTOMS THAT SHOULD BE REPORTED IMMEDIATELY:  *FEVER GREATER THAN 100.5 F  *CHILLS WITH OR WITHOUT FEVER  NAUSEA AND VOMITING THAT IS NOT CONTROLLED WITH YOUR NAUSEA MEDICATION  *UNUSUAL SHORTNESS OF BREATH  *UNUSUAL BRUISING OR BLEEDING  TENDERNESS IN MOUTH AND THROAT WITH OR WITHOUT PRESENCE OF ULCERS  *URINARY PROBLEMS  *BOWEL PROBLEMS  UNUSUAL RASH Items with * indicate a potential emergency and should be followed up as soon as possible.  Feel free to call the clinic should you have any questions or concerns. The clinic phone number is [phone].  Please show the CHEMO ALERT CARD at check-in to the Emergency Department and triage nurse.   

## 2018-06-12 NOTE — Progress Notes (Signed)
Okay to treat with HR 115 per Dr. Lindi Adie.

## 2018-06-14 ENCOUNTER — Ambulatory Visit (HOSPITAL_COMMUNITY)
Admission: RE | Admit: 2018-06-14 | Discharge: 2018-06-14 | Disposition: A | Discharge status: Home / Self Care | Payer: BLUE CROSS/BLUE SHIELD | Source: Ambulatory Visit | Attending: Hematology and Oncology | Admitting: Hematology and Oncology

## 2018-06-14 DIAGNOSIS — Z170 Estrogen receptor positive status [ER+]: Secondary | ICD-10-CM | POA: Insufficient documentation

## 2018-06-14 DIAGNOSIS — C50211 Malignant neoplasm of upper-inner quadrant of right female breast: Secondary | ICD-10-CM | POA: Diagnosis present

## 2018-06-14 MED ORDER — GADOBENATE DIMEGLUMINE 529 MG/ML IV SOLN
15.0000 mL | Freq: Once | INTRAVENOUS | Status: AC | PRN
  Administered 2018-06-14: 12 mL via INTRAVENOUS

## 2018-06-19 NOTE — Progress Notes (Signed)
FMLA for father, Minette Brine, completed and taken downstairs to be picked up from front desk.

## 2018-06-20 ENCOUNTER — Ambulatory Visit: Payer: Self-pay | Admitting: Surgery

## 2018-06-20 DIAGNOSIS — C50911 Malignant neoplasm of unspecified site of right female breast: Secondary | ICD-10-CM

## 2018-06-26 ENCOUNTER — Other Ambulatory Visit: Payer: Self-pay | Admitting: Surgery

## 2018-06-26 ENCOUNTER — Other Ambulatory Visit: Payer: Self-pay | Admitting: *Deleted

## 2018-06-26 ENCOUNTER — Telehealth: Payer: Self-pay | Admitting: Hematology and Oncology

## 2018-06-26 DIAGNOSIS — C50911 Malignant neoplasm of unspecified site of right female breast: Secondary | ICD-10-CM

## 2018-06-26 DIAGNOSIS — Z170 Estrogen receptor positive status [ER+]: Principal | ICD-10-CM

## 2018-06-26 DIAGNOSIS — C50211 Malignant neoplasm of upper-inner quadrant of right female breast: Secondary | ICD-10-CM

## 2018-06-26 NOTE — Telephone Encounter (Signed)
Scheduled appt per 8/27 sch message - pt is aware of appt date and time   

## 2018-06-27 ENCOUNTER — Encounter: Payer: Self-pay | Admitting: Radiation Oncology

## 2018-07-06 ENCOUNTER — Other Ambulatory Visit: Payer: Self-pay

## 2018-07-06 ENCOUNTER — Encounter (HOSPITAL_BASED_OUTPATIENT_CLINIC_OR_DEPARTMENT_OTHER): Payer: Self-pay | Admitting: *Deleted

## 2018-07-09 NOTE — Progress Notes (Signed)
Patient given ensure pre-surgery with instruction to complete by 0400, she voiced understanding. She was also given CHG wash with instructions, voiced understanding.

## 2018-07-12 ENCOUNTER — Ambulatory Visit
Admission: RE | Admit: 2018-07-12 | Discharge: 2018-07-12 | Disposition: A | Discharge status: Home / Self Care | Payer: BLUE CROSS/BLUE SHIELD | Source: Ambulatory Visit | Attending: Surgery | Admitting: Surgery

## 2018-07-12 ENCOUNTER — Other Ambulatory Visit: Payer: Self-pay | Admitting: Surgery

## 2018-07-12 ENCOUNTER — Encounter (HOSPITAL_BASED_OUTPATIENT_CLINIC_OR_DEPARTMENT_OTHER): Payer: Self-pay

## 2018-07-12 DIAGNOSIS — C50911 Malignant neoplasm of unspecified site of right female breast: Secondary | ICD-10-CM

## 2018-07-12 NOTE — H&P (Signed)
[name]  Location: [facility] Surgery Patient #: [id number] DOB: 15-Jan-1996 Single / Language: Kim Griffin / Race: Black or African American Female  History of Present Illness   The patient is a 22 year old female who presents with a complaint of breast cancer.  The PCP is Priscille Loveless PA  She has has been seen Dr. Idolina Primer for GYN  The pateint was seen at the [facility] Oncology is Drs. Lindi Adie and Kinard She is accompanied by her mother Kim Griffin - and father Kim Griffin.  She has completed chemotx by Dr. Lindi Adie. She has done well with the chemotherapy. She cannot feel the breast mass anymore. She is on leave from work for right now. Her follow up MRI on 06/14/2018 showed 1. Marked decrease in size of biopsy proven malignancy in the upper inner right breast with residual nodular enhancement surrounding the biopsy marking clip measuring 1.9 x 0.7 x 0.8 cm. No new sites of disease in the right breast. 2. Resolution of previously seen right axillary and right internal mammary lymphadenopathy. 3. No MRI evidence of malignancy in the left breast. I gave her a copy of the MRI.  I discussed the options for breast cancer treatment with the patient. The patient is at the [facility], which includes medical oncology and radiation oncology. I discussed the surgical options of lumpectomy vs. mastectomy. If mastectomy, there is the possibility of reconstruction. I discussed the options of lymph node biopsy. The treatment plan depends on the pathologic staging of the tumor and the patient's personal wishes. The risks of surgery include, but are not limited to, bleeding, infection, the need for further surgery, and nerve injury. The patient has been given literature on the treatment of breast cancer.  Plan: 1) She has completed chemotx, 2) Surgery - plan right breast lumpectomy (seed localization) and right targeted axillary  dissection (seed), 3) Will check with Dr. Lindi Adie about removing the port, 4) Question second lumpectomy to remove papilloma (seed)  Past Medical History: 1. Right breast cancer Mammograms: [facility] [place] [facility] - 01/10/2018 - irregular mass 2.6 cm at the 1 o'clock position of the right breast Right breast biopsy at 1 o'clock and right axillary node biopsy on 01/10/2018 - ([id number]) - shows IDC, ER - 90%, PR - 2%, Ki67 - 70%, Her2Neu - neg, grade 3, and POSITIVE right axillary lymph node She is genetics negative, but has VUS. Received neo adjuvant chemotx by Dr. Lindi Adie - completed 06/12/2018  2. Right breast - 9 o'clock  Atypical ductal hyperplasia, intraductal papilloma - 02/09/2018 3. Power port - left subclavian - 01/22/2018 - D. [employer]  Social History: Unmarried. Graduated [employer]. She is accompanied by her mother Kim Griffin and father Kim Griffin. Lives with Aunt, Kim Griffin  Works at [employer] - she is on leave right now   Past Surgical History Kim Griffin; 06/19/2018 3:15 PM) Oral Surgery   Diagnostic Studies History Kim Griffin; 06/19/2018 3:15 PM) Colonoscopy  never Mammogram  never Pap Smear  never  Social History Kim Griffin; 06/19/2018 3:15 PM) Alcohol use  Occasional alcohol use. Caffeine use  Coffee. No drug use  Tobacco use  Never smoker.  Family History Kim Griffin; 06/19/2018 3:15 PM) Arthritis  Family Members In General. Bleeding disorder  Sister. Breast Cancer  Family Members In General. Cerebrovascular Accident  Family Members In General. Diabetes Mellitus  Family Members In General, Father. Hypertension  Family Members In General. Seizure disorder  Family Members In General.  Pregnancy /  Birth History Kim Griffin; 06/19/2018 3:15 PM) Age at menarche  43 years. Gravida  0 Para  0 Regular periods   Other Problems Kim Griffin; 06/19/2018 3:15 PM) Other disease,  cancer, significant illness     Review of Systems Kim Griffin; 06/19/2018 3:15 PM) General Not Present- Appetite Loss, Chills, Fatigue, Fever, Night Sweats, Weight Gain and Weight Loss. Skin Not Present- Change in Wart/Mole, Dryness, Hives, Jaundice, New Lesions, Non-Healing Wounds, Rash and Ulcer. HEENT Present- Seasonal Allergies and Wears glasses/contact lenses. Not Present- Earache, Hearing Loss, Hoarseness, Nose Bleed, Oral Ulcers, Ringing in the Ears, Sinus Pain, Sore Throat, Visual Disturbances and Yellow Eyes. Breast Present- Breast Mass and Breast Pain. Not Present- Nipple Discharge and Skin Changes. Cardiovascular Present- Chest Pain and Shortness of Breath. Not Present- Difficulty Breathing Lying Down, Leg Cramps, Palpitations, Rapid Heart Rate and Swelling of Extremities. Gastrointestinal Not Present- Abdominal Pain, Bloating, Bloody Stool, Change in Bowel Habits, Chronic diarrhea, Constipation, Difficulty Swallowing, Excessive gas, Gets full quickly at meals, Hemorrhoids, Indigestion, Nausea, Rectal Pain and Vomiting. Female Genitourinary Not Present- Frequency, Nocturia, Painful Urination, Pelvic Pain and Urgency. Musculoskeletal Present- Muscle Pain. Not Present- Back Pain, Joint Pain, Joint Stiffness, Muscle Weakness and Swelling of Extremities. Neurological Not Present- Decreased Memory, Fainting, Headaches, Numbness, Seizures, Tingling, Tremor, Trouble walking and Weakness. Psychiatric Present- Anxiety. Not Present- Bipolar, Change in Sleep Pattern, Depression, Fearful and Frequent crying. Endocrine Not Present- Cold Intolerance, Excessive Hunger, Hair Changes, Heat Intolerance, Hot flashes and New Diabetes. Hematology Not Present- Blood Thinners, Easy Bruising, Excessive bleeding, Gland problems, HIV and Persistent Infections.  Vitals Kim Griffin; 06/19/2018 3:17 PM) 06/19/2018 3:16 PM Weight: 129.4 lb Height: 60in Height was reported by patient. Body  Surface Area: 1.55 m Body Mass Index: 25.27 kg/m  Temp.: 97.25F(Temporal)  Pulse: 131 (Regular)  BP: 124/68 (Sitting, Left Arm, Standard)   Physical Exam  General: WN young AA F alert and generally healthy appearing. She has no hair. Skin: Inspection and palpation of the skin unremarkable.  Eyes: Conjunctivae white, pupils equal. Face, ears, nose, mouth, and throat: Face - normal. Normal ears and nose. Lips and teeth normal.  Neck: Supple. No mass. Trachea midline. No thyroid mass.  Lymph Nodes: No supraclavicular or cervical adenopathy. No axillary adenopathy.  Lungs: Normal respiratory effort. Clear to auscultation and symmetric breath sounds. Cardiovascular: Regular rate and rythm. Normal auscultation of the heart. No murmur or rub.  Breast: Right - I can find no further mass in the right breast. Left - No mass. Somewhat dense breast. Port in the [place] of left breast.  Abdomen: Soft. No mass. Liver and spleen not palpable. No tenderness. No abdominal scars.  Musculoskeletal/extremities: Normal gait. Good strength and ROM in upper and lower extremities.   Neurologic: Grossly intact to motor and sensory function.   Psychiatric: Has normal mood and affect. Judgement and insight appear normal.    Assessment & Plan  1.  BREAST CANCER, STAGE 2, RIGHT (C50.911)  Story: Biopsy: Right breast biopsy on 01/10/2018 - ([id number]) - shows IDC, ER - 90%, PR - 2%, Ki67 - 70%, Her2Neu - neg, right axillary lymph node - POSITIVE  Oncology - Gud[place] and Kinard  Plan:   1) She has completed chemotx   2) Surgery - plan right breast lumpectomy (seed localization) and right targeted axillary dissection (seed)   3) Will check with Dr. Lindi Adie about removing the port   4) Question second lumpectomy to remove papilloma (seed)  2.  INTRADUCTAL PAPILLOMA OF BREAST, RIGHT (D24.1)  at 9 o'clock  Impression: To remove at the time of lumpectomy  for right breast cancer  Addendum Note(Jaimen Melone H. Iban Utz MD; 06/20/2018 8:36 AM) Presented at breast cancer conference - 06/20/2018 She will need the second area of the right breast excised (seed) (so a total of 3 seeds) and we may remove the power port  Addendum Note(Chareese Sergent H. Lucia Gaskins MD; 07/12/2018 2:18 PM) I got a call from Dr. Kathie Dike. The medial clip in the right breast is about 1 cm "medial". I think that the clip is close enough for me to use.   Alphonsa Overall, MD, [facility] Surgery Pager: [phone] Office phone:  [phone]

## 2018-07-13 ENCOUNTER — Ambulatory Visit
Admission: RE | Admit: 2018-07-13 | Discharge: 2018-07-13 | Disposition: A | Discharge status: Home / Self Care | Payer: BLUE CROSS/BLUE SHIELD | Source: Ambulatory Visit | Attending: Surgery | Admitting: Surgery

## 2018-07-13 ENCOUNTER — Encounter (HOSPITAL_COMMUNITY)
Admission: RE | Admit: 2018-07-13 | Discharge: 2018-07-13 | Disposition: A | Discharge status: Home / Self Care | Payer: BLUE CROSS/BLUE SHIELD | Source: Ambulatory Visit | Attending: Surgery | Admitting: Surgery

## 2018-07-13 ENCOUNTER — Encounter (HOSPITAL_BASED_OUTPATIENT_CLINIC_OR_DEPARTMENT_OTHER)
Admission: RE | Disposition: A | Discharge status: Home / Self Care | Payer: Self-pay | Source: Ambulatory Visit | Attending: Surgery

## 2018-07-13 ENCOUNTER — Ambulatory Visit (HOSPITAL_BASED_OUTPATIENT_CLINIC_OR_DEPARTMENT_OTHER)
Admission: RE | Admit: 2018-07-13 | Discharge: 2018-07-13 | Disposition: A | Discharge status: Home / Self Care | Payer: BLUE CROSS/BLUE SHIELD | Source: Ambulatory Visit | Attending: Surgery | Admitting: Surgery

## 2018-07-13 ENCOUNTER — Ambulatory Visit (HOSPITAL_BASED_OUTPATIENT_CLINIC_OR_DEPARTMENT_OTHER): Payer: BLUE CROSS/BLUE SHIELD | Admitting: Certified Registered"

## 2018-07-13 ENCOUNTER — Other Ambulatory Visit: Payer: Self-pay

## 2018-07-13 ENCOUNTER — Encounter (HOSPITAL_BASED_OUTPATIENT_CLINIC_OR_DEPARTMENT_OTHER): Payer: Self-pay | Admitting: Certified Registered"

## 2018-07-13 DIAGNOSIS — I10 Essential (primary) hypertension: Secondary | ICD-10-CM | POA: Insufficient documentation

## 2018-07-13 DIAGNOSIS — N644 Mastodynia: Secondary | ICD-10-CM | POA: Diagnosis not present

## 2018-07-13 DIAGNOSIS — E119 Type 2 diabetes mellitus without complications: Secondary | ICD-10-CM | POA: Insufficient documentation

## 2018-07-13 DIAGNOSIS — D241 Benign neoplasm of right breast: Secondary | ICD-10-CM | POA: Diagnosis present

## 2018-07-13 DIAGNOSIS — C50911 Malignant neoplasm of unspecified site of right female breast: Secondary | ICD-10-CM

## 2018-07-13 DIAGNOSIS — G40909 Epilepsy, unspecified, not intractable, without status epilepticus: Secondary | ICD-10-CM | POA: Insufficient documentation

## 2018-07-13 DIAGNOSIS — Z853 Personal history of malignant neoplasm of breast: Secondary | ICD-10-CM | POA: Diagnosis not present

## 2018-07-13 DIAGNOSIS — Z452 Encounter for adjustment and management of vascular access device: Secondary | ICD-10-CM | POA: Diagnosis not present

## 2018-07-13 DIAGNOSIS — Z79899 Other long term (current) drug therapy: Secondary | ICD-10-CM | POA: Insufficient documentation

## 2018-07-13 HISTORY — DX: Personal history of antineoplastic chemotherapy: Z92.21

## 2018-07-13 HISTORY — DX: Malignant neoplasm of unspecified site of right female breast: C50.911

## 2018-07-13 HISTORY — DX: Sickle-cell trait: D57.3

## 2018-07-13 HISTORY — DX: Anemia, unspecified: D64.9

## 2018-07-13 HISTORY — PX: BREAST LUMPECTOMY WITH RADIOACTIVE SEED AND SENTINEL LYMPH NODE BIOPSY: SHX6550

## 2018-07-13 HISTORY — PX: PORT-A-CATH REMOVAL: SHX5289

## 2018-07-13 LAB — POCT PREGNANCY, URINE: Preg Test, Ur: NEGATIVE

## 2018-07-13 SURGERY — BREAST LUMPECTOMY WITH RADIOACTIVE SEED AND SENTINEL LYMPH NODE BIOPSY
Anesthesia: Regional | Site: Breast | Laterality: Right

## 2018-07-13 MED ORDER — FENTANYL CITRATE (PF) 100 MCG/2ML IJ SOLN: 25.0000 ug | INTRAMUSCULAR | Status: DC | PRN

## 2018-07-13 MED ORDER — PROPOFOL 500 MG/50ML IV EMUL
INTRAVENOUS | Status: DC | PRN
  Administered 2018-07-13: 75 ug/kg/min via INTRAVENOUS

## 2018-07-13 MED ORDER — MIDAZOLAM HCL 2 MG/2ML IJ SOLN
INTRAMUSCULAR | Status: AC
  Filled 2018-07-13: qty 2

## 2018-07-13 MED ORDER — FENTANYL CITRATE (PF) 100 MCG/2ML IJ SOLN
INTRAMUSCULAR | Status: AC
  Filled 2018-07-13: qty 2

## 2018-07-13 MED ORDER — OXYCODONE HCL 5 MG/5ML PO SOLN: 5.0000 mg | Freq: Once | ORAL | Status: DC | PRN

## 2018-07-13 MED ORDER — TECHNETIUM TC 99M SULFUR COLLOID FILTERED
1.0000 | Freq: Once | INTRAVENOUS | Status: AC | PRN
  Administered 2018-07-13: 1 via INTRADERMAL

## 2018-07-13 MED ORDER — EPHEDRINE 5 MG/ML INJ
INTRAVENOUS | Status: AC
  Filled 2018-07-13: qty 10

## 2018-07-13 MED ORDER — ACETAMINOPHEN 500 MG PO TABS
1000.0000 mg | ORAL_TABLET | ORAL | Status: AC
  Administered 2018-07-13: 1000 mg via ORAL

## 2018-07-13 MED ORDER — BUPIVACAINE-EPINEPHRINE (PF) 0.25% -1:200000 IJ SOLN
INTRAMUSCULAR | Status: DC | PRN
  Administered 2018-07-13: 15 mL
  Administered 2018-07-13: 5 mL

## 2018-07-13 MED ORDER — PHENYLEPHRINE HCL 10 MG/ML IJ SOLN
INTRAMUSCULAR | Status: AC
  Filled 2018-07-13: qty 2

## 2018-07-13 MED ORDER — LACTATED RINGERS IV SOLN
INTRAVENOUS | Status: DC
  Administered 2018-07-13: 07:00:00 via INTRAVENOUS

## 2018-07-13 MED ORDER — LIDOCAINE HCL (CARDIAC) PF 100 MG/5ML IV SOSY
PREFILLED_SYRINGE | INTRAVENOUS | Status: DC | PRN
  Administered 2018-07-13: 30 mg via INTRAVENOUS

## 2018-07-13 MED ORDER — GABAPENTIN 300 MG PO CAPS
300.0000 mg | ORAL_CAPSULE | ORAL | Status: AC
  Administered 2018-07-13: 300 mg via ORAL

## 2018-07-13 MED ORDER — DEXAMETHASONE SODIUM PHOSPHATE 10 MG/ML IJ SOLN
INTRAMUSCULAR | Status: AC
  Filled 2018-07-13: qty 2

## 2018-07-13 MED ORDER — PROPOFOL 10 MG/ML IV BOLUS
INTRAVENOUS | Status: DC | PRN
  Administered 2018-07-13: 130 mg via INTRAVENOUS

## 2018-07-13 MED ORDER — PHENYLEPHRINE HCL 10 MG/ML IJ SOLN
INTRAMUSCULAR | Status: DC | PRN
  Administered 2018-07-13: 40 ug via INTRAVENOUS

## 2018-07-13 MED ORDER — ACETAMINOPHEN 500 MG PO TABS
ORAL_TABLET | ORAL | Status: AC
  Filled 2018-07-13: qty 2

## 2018-07-13 MED ORDER — DEXAMETHASONE SODIUM PHOSPHATE 4 MG/ML IJ SOLN
INTRAMUSCULAR | Status: DC | PRN
  Administered 2018-07-13: 10 mg via INTRAVENOUS

## 2018-07-13 MED ORDER — BUPIVACAINE-EPINEPHRINE (PF) 0.5% -1:200000 IJ SOLN
INTRAMUSCULAR | Status: AC
  Filled 2018-07-13: qty 60

## 2018-07-13 MED ORDER — LIDOCAINE 2% (20 MG/ML) 5 ML SYRINGE
INTRAMUSCULAR | Status: AC
  Filled 2018-07-13: qty 15

## 2018-07-13 MED ORDER — BUPIVACAINE-EPINEPHRINE (PF) 0.5% -1:200000 IJ SOLN
INTRAMUSCULAR | Status: DC | PRN
  Administered 2018-07-13: 25 mL via PERINEURAL

## 2018-07-13 MED ORDER — CEFAZOLIN SODIUM-DEXTROSE 2-4 GM/100ML-% IV SOLN
2.0000 g | INTRAVENOUS | Status: AC
  Administered 2018-07-13: 2 g via INTRAVENOUS

## 2018-07-13 MED ORDER — MIDAZOLAM HCL 2 MG/2ML IJ SOLN
1.0000 mg | INTRAMUSCULAR | Status: DC | PRN
  Administered 2018-07-13: 2 mg via INTRAVENOUS

## 2018-07-13 MED ORDER — METHYLENE BLUE 0.5 % INJ SOLN
INTRAVENOUS | Status: AC
  Filled 2018-07-13: qty 10

## 2018-07-13 MED ORDER — CEFAZOLIN SODIUM-DEXTROSE 2-4 GM/100ML-% IV SOLN
INTRAVENOUS | Status: AC
  Filled 2018-07-13: qty 100

## 2018-07-13 MED ORDER — CHLORHEXIDINE GLUCONATE CLOTH 2 % EX PADS: 6.0000 | MEDICATED_PAD | Freq: Once | CUTANEOUS | Status: DC

## 2018-07-13 MED ORDER — OXYCODONE HCL 5 MG PO TABS: 5.0000 mg | ORAL_TABLET | Freq: Once | ORAL | Status: DC | PRN

## 2018-07-13 MED ORDER — ONDANSETRON HCL 4 MG/2ML IJ SOLN
INTRAMUSCULAR | Status: DC | PRN
  Administered 2018-07-13: 4 mg via INTRAVENOUS

## 2018-07-13 MED ORDER — PHENYLEPHRINE 40 MCG/ML (10ML) SYRINGE FOR IV PUSH (FOR BLOOD PRESSURE SUPPORT)
PREFILLED_SYRINGE | INTRAVENOUS | Status: AC
  Filled 2018-07-13: qty 10

## 2018-07-13 MED ORDER — SODIUM CHLORIDE 0.9 % IJ SOLN
INTRAMUSCULAR | Status: AC
  Filled 2018-07-13: qty 10

## 2018-07-13 MED ORDER — SCOPOLAMINE 1 MG/3DAYS TD PT72: 1.0000 | MEDICATED_PATCH | Freq: Once | TRANSDERMAL | Status: DC | PRN

## 2018-07-13 MED ORDER — ONDANSETRON HCL 4 MG/2ML IJ SOLN
INTRAMUSCULAR | Status: AC
  Filled 2018-07-13: qty 12

## 2018-07-13 MED ORDER — FENTANYL CITRATE (PF) 100 MCG/2ML IJ SOLN
50.0000 ug | INTRAMUSCULAR | Status: DC | PRN
  Administered 2018-07-13 (×2): 50 ug via INTRAVENOUS

## 2018-07-13 MED ORDER — HYDROCODONE-ACETAMINOPHEN 5-325 MG PO TABS: 1.0000 | ORAL_TABLET | Freq: Four times a day (QID) | ORAL | 0 refills | Status: DC | PRN

## 2018-07-13 MED ORDER — SODIUM CHLORIDE 0.9 % IJ SOLN
INTRAVENOUS | Status: DC | PRN
  Administered 2018-07-13: 3 mL via INTRADERMAL

## 2018-07-13 MED ORDER — GABAPENTIN 300 MG PO CAPS
ORAL_CAPSULE | ORAL | Status: AC
  Filled 2018-07-13: qty 1

## 2018-07-13 SURGICAL SUPPLY — 60 items
ADH SKN CLS APL DERMABOND .7 (GAUZE/BANDAGES/DRESSINGS) ×1
APL SKNCLS STERI-STRIP NONHPOA (GAUZE/BANDAGES/DRESSINGS)
BENZOIN TINCTURE PRP APPL 2/3 (GAUZE/BANDAGES/DRESSINGS) IMPLANT
BINDER BREAST LRG (GAUZE/BANDAGES/DRESSINGS) ×4 IMPLANT
BINDER BREAST MEDIUM (GAUZE/BANDAGES/DRESSINGS) IMPLANT
BINDER BREAST XLRG (GAUZE/BANDAGES/DRESSINGS) IMPLANT
BINDER BREAST XXLRG (GAUZE/BANDAGES/DRESSINGS) IMPLANT
BLADE SURG 15 STRL LF DISP TIS (BLADE) ×4 IMPLANT
BLADE SURG 15 STRL SS (BLADE) ×8
CANISTER SUC SOCK COL 7IN (MISCELLANEOUS) IMPLANT
CANISTER SUCT 1200ML W/VALVE (MISCELLANEOUS) ×4 IMPLANT
CHLORAPREP W/TINT 26ML (MISCELLANEOUS) ×8 IMPLANT
CLIP VESOCCLUDE SM WIDE 6/CT (CLIP) ×4 IMPLANT
CLOSURE WOUND 1/2 X4 (GAUZE/BANDAGES/DRESSINGS)
CLOSURE WOUND 1/4X4 (GAUZE/BANDAGES/DRESSINGS)
COVER BACK TABLE 60X90IN (DRAPES) ×4 IMPLANT
COVER MAYO STAND STRL (DRAPES) ×4 IMPLANT
COVER PROBE W GEL 5X96 (DRAPES) ×4 IMPLANT
DECANTER SPIKE VIAL GLASS SM (MISCELLANEOUS) ×4 IMPLANT
DERMABOND ADVANCED (GAUZE/BANDAGES/DRESSINGS) ×2
DERMABOND ADVANCED .7 DNX12 (GAUZE/BANDAGES/DRESSINGS) ×2 IMPLANT
DEVICE DUBIN W/COMP PLATE 8390 (MISCELLANEOUS) ×8 IMPLANT
DRAPE LAPAROSCOPIC ABDOMINAL (DRAPES) ×4 IMPLANT
DRAPE LAPAROTOMY 100X72 PEDS (DRAPES) ×4 IMPLANT
DRAPE UTILITY XL STRL (DRAPES) ×4 IMPLANT
DRSG PAD ABDOMINAL 8X10 ST (GAUZE/BANDAGES/DRESSINGS) IMPLANT
ELECT COATED BLADE 2.86 ST (ELECTRODE) ×4 IMPLANT
ELECT REM PT RETURN 9FT ADLT (ELECTROSURGICAL) ×4
ELECTRODE REM PT RTRN 9FT ADLT (ELECTROSURGICAL) ×2 IMPLANT
GAUZE SPONGE 4X4 12PLY STRL (GAUZE/BANDAGES/DRESSINGS) ×4 IMPLANT
GAUZE SPONGE 4X4 12PLY STRL LF (GAUZE/BANDAGES/DRESSINGS) IMPLANT
GLOVE BIOGEL PI IND STRL 7.0 (GLOVE) ×4 IMPLANT
GLOVE BIOGEL PI INDICATOR 7.0 (GLOVE) ×4
GLOVE SKINSENSE NS SZ7.5 (GLOVE) ×4
GLOVE SKINSENSE STRL SZ7.5 (GLOVE) ×4 IMPLANT
GLOVE SURG SIGNA 7.5 PF LTX (GLOVE) ×12 IMPLANT
GOWN STRL REUS W/ TWL LRG LVL3 (GOWN DISPOSABLE) IMPLANT
GOWN STRL REUS W/ TWL XL LVL3 (GOWN DISPOSABLE) ×4 IMPLANT
GOWN STRL REUS W/TWL LRG LVL3 (GOWN DISPOSABLE)
GOWN STRL REUS W/TWL XL LVL3 (GOWN DISPOSABLE) ×6
KIT MARKER MARGIN INK (KITS) ×4 IMPLANT
NDL SAFETY ECLIPSE 18X1.5 (NEEDLE) ×2 IMPLANT
NEEDLE HYPO 18GX1.5 SHARP (NEEDLE) ×3
NEEDLE HYPO 25X1 1.5 SAFETY (NEEDLE) ×8 IMPLANT
NS IRRIG 1000ML POUR BTL (IV SOLUTION) ×4 IMPLANT
PACK BASIN DAY SURGERY FS (CUSTOM PROCEDURE TRAY) ×4 IMPLANT
PENCIL BUTTON HOLSTER BLD 10FT (ELECTRODE) ×4 IMPLANT
SHEET MEDIUM DRAPE 40X70 STRL (DRAPES) ×4 IMPLANT
SLEEVE SCD COMPRESS KNEE MED (MISCELLANEOUS) ×4 IMPLANT
SPONGE LAP 18X18 RF (DISPOSABLE) ×4 IMPLANT
STRIP CLOSURE SKIN 1/2X4 (GAUZE/BANDAGES/DRESSINGS) IMPLANT
STRIP CLOSURE SKIN 1/4X4 (GAUZE/BANDAGES/DRESSINGS) IMPLANT
SUT MNCRL AB 4-0 PS2 18 (SUTURE) ×8 IMPLANT
SUT VICRYL 3-0 CR8 SH (SUTURE) ×8 IMPLANT
SYR CONTROL 10ML LL (SYRINGE) ×8 IMPLANT
TOWEL GREEN STERILE FF (TOWEL DISPOSABLE) ×4 IMPLANT
TOWEL OR NON WOVEN STRL DISP B (DISPOSABLE) ×4 IMPLANT
TUBE CONNECTING 20'X1/4 (TUBING) ×1
TUBE CONNECTING 20X1/4 (TUBING) ×3 IMPLANT
YANKAUER SUCT BULB TIP NO VENT (SUCTIONS) ×4 IMPLANT

## 2018-07-13 NOTE — Discharge Instructions (Signed)
No Tylenol until 12:45pm!   [facility] - DISCHARGE INSTRUCTIONS TO PATIENT  Activity:  Driving - May drive in 3 to 5 days, if doing well and off pain meds   Lifting - No lifting more than 15 pounds for 7 days, then no limit  Wound Care:   Leave bandage on for 2 days.  Then remove and shower.  Diet:  As tolerated.  Follow up appointment:  Call Dr. Pollie Friar office [facility] Surgery) at [phone] for an appointment in 2 - 3 weeks.  Medications and dosages:  Resume your home medications.  You have a prescription for:  Vicodin  Call Dr. Lucia Gaskins or his office  [phone]) if you have:  Temperature greater than 100.4,  Persistent nausea and vomiting,  Severe uncontrolled pain,  Redness, tenderness, or signs of infection (pain, swelling, redness, odor or green/yellow discharge around the site),  Difficulty breathing, headache or visual disturbances,  Any other questions or concerns you may have after discharge.  In an emergency, call 911 or go to an Emergency Department at a nearby hospital.    Post Anesthesia Home Care Instructions  Activity: Get plenty of rest for the remainder of the day. A responsible individual must stay with you for 24 hours following the procedure.  For the next 24 hours, DO NOT: -Drive a car -Paediatric nurse -Drink alcoholic beverages -Take any medication unless instructed by your physician -Make any legal decisions or sign important papers.  Meals: Start with liquid foods such as gelatin or soup. Progress to regular foods as tolerated. Avoid greasy, spicy, heavy foods. If nausea and/or vomiting occur, drink only clear liquids until the nausea and/or vomiting subsides. Call your physician if vomiting continues.  Special Instructions/Symptoms: Your throat may feel dry or sore from the anesthesia or the breathing tube placed in your throat during surgery. If this causes discomfort, gargle with warm salt water. The  discomfort should disappear within 24 hours.  If you had a scopolamine patch placed behind your ear for the management of post- operative nausea and/or vomiting:  1. The medication in the patch is effective for 72 hours, after which it should be removed.  Wrap patch in a tissue and discard in the trash. Wash hands thoroughly with soap and water. 2. You may remove the patch earlier than 72 hours if you experience unpleasant side effects which may include dry mouth, dizziness or visual disturbances. 3. Avoid touching the patch. Wash your hands with soap and water after contact with the patch.

## 2018-07-13 NOTE — Anesthesia Procedure Notes (Signed)
Anesthesia Regional Block: Pectoralis block   Pre-Anesthetic Checklist: ,, timeout performed, Correct Patient, Correct Site, Correct Laterality, Correct Procedure, Correct Position, site marked, Risks and benefits discussed,  Surgical consent,  Pre-op evaluation,  At surgeon's request and post-op pain management  Laterality: Right  Prep: chloraprep       Needles:  Injection technique: Single-shot  Needle Type: Echogenic Needle          Additional Needles:   Procedures:,,,, ultrasound used (permanent image in chart),,,,  Narrative:  Start time: 07/13/2018 7:22 AM End time: 07/13/2018 7:26 AM Injection made incrementally with aspirations every 5 mL.  Performed by: Personally   Additional Notes: H+P and labs reviewed, risks and benefits discussed with patient, procedure tolerated well without complications

## 2018-07-13 NOTE — Transfer of Care (Signed)
Immediate Anesthesia Transfer of Care Note  Patient: Kim Griffin  Procedure(s) Performed: RIGHT BREAST LUMPECTOMY X 2 WITH RADIOACTIVE SEED X 2, RIGHT SEED TARGETED AXILLARY LYMPH NODE EXCISION AND RIGHT SENTINEL LYMPH NODE BIOPSY (Right Breast) REMOVAL PORT-A-CATH (Right )  Patient Location: PACU  Anesthesia Type:GA combined with regional for post-op pain  Level of Consciousness: sedated and patient cooperative  Airway & Oxygen Therapy: Patient Spontanous Breathing and Patient connected to face mask oxygen  Post-op Assessment: Report given to RN and Post -op Vital signs reviewed and stable  Post vital signs: Reviewed and stable  Last Vitals:  Vitals Value Taken Time  BP 112/69 07/13/2018 10:05 AM  Temp    Pulse 87 07/13/2018 10:06 AM  Resp 19 07/13/2018 10:06 AM  SpO2 100 % 07/13/2018 10:06 AM  Vitals shown include unvalidated device data.  Last Pain:  Vitals:   07/13/18 0628  TempSrc: Oral  PainSc: 0-No pain         Complications: No apparent anesthesia complications

## 2018-07-13 NOTE — Anesthesia Preprocedure Evaluation (Signed)
Anesthesia Evaluation  Patient identified by MRN, date of birth, ID band Patient awake    Reviewed: Allergy & Precautions, NPO status   Airway Mallampati: I  TM Distance: >3 FB Neck ROM: Full    Dental no notable dental hx. (+) Teeth Intact   Pulmonary neg pulmonary ROS,    breath sounds clear to auscultation       Cardiovascular negative cardio ROS   Rhythm:Regular Rate:Normal     Neuro/Psych negative neurological ROS     GI/Hepatic negative GI ROS, Neg liver ROS,   Endo/Other  negative endocrine ROS  Renal/GU negative Renal ROS     Musculoskeletal negative musculoskeletal ROS (+)   Abdominal Normal abdominal exam  (+)   Peds  Hematology negative hematology ROS (+)   Anesthesia Other Findings   Reproductive/Obstetrics                             Anesthesia Physical Anesthesia Plan  ASA: II  Anesthesia Plan: General and Regional   Post-op Pain Management:  Regional for Post-op pain   Induction: Intravenous  PONV Risk Score and Plan: 3 and Ondansetron, Propofol infusion and Dexamethasone  Airway Management Planned: LMA  Additional Equipment: None  Intra-op Plan:   Post-operative Plan: Extubation in OR  Informed Consent: I have reviewed the patients History and Physical, chart, labs and discussed the procedure including the risks, benefits and alternatives for the proposed anesthesia with the patient or authorized representative who has indicated his/her understanding and acceptance.   Dental advisory given  Plan Discussed with: CRNA and Surgeon  Anesthesia Plan Comments:         Anesthesia Quick Evaluation

## 2018-07-13 NOTE — Op Note (Signed)
07/13/2018  10:04 AM  PATIENT:  Kim Griffin DOB: January 22, 1996 MRN: [id number]  PREOP DIAGNOSIS:   RIGHT BREAST CANCER at 2 o'clock  Right breast papilloma with ADH, 8 o'clock  POSTOP DIAGNOSIS:    Right breast cancer, 2 o'clock position (T1, N1)  Right breast papilloma with ADH, 8 o'clock  PROCEDURE:   Procedure(s): RIGHT BREAST LUMPECTOMY X 2 WITH RADIOACTIVE SEED X 2,   RIGHT SEED TARGETED AXILLARY LYMPH NODE EXCISION AND RIGHT SENTINEL LYMPH NODE BIOPSY  REMOVAL PORT-A-CATH,   Injection of peri areolar area of breast with methylene blue (1.0 cc), deep sentinel lymph node biopsy  SURGEON:   Alphonsa Overall, M.D.  ANESTHESIA:   general  Anesthesiologist: Oleta Mouse, MD CRNA: Blocker, Ernesta Amble, CRNA; Maryella Shivers, CRNA  General  EBL:  minimal  ml  DRAINS:  none   LOCAL MEDICATIONS USED:   20 cc of 1/4% marcaine, right pectoral block by anesthesia.  SPECIMEN:   Right breast specimen - 2 o'clock (6 color paint), right breast specimen - 8 o'clock (6 color paint kit), right axillary targeted node with seed, counts 300, background 15, blue  [Note:  The medial edge of the 8 o'clock specimen is the lateral edge of the 2 o'clock specimen]  COUNTS CORRECT:  YES  INDICATIONS FOR PROCEDURE:  Kim Griffin is a 22 y.o. (DOB: 1996-03-10) AA female whose primary care physician is [facility] and comes for right breast lumpectomy x 2 and targeted right axillary sentinel lymph node biopsy.   She was found to have a poorly differentiated right breast cancer with a positive node.  She was seen by Drs. Lindi Adie and Kinard at the [facility].  She has completed chemotx with significant reduction in her primary cancer.  The options for breast cancer treatment have been discussed with the patient. She elected to proceed with lumpectomy and axillary sentinel lymph node.     The indications and potential complications of surgery were explained to  the patient. Potential complications include, but are not limited to, bleeding, infection, the need for further surgery, and nerve injury.     She had a three I131 seed placed on 07/12/2018 in her right breast at [facility].  The seeds are in the 2 o'clock position and 8 o'clock position of the right breast, and in the right axilla..   In the holding area, her right areola was injected with 1 millicurie of Technitium Sulfur Colloid.  OPERATIVE NOTE:   The patient was taken to operating room # 8 at [facility] where she underwent a general anesthesia  supervised by Anesthesiologist: Oleta Mouse, MD CRNA: Blocker, Ernesta Amble, CRNA; Maryella Shivers, CRNABoth her breast and axilla were prepped with  ChloraPrep and sterilely draped.    A time-out and the surgical check list was reviewed.    I injected about 0.5 mL of 40% methylene blue around her right areola.   I first removed the power port.  I cut down to the tubing and removed this intact.  I then removed the port intact.  There was no bleeding.  The wounds were then closed with 3-0 vicryl subcutaneous sutures and the skin closed with a 4-0 Monocryl suture.     I turned attention to the cancer which was about at the 2 o'clock position of the right breast.   I used the Neoprobe to identify the I131 seed.  I made a right medial nipple circumareolar incision.  I tried to excise an area around the tumor of at least 1 cm.   I excised this block of breast tissue approximately 4 cm by 4 cm  in diameter.   I painted the lumpectomy specimen with the 6 color paint kit and did a specimen mammogram which confirmed the mass, clip, and the seed were all in the right position in the specimen.  The specimen was sent to pathology who called back to confirm that they have the seed and the specimen.   The second lesion was at 8 o'clock in the right breast.  I was able to access this lesion through the incision I already made in the circumareolar area.   I used the Neoprobe to identify the I131 seed.   I tried to excise an area around the tumor of at least 1 cm.   I excised this block of breast tissue approximately 2 cm by 3 cm  in diameter.   I painted the lumpectomy specimen with the 6 color paint kit and did a specimen mammogram which confirmed the mass, clip, and the seed were all in the right position in the specimen.  The specimen was sent to pathology who called back to confirm that they have the seed and the specimen.  [Note:  The medial edge of the 8 o'clock specimen is the lateral edge of the 2 o'clock specimen]   I then started the right deep targeted axillary sentinel lymph node dissection. I made an incision in the right axilla.  I found the seed and a hot area at the junction of the breast and the pectoralis major muscle, deep in the axilla. I cut down and  identified a hot node that had counts of 300 and the background has 15 counts. The lymph node was blue.  The seed was in the specimen. I checked her internal mammary nodes and supraclavicular nodes with the neoprobe and found no other hot area. The axillary node was then sent to pathology.    I then irrigated the wounds with saline. I infiltrated approximately 20 mL of 1/4% Marcaine between the incisions. I placed 4 clips to mark biopsy cavity, at 12, 3, 6, and 9 o'clock.  I then closed all the wounds in layers using 3-0 Vicryl sutures for the deep layer. At the skin, I closed the incisions with a 4-0 Monocryl suture. The incisions were then painted with Dermabond.  She had gauze place over the wounds and placed in a breast binder.   The patient tolerated the procedure well, was transported to the recovery room in good condition. Sponge and needle count were correct at the end of the case.   Final pathology is pending.   Alphonsa Overall, MD, [facility] Surgery Pager: [phone] Office phone:  [phone]

## 2018-07-13 NOTE — Progress Notes (Signed)
Assisted Dr. Moser with right, ultrasound guided, pectoralis block. Side rails up, monitors on throughout procedure. See vital signs in flow sheet. Tolerated Procedure well. 

## 2018-07-13 NOTE — Interval H&P Note (Signed)
History and Physical Interval Note:  07/13/2018 7:25 AM  [name]  has presented today for surgery, with the diagnosis of RIGHT BREAST CANCER   Family at bedside - 3 seeds identified by neoprobe.  The various methods of treatment have been discussed with the patient and family. After consideration of risks, benefits and other options for treatment, the patient has consented to  Procedure(s): RIGHT BREAST LUMPECTOMY X 2 WITH RADIOACTIVE SEED X 2, RIGHT SEED TARGETED AXILLARY LYMPH NODE EXCISION AND RIGHT SENTINEL LYMPH NODE BIOPSY (Right) REMOVAL PORT-A-CATH (Right) as a surgical intervention .  The patient's history has been reviewed, patient examined, no change in status, stable for surgery.  I have reviewed the patient's chart and labs.  Questions were answered to the patient's satisfaction.     Shann Medal

## 2018-07-13 NOTE — Progress Notes (Signed)
Extended leave paperwork successfully faxed to [facility] at [phone]. Mailed copy to patient address on file.

## 2018-07-13 NOTE — Anesthesia Procedure Notes (Signed)
Procedure Name: LMA Insertion Date/Time: 07/13/2018 7:44 AM Performed by: Signe Colt, CRNA Pre-anesthesia Checklist: Patient identified, Emergency Drugs available, Suction available and Patient being monitored Patient Re-evaluated:Patient Re-evaluated prior to induction Oxygen Delivery Method: Circle system utilized Preoxygenation: Pre-oxygenation with 100% oxygen Induction Type: IV induction Ventilation: Mask ventilation without difficulty LMA: LMA inserted LMA Size: 4.0 Number of attempts: 1 Airway Equipment and Method: Bite block Placement Confirmation: positive ETCO2 Tube secured with: Tape Dental Injury: Teeth and Oropharynx as per pre-operative assessment

## 2018-07-13 NOTE — Anesthesia Postprocedure Evaluation (Signed)
Anesthesia Post Note  Patient: Keyoni Lapinski Leatham  Procedure(s) Performed: RIGHT BREAST LUMPECTOMY X 2 WITH RADIOACTIVE SEED X 2, RIGHT SEED TARGETED AXILLARY LYMPH NODE EXCISION AND RIGHT SENTINEL LYMPH NODE BIOPSY (Right Breast) REMOVAL PORT-A-CATH (Right )     Patient location during evaluation: PACU Anesthesia Type: Regional and General Level of consciousness: awake and alert Pain management: pain level controlled Vital Signs Assessment: post-procedure vital signs reviewed and stable Respiratory status: spontaneous breathing, nonlabored ventilation, respiratory function stable and patient connected to nasal cannula oxygen Cardiovascular status: blood pressure returned to baseline and stable Postop Assessment: no apparent nausea or vomiting Anesthetic complications: no    Last Vitals:  Vitals:   07/13/18 1030 07/13/18 1052  BP: 114/69 121/83  Pulse: 100 96  Resp: 15 18  Temp:  36.7 C  SpO2: 99% 97%    Last Pain:  Vitals:   07/13/18 1052  TempSrc: Oral  PainSc: 2                  Kleigh Hoelzer

## 2018-07-16 ENCOUNTER — Encounter (HOSPITAL_BASED_OUTPATIENT_CLINIC_OR_DEPARTMENT_OTHER): Payer: Self-pay | Admitting: Surgery

## 2018-07-23 ENCOUNTER — Inpatient Hospital Stay: Payer: BLUE CROSS/BLUE SHIELD | Attending: Hematology and Oncology | Admitting: Hematology and Oncology

## 2018-07-23 DIAGNOSIS — Z170 Estrogen receptor positive status [ER+]: Secondary | ICD-10-CM

## 2018-07-23 DIAGNOSIS — C50211 Malignant neoplasm of upper-inner quadrant of right female breast: Secondary | ICD-10-CM | POA: Diagnosis not present

## 2018-07-23 NOTE — Progress Notes (Signed)
Patient Care Team: [facility] as PCP - General (Family Medicine) Nicholas Lose, MD as Consulting Physician (Hematology and Oncology) Alphonsa Overall, MD as Consulting Physician (General Surgery) Gery Pray, MD as Consulting Physician (Radiation Oncology)  DIAGNOSIS:  Encounter Diagnosis  Name Primary?  . Malignant neoplasm of upper-inner quadrant of right breast in female, estrogen receptor positive ([place])     SUMMARY OF ONCOLOGIC HISTORY:   Malignant neoplasm of upper-inner quadrant of right breast in female, estrogen receptor positive ([place])   01/10/2018 Initial Diagnosis    Palpable right breast mass with calcifications medial right breast at 1 o'clock position: 2.6 cm by ultrasound, 1 abnormal lymph node: Ultrasound biopsy of both the mass and the lymph node revealed grade 3 invasive ductal carcinoma ER 90%, PR 2%, Ki-67 70%, HER-2 negative ratio 0.96, T2 N1 stage II a AJCC 8 clinical stage    01/17/2018 Cancer Staging    Staging form: Breast, AJCC 8th Edition - Clinical stage from 01/17/2018: Stage IIB (cT2, cN1, cM0, G3, ER+, PR+, HER2-) - Signed by Gardenia Phlegm, NP on 06/20/2018    01/29/2018 - 06/12/2018 Neo-Adjuvant Chemotherapy    Dose dense Adriamycin and Cytoxan followed by Taxol x12    02/27/2018 Miscellaneous    Genetics negative    07/13/2018 Surgery    Right lumpectomy: Pathologic complete response 03 lymph nodes negative     CHIEF COMPLIANT: Follow-up after recent right lumpectomy  INTERVAL HISTORY: Kim Griffin is a 22 year old with above-mentioned history of right breast cancer who underwent neoadjuvant chemotherapy and had a pathologic complete response with complete absence of disease.  She is ecstatic to hear this result.  She is here today for follow-up.  REVIEW OF SYSTEMS:   Constitutional: Denies fevers, chills or abnormal weight loss Eyes: Denies blurriness of vision Ears, nose, mouth, throat, and face: Denies  mucositis or sore throat Respiratory: Denies cough, dyspnea or wheezes Cardiovascular: Denies palpitation, chest discomfort Gastrointestinal:  Denies nausea, heartburn or change in bowel habits Skin: Denies abnormal skin rashes Lymphatics: Denies new lymphadenopathy or easy bruising Neurological:Denies numbness, tingling or new weaknesses Behavioral/Psych: Mood is stable, no new changes  Extremities: No lower extremity edema Breast: Right lumpectomy   All other systems were reviewed with the patient and are negative.  I have reviewed the past medical history, past surgical history, social history and family history with the patient and they are unchanged from previous note.  ALLERGIES:  has No Known Allergies.  MEDICATIONS:  Current Outpatient Medications  Medication Sig Dispense Refill  . ferrous sulfate 325 (65 FE) MG tablet Take 325 mg by mouth daily with breakfast.    . HYDROcodone-acetaminophen (NORCO/VICODIN) 5-325 MG tablet Take 1 tablet by mouth every 6 (six) hours as needed for moderate pain. 20 tablet 0   No current facility-administered medications for this visit.     PHYSICAL EXAMINATION: ECOG PERFORMANCE STATUS: 1 - Symptomatic but completely ambulatory  Vitals:   07/23/18 1553  BP: 124/77  Pulse: 97  Resp: 18  Temp: 97.8 F (36.6 C)  SpO2: 100%   Filed Weights   07/23/18 1553  Weight: 129 lb 1.6 oz (58.6 kg)    GENERAL:alert, no distress and comfortable SKIN: skin color, texture, turgor are normal, no rashes or significant lesions EYES: normal, Conjunctiva are pink and non-injected, sclera clear OROPHARYNX:no exudate, no erythema and lips, buccal mucosa, and tongue normal  NECK: supple, thyroid normal size, non-tender, without nodularity LYMPH:  no palpable lymphadenopathy in the  cervical, axillary or inguinal LUNGS: clear to auscultation and percussion with normal breathing effort HEART: regular rate & rhythm and no murmurs and no lower extremity  edema ABDOMEN:abdomen soft, non-tender and normal bowel sounds MUSCULOSKELETAL:no cyanosis of digits and no clubbing  NEURO: alert & oriented x 3 with fluent speech, no focal motor/sensory deficits EXTREMITIES: No lower extremity edema BREAST: No palpable masses or nodules in either right or left breasts. No palpable axillary supraclavicular or infraclavicular adenopathy no breast tenderness or nipple discharge. (exam performed in the presence of a chaperone)  LABORATORY DATA:  I have reviewed the data as listed CMP Latest Ref Rng & Units 06/12/2018 06/05/2018 05/29/2018  Glucose 70 - 99 mg/dL 204(H) 99 209(H)  BUN 6 - 20 mg/dL 9 11 12   Creatinine 0.44 - 1.00 mg/dL 0.78 0.68 0.82  Sodium 135 - 145 mmol/L 139 141 138  Potassium 3.5 - 5.1 mmol/L 3.6 3.6 3.4(L)  Chloride 98 - 111 mmol/L 102 105 102  CO2 22 - 32 mmol/L 25 26 27   Calcium 8.9 - 10.3 mg/dL 9.3 9.6 9.7  Total Protein 6.5 - 8.1 g/dL 6.9 7.2 7.2  Total Bilirubin 0.3 - 1.2 mg/dL 0.3 0.2(L) 0.3  Alkaline Phos 38 - 126 U/L 69 70 70  AST 15 - 41 U/L 17 20 17   ALT 0 - 44 U/L 26 32 29    Lab Results  Component Value Date   WBC 3.5 (L) 06/12/2018   HGB 10.4 (L) 06/12/2018   HCT 31.7 (L) 06/12/2018   MCV 71.9 (L) 06/12/2018   PLT 337 06/12/2018   NEUTROABS 2.8 06/12/2018    ASSESSMENT & PLAN:  Malignant neoplasm of upper-inner quadrant of right breast in female, estrogen receptor positive ([place]) 01/10/2018:Palpable right breast mass with calcifications medial right breast at 1 o'clock position: 2.6 cm by ultrasound, 1 abnormal lymph node: Ultrasound biopsy of both the mass and the lymph node revealed grade 3 invasive ductal carcinoma ER 90%, PR 2%, Ki-67 70%, HER-2 negative ratio 0.96, T2 N1 stage II a AJCC 8 clinical stage  Recommendation: 1 Genetic testing: Negative.Staging scans: No metastatic disease 2.neoadjuvant chemotherapy with dose dense Adriamycin and Cytoxan followed by Taxol weekly x12 3.Followed by breast  conserving surgery on 07/13/2018: Pathologic complete response 4.Followed by adjuvant radiation 5.Followed by antiestrogen therapy and evaluation on Natalee clinical trial --------------------------------------------------------------------- Pathology counseling: I discussed the final pathology report of the patient provided  a copy of this report. I discussed the margins as well as lymph node surgeries. We also discussed the final staging along with previously performed ER/PR and HER-2/neu testing.  Recommendation: Return to clinic at the end of radiation to discuss starting antiestrogen therapy    No orders of the defined types were placed in this encounter.  The patient has a good understanding of the overall plan. she agrees with it. she will call with any problems that may develop before the next visit here.   Harriette Ohara, MD 07/23/18

## 2018-07-23 NOTE — Assessment & Plan Note (Signed)
01/10/2018:Palpable right breast mass with calcifications medial right breast at 1 o'clock position: 2.6 cm by ultrasound, 1 abnormal lymph node: Ultrasound biopsy of both the mass and the lymph node revealed grade 3 invasive ductal carcinoma ER 90%, PR 2%, Ki-67 70%, HER-2 negative ratio 0.96, T2 N1 stage II a AJCC 8 clinical stage  Recommendation: 1 Genetic testing: Negative.Staging scans: No metastatic disease 2.neoadjuvant chemotherapy with dose dense Adriamycin and Cytoxan followed by Taxol weekly x12 3.Followed by breast conserving surgery on 07/13/2018: Pathologic complete response 4.Followed by adjuvant radiation 5.Followed by antiestrogen therapy and evaluation on Natalee clinical trial --------------------------------------------------------------------- Pathology counseling: I discussed the final pathology report of the patient provided  a copy of this report. I discussed the margins as well as lymph node surgeries. We also discussed the final staging along with previously performed ER/PR and HER-2/neu testing.  Recommendation: Return to clinic at the end of radiation to discuss starting antiestrogen therapy

## 2018-07-25 NOTE — Progress Notes (Signed)
Location of Breast Cancer: RIGHT breast  Histology per Pathology Report: 01/10/18:  1. Breast, right, needle core biopsy, upper inner, 1 o'clock position - INVASIVE DUCTAL CARCINOMA WITH FOCAL NECROSIS, MSBR GRADE III. 2. Lymph node, needle/core biopsy, right axilla - METASTATIC CARCINOMA.  Receptor Status: ER(90%), PR (2%), Her2-neu (negative), Ki-(70%)  Did patient present with symptoms (if so, please note symptoms) or was this found on screening mammography?: The patient presented to the ER on 01/10/18 after palpating a mass in the right breast. Ultrasound of the right breast on 01/10/18 showed an irregular mass within the right breast at the 1:00 position, 1 cm from the nipple, measuring 2.6 cm. This corresponded with the palpable lump  Past/Anticipated interventions by surgeon, if any: 07/13/18: PROCEDURE:   Procedure(s): RIGHT BREAST LUMPECTOMY X 2 WITH RADIOACTIVE SEED X 2,              RIGHT SEED TARGETED AXILLARY LYMPH NODE EXCISION AND RIGHT SENTINEL LYMPH NODE BIOPSY             REMOVAL PORT-A-CATH,              Injection of peri areolar area of breast with methylene blue (1.0 cc), deep sentinel lymph node biopsy  SURGEON:   Alphonsa Overall, M.D.    Past/Anticipated interventions by medical oncology, if any: Chemotherapy Dr. Lindi Adie:  01/29/2018 - 06/12/2018 Neo-Adjuvant Chemotherapy     Dose dense Adriamycin and Cytoxan followed by Taxol x12     Lymphedema issues, if any:  Pt reports tightness in the same location as "soreness", area of back adjacent to axilla.  Pain issues, if any:  Pt reports "soreness" in area of back adjacent to axilla.  SAFETY ISSUES:  Prior radiation? No  Pacemaker/ICD? No  Possible current pregnancy? Pt denies any chance of pregnancy  Is the patient on methotrexate? No  Current Complaints / other details:  Ms. Nieblas presents today for consult with Dr. Sondra Come for Radiation Oncology. Pt is accompanied by mother.     Loma Sousa,  RN 07/30/2018,1:45 PM

## 2018-07-30 ENCOUNTER — Ambulatory Visit
Admission: RE | Admit: 2018-07-30 | Discharge: 2018-07-30 | Disposition: A | Discharge status: Home / Self Care | Payer: BLUE CROSS/BLUE SHIELD | Source: Ambulatory Visit | Attending: Radiation Oncology | Admitting: Radiation Oncology

## 2018-07-30 ENCOUNTER — Other Ambulatory Visit: Payer: Self-pay

## 2018-07-30 ENCOUNTER — Encounter: Payer: Self-pay | Admitting: Radiation Oncology

## 2018-07-30 VITALS — BP 106/64 | HR 88 | Temp 98.1°F | Resp 20 | Ht 60.25 in | Wt 131.2 lb

## 2018-07-30 DIAGNOSIS — Z170 Estrogen receptor positive status [ER+]: Secondary | ICD-10-CM | POA: Insufficient documentation

## 2018-07-30 DIAGNOSIS — C50211 Malignant neoplasm of upper-inner quadrant of right female breast: Secondary | ICD-10-CM | POA: Insufficient documentation

## 2018-07-30 NOTE — Progress Notes (Signed)
Radiation Oncology         [phone] ________________________________  Name: Kim Griffin MRN: [id number]  Date: 07/30/2018  DOB: 1996/03/29  Re-Evaluation Visit Note  CC: [facility]  Nicholas Lose, MD    ICD-10-CM   1. Malignant neoplasm of upper-inner quadrant of right breast in female, estrogen receptor positive ([place]) C50.211    Z17.0     Diagnosis: Pathologic staging: ypT0, ypN0 Cancer Staging Malignant neoplasm of upper-inner quadrant of right breast in female, estrogen receptor positive ([place]) Staging form: Breast, AJCC 8th Edition - Clinical stage from 01/17/2018: Stage IIB (cT2, cN1, cM0, G3, ER+, PR+, HER2-) - Signed by Gardenia Phlegm, NP on 06/20/2018  Narrative:  The patient returns today for re-evaluation.  she is doing well overall. She is accompanied by a family member today. She had received neo-adjuvant chemotherapy from 01/29/2018-06/12/2018 consistent of dose-dense adriamycin and cytoxan followed by Taxol x12. Her Medical Oncologist is Dr. Lindi Adie. She has been receiving zoladex injections, however, she hasn't been spoken too regarding birth control medications. She notes associated numbness to her posterior right upper arm. She denies right breast pain, right arm swelling, tingling, and any other symptoms.    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        She had a right needle core biopsy on 02/09/2018 that showed: Breast, right, needle core biopsy, 9 o'clock with atypical ductal hyperplasia. Intraductal papilloma.   She also had a right lumpectomy on 07/13/2018 that showed: Breast, lumpectomy, Right with benign breast tissue with treatment related changes. Healing biopsy site. There is no evidence of malignancy. Breast, lumpectomy, Right lateral with benign breast tissue with treatment related changes. Healing biopsy site. There is  no evidence of malignancy. Lymph nodes, regional resection, Right axillary with no evidence of carcinoma in 4 of 4 lymph nodes (0/4)  ALLERGIES:  has No Known Allergies.  Meds: Current Outpatient Medications  Medication Sig Dispense Refill  . ferrous sulfate 325 (65 FE) MG tablet Take 325 mg by mouth daily with breakfast.    . HYDROcodone-acetaminophen (NORCO/VICODIN) 5-325 MG tablet Take 1 tablet by mouth every 6 (six) hours as needed for moderate pain. 20 tablet 0   No current facility-administered medications for this encounter.     Physical Findings: The patient is in no acute distress. Patient is alert and oriented.  height is 5' 0.25" (1.53 m) and weight is 131 lb 3.2 oz (59.5 kg). Her oral temperature is 98.1 F (36.7 C). Her blood pressure is 106/64 and her pulse is 88. Her respiration is 20 and oxygen saturation is 100%.   Lungs are clear to auscultation bilaterally. Heart has regular rate and rhythm. No palpable cervical, supraclavicular, or axillary adenopathy. Abdomen soft, non-tender, normal bowel sounds.  Left breast with no palpable mass, nipple discharge, or bleeding. Pt has a small scar from the UOQ of the breast from her recent port-a-cath removal. Along the Right breast with a periareolar scar in the UIQ of the breast which is healing well without signs of drainage or infection. Patient already has a separate scar in the axillary region, which is healing well without signs of drainage or infection.     Lab Findings: Lab Results  Component Value Date   WBC 3.5 (L) 06/12/2018   HGB 10.4 (L) 06/12/2018   HCT 31.7 (L) 06/12/2018   MCV 71.9 (L) 06/12/2018  PLT 337 06/12/2018    Radiographic Findings: Nm Sentinel Node Inj-no Rpt (breast)  Result Date: 07/13/2018 Sulfur colloid was injected by the nuclear medicine technologist for melanoma sentinel node.   Mm Breast Surgical Specimen  Result Date: 07/13/2018 CLINICAL DATA:  Specimen a group radiograph status post  targeted right axillary lymph node dissection. EXAM: SPECIMEN RADIOGRAPH OF THE RIGHT AXILLA COMPARISON:  Previous exam(s). FINDINGS: Status post targeted right axillary lymph node dissection. The radioactive seed and biopsy marker clip are present and completely intact. These findings were communicated with the OR at 9:13 a.m. IMPRESSION: Specimen radiograph of the right axilla. Electronically Signed   By: Ammie Ferrier M.D.   On: 07/13/2018 10:08   Mm Breast Surgical Specimen  Result Date: 07/13/2018 CLINICAL DATA:  Specimen radiograph status post right breast excisional biopsy at a site of atypical ductal hyperplasia and papilloma. EXAM: SPECIMEN RADIOGRAPH OF THE RIGHT BREAST COMPARISON:  Previous exam(s). FINDINGS: Status post excision of the right breast. The radioactive seed and biopsy marker clip are present, completely intact, and were marked for pathology. These findings were communicated with the OR at 9:05 a.m. IMPRESSION: Specimen radiograph of the right breast. Electronically Signed   By: Ammie Ferrier M.D.   On: 07/13/2018 09:05   Mm Breast Surgical Specimen  Result Date: 07/13/2018 CLINICAL DATA:  Specimen radiograph status post right breast lumpectomy. EXAM: SPECIMEN RADIOGRAPH OF THE RIGHT BREAST COMPARISON:  Previous exam(s). FINDINGS: Status post excision of the right breast. The radioactive seed and biopsy marker clip are present, completely intact, and were marked for pathology. These findings were communicated with the OR at 8:50 a.m. IMPRESSION: Specimen radiograph of the right breast. Electronically Signed   By: Ammie Ferrier M.D.   On: 07/13/2018 08:50   Mm Rt Radioactive Seed Loc Mammo Guide  Result Date: 07/12/2018 CLINICAL DATA:  22 year old female presents for radioactive seed localization of 2 sites in the right breast, with atypical ductal hyperplasia and papilloma at site of dumbbell shaped biopsy marking clip and invasive ductal carcinoma at site of spiral  shaped biopsy marking clip. EXAM: MAMMOGRAPHIC GUIDED RADIOACTIVE SEED LOCALIZATION OF THE RIGHT BREAST COMPARISON:  Previous exam(s). FINDINGS: Patient presents for radioactive seed localization prior to right breast lumpectomy. I met with the patient and we discussed the procedure of seed localization including benefits and alternatives. We discussed the high likelihood of a successful procedure. We discussed the risks of the procedure including infection, bleeding, tissue injury and further surgery. We discussed the low dose of radioactivity involved in the procedure. Informed, written consent was given. The usual time-out protocol was performed immediately prior to the procedure. RIGHT BREAST 1 O'CLOCK SPIRAL CLIP: Using mammographic guidance, sterile technique, 1% lidocaine and an I-125 radioactive seed, the spiral shaped biopsy marking clip was localized using a medial to lateral approach. The follow-up mammogram images confirm the seed to be displaced 1 cm medial to the spiral shaped biopsy marking clip. This was subsequently discussed with Dr. Lucia Gaskins. Follow-up survey of the patient confirms presence of the radioactive seed. Order number of I-125 seed:  [id number]. Total activity:  3.009 millicuries reference Date: 07/04/2018 RIGHT BREAST DUMBBELL CLIP: Using mammographic guidance, sterile technique, 1% lidocaine and an I-125 radioactive seed, the dumbbell shaped biopsy marking clip was localized using a medial to lateral approach. The follow-up mammogram images confirm the seed in the expected location and were marked for Dr. Lucia Gaskins. Follow-up survey of the patient confirms presence of the radioactive seed. Order number of I-125  seed:  [id number]. Total activity:  2.706 millicuries reference Date: 07/04/2018 The patient tolerated the procedure well and was released from the [place]. She was given instructions regarding seed removal. IMPRESSION: 1. Radioactive seed localization of spiral clip at site of  biopsy proven malignancy at the 1 o'clock position, with the radioactive seed displaced approximately 1 cm medial to the clip. This was discussed and reviewed with Dr. Lucia Gaskins. 2. Radioactive seed localization of dumbbell shaped biopsy marking clip at site of biopsy-proven ADH and papilloma. 3. Radioactive seed localization right axillary lymph node (this was dictated separately on the ultrasound-guided needle localization report). Electronically Signed   By: Everlean Alstrom M.D.   On: 07/12/2018 14:38   Mm Rt Radio Seed Ea Add Lesion Loc Mammo  Result Date: 07/12/2018 CLINICAL DATA:  22 year old female presents for radioactive seed localization of 2 sites in the right breast, with atypical ductal hyperplasia and papilloma at site of dumbbell shaped biopsy marking clip and invasive ductal carcinoma at site of spiral shaped biopsy marking clip. EXAM: MAMMOGRAPHIC GUIDED RADIOACTIVE SEED LOCALIZATION OF THE RIGHT BREAST COMPARISON:  Previous exam(s). FINDINGS: Patient presents for radioactive seed localization prior to right breast lumpectomy. I met with the patient and we discussed the procedure of seed localization including benefits and alternatives. We discussed the high likelihood of a successful procedure. We discussed the risks of the procedure including infection, bleeding, tissue injury and further surgery. We discussed the low dose of radioactivity involved in the procedure. Informed, written consent was given. The usual time-out protocol was performed immediately prior to the procedure. RIGHT BREAST 1 O'CLOCK SPIRAL CLIP: Using mammographic guidance, sterile technique, 1% lidocaine and an I-125 radioactive seed, the spiral shaped biopsy marking clip was localized using a medial to lateral approach. The follow-up mammogram images confirm the seed to be displaced 1 cm medial to the spiral shaped biopsy marking clip. This was subsequently discussed with Dr. Lucia Gaskins. Follow-up survey of the patient confirms  presence of the radioactive seed. Order number of I-125 seed:  [id number]. Total activity:  1.761 millicuries reference Date: 07/04/2018 RIGHT BREAST DUMBBELL CLIP: Using mammographic guidance, sterile technique, 1% lidocaine and an I-125 radioactive seed, the dumbbell shaped biopsy marking clip was localized using a medial to lateral approach. The follow-up mammogram images confirm the seed in the expected location and were marked for Dr. Lucia Gaskins. Follow-up survey of the patient confirms presence of the radioactive seed. Order number of I-125 seed:  [id number]. Total activity:  6.948 millicuries reference Date: 07/04/2018 The patient tolerated the procedure well and was released from the [place]. She was given instructions regarding seed removal. IMPRESSION: 1. Radioactive seed localization of spiral clip at site of biopsy proven malignancy at the 1 o'clock position, with the radioactive seed displaced approximately 1 cm medial to the clip. This was discussed and reviewed with Dr. Lucia Gaskins. 2. Radioactive seed localization of dumbbell shaped biopsy marking clip at site of biopsy-proven ADH and papilloma. 3. Radioactive seed localization right axillary lymph node (this was dictated separately on the ultrasound-guided needle localization report). Electronically Signed   By: Everlean Alstrom M.D.   On: 07/12/2018 14:38   [place] Rt Radioactive Seed Loc  Result Date: 07/12/2018 CLINICAL DATA:  22 year old female presents for radioactive seed localization previously biopsied right axillary lymph node. EXAM: ULTRASOUND GUIDED RADIOACTIVE SEED LOCALIZATION OF THE RIGHT BREAST COMPARISON:  Previous exam(s). FINDINGS: Patient presents for radioactive seed localization prior to right breast lumpectomy. I met with the patient and we discussed  the procedure of seed localization including benefits and alternatives. We discussed the high likelihood of a successful procedure. We discussed the risks of the procedure including  infection, bleeding, tissue injury and further surgery. We discussed the low dose of radioactivity involved in the procedure. Informed, written consent was given. The usual time-out protocol was performed immediately prior to the procedure. Using ultrasound guidance, sterile technique, 1% lidocaine and an I-125 radioactive seed, the lymph node containing a spiral shaped HydroMARK clip in the right axilla was localized using a lateral to medial approach. The follow-up mammogram images confirm the seed in the expected location and were marked for Dr. Lucia Gaskins. Follow-up survey of the patient confirms presence of the radioactive seed. Order number of I-125 seed:  [id number]. Total activity:  2.330 millicuries reference Date: 07/04/2018 The patient tolerated the procedure well and was released from the [place]. She was given instructions regarding seed removal. IMPRESSION: Radioactive seed localization right axillary lymph node. No apparent complications. Electronically Signed   By: Everlean Alstrom M.D.   On: 07/12/2018 14:17    Impression: Pathologic staging: ypT0, ypN0, Invasive Ductal Carcinoma, ER(+), PR (+), HER2 (neg), grade 3  Cancer Staging Malignant neoplasm of upper-inner quadrant of right breast in female, estrogen receptor positive ([place]) Staging form: Breast, AJCC 8th Edition - Clinical stage from 01/17/2018: Stage IIB (cT2, cN1, cM0, G3, ER+, PR+, HER2-) - Signed by Gardenia Phlegm, NP on 06/20/2018  Pt would be a good candidate for breast conservation with radiation therapy directed to the right breast. I would also recommend  elective coverage of the axillary region at the time of her radiation treatments, given her biopsy proven lymph node involvement prior to neoadjuvant chemotherapy treatments. Since she had no residual tumor within the breast at the completion of her chemotherapy (complete pathologic response), I do not feel that she would require a boost to the tumor bed.    Today, I talked to the patient and family about the findings and work-up thus far.  We discussed the natural history of right breast and general treatment, highlighting the role of radiotherapy in the management.  We discussed the available radiation techniques, and focused on the details of logistics and delivery.  We reviewed the anticipated acute and late sequelae associated with radiation in this setting.  The patient was encouraged to ask questions that I answered to the best of my ability.  A patient consent form was discussed and signed.  We retained a copy for our records.  The patient would like to proceed with radiation and will be scheduled for CT simulation.   Plan:  CT simulation appointment on 08/13/2018 at 9 AM. Pt will receive anticipated 5.5 weeks of radiotherapy.   ____________________________________   Blair Promise, PhD, MD    This document serves as a record of services personally performed by Gery Pray, MD. It was created on his behalf by [employer], a trained medical scribe. The creation of this record is based on the scribe's personal observations and the provider's statements to them. This document has been checked and approved by the attending provider.

## 2018-08-09 ENCOUNTER — Encounter: Payer: Self-pay | Admitting: *Deleted

## 2018-08-13 ENCOUNTER — Ambulatory Visit
Admission: RE | Admit: 2018-08-13 | Discharge: 2018-08-13 | Disposition: A | Discharge status: Home / Self Care | Payer: BLUE CROSS/BLUE SHIELD | Source: Ambulatory Visit | Attending: Radiation Oncology | Admitting: Radiation Oncology

## 2018-08-13 DIAGNOSIS — Z510 Encounter for antineoplastic radiation therapy: Secondary | ICD-10-CM | POA: Insufficient documentation

## 2018-08-13 DIAGNOSIS — Z170 Estrogen receptor positive status [ER+]: Secondary | ICD-10-CM | POA: Insufficient documentation

## 2018-08-13 DIAGNOSIS — C50211 Malignant neoplasm of upper-inner quadrant of right female breast: Secondary | ICD-10-CM

## 2018-08-13 NOTE — Progress Notes (Signed)
  Radiation Oncology         (336) [phone] ________________________________  Name: Kim Griffin MRN: [id number]  Date: 08/13/2018  DOB: August 27, 1996  SIMULATION AND TREATMENT PLANNING NOTE    ICD-10-CM   1. Malignant neoplasm of upper-inner quadrant of right breast in female, estrogen receptor positive ([place]) C50.211    Z17.0     DIAGNOSIS:  Stage IIB (ypT0, ypN0) Right Breast Invasive Ductal Carcinoma, ER+/PR+, Her2-, Grade 3  NARRATIVE:  The patient was brought to the [facility].  Identity was confirmed.  All relevant records and images related to the planned course of therapy were reviewed.  The patient freely provided informed written consent to proceed with treatment after reviewing the details related to the planned course of therapy. The consent form was witnessed and verified by the simulation staff.  Then, the patient was set-up in a stable reproducible  supine position for radiation therapy.  CT images were obtained.  Surface markings were placed.  The CT images were loaded into the planning software.  Then the target and avoidance structures were contoured.  Treatment planning then occurred.  The radiation prescription was entered and confirmed.  Then, I designed and supervised the construction of a total of 5 medically necessary complex treatment devices.  I have requested : 3D Simulation  I have requested a DVH of the following structures: heart, lungs, lumpectomy cavity.  I have ordered:CBC  PLAN:  The patient will receive 50.4 Gy in 28 fractions.  The axillary and upper right internal mammary nodal chain will receive 45 gray in 25 fractions.   -----------------------------------  Blair Promise, PhD, MD  This document serves as a record of services personally performed by Gery Pray, MD. It was created on his behalf by Wilburn Mylar, a trained medical scribe. The creation of this record is based on the scribe's personal observations and the provider's  statements to them. This document has been checked and approved by the attending provider.

## 2018-08-14 ENCOUNTER — Telehealth: Payer: Self-pay | Admitting: Hematology and Oncology

## 2018-08-14 NOTE — Telephone Encounter (Signed)
Appts added and patient notified per 10/15 sch msg

## 2018-08-16 ENCOUNTER — Ambulatory Visit
Admission: RE | Admit: 2018-08-16 | Discharge: 2018-08-16 | Disposition: A | Discharge status: Home / Self Care | Payer: BLUE CROSS/BLUE SHIELD | Source: Ambulatory Visit | Attending: Radiation Oncology | Admitting: Radiation Oncology

## 2018-08-16 DIAGNOSIS — Z170 Estrogen receptor positive status [ER+]: Principal | ICD-10-CM

## 2018-08-16 DIAGNOSIS — C50211 Malignant neoplasm of upper-inner quadrant of right female breast: Secondary | ICD-10-CM | POA: Diagnosis not present

## 2018-08-20 ENCOUNTER — Ambulatory Visit: Payer: BLUE CROSS/BLUE SHIELD | Admitting: Radiation Oncology

## 2018-08-20 DIAGNOSIS — C50211 Malignant neoplasm of upper-inner quadrant of right female breast: Secondary | ICD-10-CM | POA: Diagnosis not present

## 2018-08-21 ENCOUNTER — Ambulatory Visit: Payer: BLUE CROSS/BLUE SHIELD

## 2018-08-21 NOTE — Progress Notes (Signed)
Radiation Oncology         [phone] ________________________________  Name: ANTARA BRECHEISEN MRN: [id number]  Date: 08/16/2018  DOB: 1996/04/26  SIMULATION AND TREATMENT PLANNING NOTE    ICD-10-CM   1. Malignant neoplasm of upper-inner quadrant of right breast in female, estrogen receptor positive ([place]) C50.211    Z17.0     DIAGNOSIS:  Stage IIB (ypT0, ypN0) Right Breast Invasive Ductal Carcinoma, ER+/PR+, Her2-, Grade 3  Patient underwent re-simulation today.  On her initial CT simulation earlier this week patient's heart was noted to be  very anterior in location. To cover the internal mammary node chain she would have significant dose her heart. Patient was brought back and scan of breath hold technique.  This resulted in the heart being more posteriorly within the chest and allowing better coverage of the ipsilateral internal mammary chain.  NARRATIVE:  The patient was brought to the [facility].  Identity was confirmed.  All relevant records and images related to the planned course of therapy were reviewed.  The patient freely provided informed written consent to proceed with treatment after reviewing the details related to the planned course of therapy. The consent form was witnessed and verified by the simulation staff.  Then, the patient was set-up in a stable reproducible  supine position for radiation therapy.  CT images were obtained.  Surface markings were placed.  The CT images were loaded into the planning software.  Then the target and avoidance structures were contoured.  Treatment planning then occurred.  The radiation prescription was entered and confirmed.  Then, I designed and supervised the construction of a total of 5 medically necessary complex treatment devices.  I have requested : 3D Simulation  I have requested a DVH of the following structures: heart, lungs, lumpectomy cavity.  I have ordered:CBC  PLAN:  The patient will receive 50.4 Gy in 28  fractions.  The axillary and upper right internal mammary nodal chain will receive 45 gray in 25 fractions.   -----------------------------------  Special treatment procedure was performed today due to the extra time and effort required by myself to plan and prepare this patient for deep inspiration breath hold technique.  I have determined cardiac sparing to be of benefit to this patient to prevent long term cardiac damage due to radiation of the heart.  Bellows were placed on the patient's abdomen. To facilitate cardiac sparing, the patient was coached by the radiation therapists on breath hold techniques and breathing practice was performed. Practice waveforms were obtained. The patient was then scanned while maintaining breath hold in the treatment position.  This image was then transferred over to the imaging specialist. The imaging specialist then created a fusion of the free breathing and breath hold scans using the chest wall as the stable structure. I personally reviewed the fusion in axial, coronal and sagittal image planes.  Excellent cardiac sparing was obtained.  I felt the patient is an appropriate candidate for breath hold and the patient will be treated as such.  The image fusion was then reviewed with the patient to reinforce the necessity of reproducible breath hold.   Blair Promise, PhD, MD  This document serves as a record of services personally performed by Gery Pray, MD. It was created on his behalf by Wilburn Mylar, a trained medical scribe. The creation of this record is based on the scribe's personal observations and the provider's statements to them. This document has been checked and approved by the  attending provider.

## 2018-08-22 ENCOUNTER — Ambulatory Visit: Payer: BLUE CROSS/BLUE SHIELD

## 2018-08-23 ENCOUNTER — Ambulatory Visit
Admission: RE | Admit: 2018-08-23 | Discharge: 2018-08-23 | Disposition: A | Discharge status: Home / Self Care | Payer: BLUE CROSS/BLUE SHIELD | Source: Ambulatory Visit | Attending: Radiation Oncology | Admitting: Radiation Oncology

## 2018-08-23 DIAGNOSIS — C50211 Malignant neoplasm of upper-inner quadrant of right female breast: Secondary | ICD-10-CM | POA: Diagnosis not present

## 2018-08-24 ENCOUNTER — Ambulatory Visit
Admission: RE | Admit: 2018-08-24 | Discharge: 2018-08-24 | Disposition: A | Discharge status: Home / Self Care | Payer: BLUE CROSS/BLUE SHIELD | Source: Ambulatory Visit | Attending: Radiation Oncology | Admitting: Radiation Oncology

## 2018-08-24 DIAGNOSIS — C50211 Malignant neoplasm of upper-inner quadrant of right female breast: Secondary | ICD-10-CM | POA: Diagnosis not present

## 2018-08-27 ENCOUNTER — Ambulatory Visit
Admission: RE | Admit: 2018-08-27 | Discharge: 2018-08-27 | Disposition: A | Discharge status: Home / Self Care | Payer: BLUE CROSS/BLUE SHIELD | Source: Ambulatory Visit | Attending: Radiation Oncology | Admitting: Radiation Oncology

## 2018-08-27 DIAGNOSIS — C50211 Malignant neoplasm of upper-inner quadrant of right female breast: Secondary | ICD-10-CM | POA: Diagnosis not present

## 2018-08-28 ENCOUNTER — Ambulatory Visit
Admission: RE | Admit: 2018-08-28 | Discharge: 2018-08-28 | Disposition: A | Discharge status: Home / Self Care | Payer: BLUE CROSS/BLUE SHIELD | Source: Ambulatory Visit | Attending: Radiation Oncology | Admitting: Radiation Oncology

## 2018-08-28 DIAGNOSIS — C50211 Malignant neoplasm of upper-inner quadrant of right female breast: Secondary | ICD-10-CM | POA: Diagnosis not present

## 2018-08-28 DIAGNOSIS — Z170 Estrogen receptor positive status [ER+]: Principal | ICD-10-CM

## 2018-08-28 MED ORDER — RADIAPLEXRX EX GEL
Freq: Two times a day (BID) | CUTANEOUS | Status: DC
  Administered 2018-08-28: 17:00:00 via TOPICAL

## 2018-08-28 MED ORDER — ALRA NON-METALLIC DEODORANT (RAD-ONC)
1.0000 "application " | Freq: Once | TOPICAL | Status: AC
  Administered 2018-08-28: 1 via TOPICAL

## 2018-08-29 ENCOUNTER — Ambulatory Visit
Admission: RE | Admit: 2018-08-29 | Discharge: 2018-08-29 | Disposition: A | Discharge status: Home / Self Care | Payer: BLUE CROSS/BLUE SHIELD | Source: Ambulatory Visit | Attending: Radiation Oncology | Admitting: Radiation Oncology

## 2018-08-29 DIAGNOSIS — C50211 Malignant neoplasm of upper-inner quadrant of right female breast: Secondary | ICD-10-CM | POA: Diagnosis not present

## 2018-08-30 ENCOUNTER — Ambulatory Visit
Admission: RE | Admit: 2018-08-30 | Discharge: 2018-08-30 | Disposition: A | Discharge status: Home / Self Care | Payer: BLUE CROSS/BLUE SHIELD | Source: Ambulatory Visit | Attending: Radiation Oncology | Admitting: Radiation Oncology

## 2018-08-30 DIAGNOSIS — C50211 Malignant neoplasm of upper-inner quadrant of right female breast: Secondary | ICD-10-CM | POA: Diagnosis not present

## 2018-08-31 ENCOUNTER — Ambulatory Visit
Admission: RE | Admit: 2018-08-31 | Discharge: 2018-08-31 | Disposition: A | Discharge status: Home / Self Care | Payer: BLUE CROSS/BLUE SHIELD | Source: Ambulatory Visit | Attending: Radiation Oncology | Admitting: Radiation Oncology

## 2018-08-31 DIAGNOSIS — C50211 Malignant neoplasm of upper-inner quadrant of right female breast: Secondary | ICD-10-CM | POA: Insufficient documentation

## 2018-08-31 DIAGNOSIS — Z170 Estrogen receptor positive status [ER+]: Secondary | ICD-10-CM | POA: Insufficient documentation

## 2018-08-31 DIAGNOSIS — Z510 Encounter for antineoplastic radiation therapy: Secondary | ICD-10-CM | POA: Diagnosis present

## 2018-09-03 ENCOUNTER — Ambulatory Visit
Admission: RE | Admit: 2018-09-03 | Discharge: 2018-09-03 | Disposition: A | Discharge status: Home / Self Care | Payer: BLUE CROSS/BLUE SHIELD | Source: Ambulatory Visit | Attending: Radiation Oncology | Admitting: Radiation Oncology

## 2018-09-03 DIAGNOSIS — C50211 Malignant neoplasm of upper-inner quadrant of right female breast: Secondary | ICD-10-CM | POA: Diagnosis not present

## 2018-09-04 ENCOUNTER — Ambulatory Visit
Admission: RE | Admit: 2018-09-04 | Discharge: 2018-09-04 | Disposition: A | Discharge status: Home / Self Care | Payer: BLUE CROSS/BLUE SHIELD | Source: Ambulatory Visit | Attending: Radiation Oncology | Admitting: Radiation Oncology

## 2018-09-04 DIAGNOSIS — C50211 Malignant neoplasm of upper-inner quadrant of right female breast: Secondary | ICD-10-CM | POA: Diagnosis not present

## 2018-09-05 ENCOUNTER — Ambulatory Visit
Admission: RE | Admit: 2018-09-05 | Discharge: 2018-09-05 | Disposition: A | Discharge status: Home / Self Care | Payer: BLUE CROSS/BLUE SHIELD | Source: Ambulatory Visit | Attending: Radiation Oncology | Admitting: Radiation Oncology

## 2018-09-05 DIAGNOSIS — C50211 Malignant neoplasm of upper-inner quadrant of right female breast: Secondary | ICD-10-CM | POA: Diagnosis not present

## 2018-09-06 ENCOUNTER — Ambulatory Visit
Admission: RE | Admit: 2018-09-06 | Discharge: 2018-09-06 | Disposition: A | Discharge status: Home / Self Care | Payer: BLUE CROSS/BLUE SHIELD | Source: Ambulatory Visit | Attending: Radiation Oncology | Admitting: Radiation Oncology

## 2018-09-06 DIAGNOSIS — C50211 Malignant neoplasm of upper-inner quadrant of right female breast: Secondary | ICD-10-CM | POA: Diagnosis not present

## 2018-09-07 ENCOUNTER — Ambulatory Visit
Admission: RE | Admit: 2018-09-07 | Discharge: 2018-09-07 | Disposition: A | Discharge status: Home / Self Care | Payer: BLUE CROSS/BLUE SHIELD | Source: Ambulatory Visit | Attending: Radiation Oncology | Admitting: Radiation Oncology

## 2018-09-07 DIAGNOSIS — C50211 Malignant neoplasm of upper-inner quadrant of right female breast: Secondary | ICD-10-CM | POA: Diagnosis not present

## 2018-09-10 ENCOUNTER — Ambulatory Visit
Admission: RE | Admit: 2018-09-10 | Discharge: 2018-09-10 | Disposition: A | Discharge status: Home / Self Care | Payer: BLUE CROSS/BLUE SHIELD | Source: Ambulatory Visit | Attending: Radiation Oncology | Admitting: Radiation Oncology

## 2018-09-10 DIAGNOSIS — C50211 Malignant neoplasm of upper-inner quadrant of right female breast: Secondary | ICD-10-CM | POA: Diagnosis not present

## 2018-09-11 ENCOUNTER — Ambulatory Visit
Admission: RE | Admit: 2018-09-11 | Discharge: 2018-09-11 | Disposition: A | Discharge status: Home / Self Care | Payer: BLUE CROSS/BLUE SHIELD | Source: Ambulatory Visit | Attending: Radiation Oncology | Admitting: Radiation Oncology

## 2018-09-11 DIAGNOSIS — C50211 Malignant neoplasm of upper-inner quadrant of right female breast: Secondary | ICD-10-CM | POA: Diagnosis not present

## 2018-09-12 ENCOUNTER — Ambulatory Visit
Admission: RE | Admit: 2018-09-12 | Discharge: 2018-09-12 | Disposition: A | Discharge status: Home / Self Care | Payer: BLUE CROSS/BLUE SHIELD | Source: Ambulatory Visit | Attending: Radiation Oncology | Admitting: Radiation Oncology

## 2018-09-12 DIAGNOSIS — C50211 Malignant neoplasm of upper-inner quadrant of right female breast: Secondary | ICD-10-CM | POA: Diagnosis not present

## 2018-09-13 ENCOUNTER — Ambulatory Visit
Admission: RE | Admit: 2018-09-13 | Discharge: 2018-09-13 | Disposition: A | Discharge status: Home / Self Care | Payer: BLUE CROSS/BLUE SHIELD | Source: Ambulatory Visit | Attending: Radiation Oncology | Admitting: Radiation Oncology

## 2018-09-13 DIAGNOSIS — C50211 Malignant neoplasm of upper-inner quadrant of right female breast: Secondary | ICD-10-CM | POA: Diagnosis not present

## 2018-09-14 ENCOUNTER — Ambulatory Visit
Admission: RE | Admit: 2018-09-14 | Discharge: 2018-09-14 | Disposition: A | Discharge status: Home / Self Care | Payer: BLUE CROSS/BLUE SHIELD | Source: Ambulatory Visit | Attending: Radiation Oncology | Admitting: Radiation Oncology

## 2018-09-14 DIAGNOSIS — C50211 Malignant neoplasm of upper-inner quadrant of right female breast: Secondary | ICD-10-CM | POA: Diagnosis not present

## 2018-09-17 ENCOUNTER — Ambulatory Visit
Admission: RE | Admit: 2018-09-17 | Discharge: 2018-09-17 | Disposition: A | Discharge status: Home / Self Care | Payer: BLUE CROSS/BLUE SHIELD | Source: Ambulatory Visit | Attending: Radiation Oncology | Admitting: Radiation Oncology

## 2018-09-17 DIAGNOSIS — C50211 Malignant neoplasm of upper-inner quadrant of right female breast: Secondary | ICD-10-CM | POA: Diagnosis not present

## 2018-09-18 ENCOUNTER — Ambulatory Visit
Admission: RE | Admit: 2018-09-18 | Discharge: 2018-09-18 | Disposition: A | Discharge status: Home / Self Care | Payer: BLUE CROSS/BLUE SHIELD | Source: Ambulatory Visit | Attending: Radiation Oncology | Admitting: Radiation Oncology

## 2018-09-18 DIAGNOSIS — C50211 Malignant neoplasm of upper-inner quadrant of right female breast: Secondary | ICD-10-CM | POA: Diagnosis not present

## 2018-09-19 ENCOUNTER — Ambulatory Visit
Admission: RE | Admit: 2018-09-19 | Discharge: 2018-09-19 | Disposition: A | Discharge status: Home / Self Care | Payer: BLUE CROSS/BLUE SHIELD | Source: Ambulatory Visit | Attending: Radiation Oncology | Admitting: Radiation Oncology

## 2018-09-19 DIAGNOSIS — C50211 Malignant neoplasm of upper-inner quadrant of right female breast: Secondary | ICD-10-CM | POA: Diagnosis not present

## 2018-09-20 ENCOUNTER — Ambulatory Visit
Admission: RE | Admit: 2018-09-20 | Discharge: 2018-09-20 | Disposition: A | Discharge status: Home / Self Care | Payer: BLUE CROSS/BLUE SHIELD | Source: Ambulatory Visit | Attending: Radiation Oncology | Admitting: Radiation Oncology

## 2018-09-20 DIAGNOSIS — C50211 Malignant neoplasm of upper-inner quadrant of right female breast: Secondary | ICD-10-CM | POA: Diagnosis not present

## 2018-09-21 ENCOUNTER — Ambulatory Visit
Admission: RE | Admit: 2018-09-21 | Discharge: 2018-09-21 | Disposition: A | Discharge status: Home / Self Care | Payer: BLUE CROSS/BLUE SHIELD | Source: Ambulatory Visit | Attending: Radiation Oncology | Admitting: Radiation Oncology

## 2018-09-21 DIAGNOSIS — C50211 Malignant neoplasm of upper-inner quadrant of right female breast: Secondary | ICD-10-CM | POA: Diagnosis not present

## 2018-09-23 ENCOUNTER — Ambulatory Visit
Admission: RE | Admit: 2018-09-23 | Discharge: 2018-09-23 | Disposition: A | Discharge status: Home / Self Care | Payer: BLUE CROSS/BLUE SHIELD | Source: Ambulatory Visit | Attending: Radiation Oncology | Admitting: Radiation Oncology

## 2018-09-23 DIAGNOSIS — C50211 Malignant neoplasm of upper-inner quadrant of right female breast: Secondary | ICD-10-CM | POA: Diagnosis not present

## 2018-09-24 ENCOUNTER — Telehealth: Payer: Self-pay | Admitting: Hematology and Oncology

## 2018-09-24 ENCOUNTER — Ambulatory Visit
Admission: RE | Admit: 2018-09-24 | Discharge: 2018-09-24 | Disposition: A | Discharge status: Home / Self Care | Payer: BLUE CROSS/BLUE SHIELD | Source: Ambulatory Visit | Attending: Radiation Oncology | Admitting: Radiation Oncology

## 2018-09-24 ENCOUNTER — Inpatient Hospital Stay: Payer: BLUE CROSS/BLUE SHIELD | Attending: Hematology and Oncology | Admitting: Hematology and Oncology

## 2018-09-24 ENCOUNTER — Inpatient Hospital Stay: Payer: BLUE CROSS/BLUE SHIELD

## 2018-09-24 DIAGNOSIS — C50211 Malignant neoplasm of upper-inner quadrant of right female breast: Secondary | ICD-10-CM

## 2018-09-24 DIAGNOSIS — Z170 Estrogen receptor positive status [ER+]: Secondary | ICD-10-CM | POA: Diagnosis not present

## 2018-09-24 DIAGNOSIS — Z5111 Encounter for antineoplastic chemotherapy: Secondary | ICD-10-CM | POA: Diagnosis not present

## 2018-09-24 MED ORDER — GOSERELIN ACETATE 3.6 MG ~~LOC~~ IMPL
DRUG_IMPLANT | SUBCUTANEOUS | Status: AC
  Filled 2018-09-24: qty 3.6

## 2018-09-24 MED ORDER — LETROZOLE 2.5 MG PO TABS: 2.5000 mg | ORAL_TABLET | Freq: Every day | ORAL | 3 refills | Status: DC

## 2018-09-24 MED ORDER — GOSERELIN ACETATE 3.6 MG ~~LOC~~ IMPL: 3.6000 mg | DRUG_IMPLANT | Freq: Once | SUBCUTANEOUS | Status: DC

## 2018-09-24 NOTE — Progress Notes (Signed)
Patient Care Team: [facility] as PCP - General (Family Medicine) Nicholas Lose, MD as Consulting Physician (Hematology and Oncology) Alphonsa Overall, MD as Consulting Physician (General Surgery) Gery Pray, MD as Consulting Physician (Radiation Oncology)  DIAGNOSIS:  Encounter Diagnosis  Name Primary?  . Malignant neoplasm of upper-inner quadrant of right breast in female, estrogen receptor positive ([place])     SUMMARY OF ONCOLOGIC HISTORY:   Malignant neoplasm of upper-inner quadrant of right breast in female, estrogen receptor positive ([place])   01/10/2018 Initial Diagnosis    Palpable right breast mass with calcifications medial right breast at 1 o'clock position: 2.6 cm by ultrasound, 1 abnormal lymph node: Ultrasound biopsy of both the mass and the lymph node revealed grade 3 invasive ductal carcinoma ER 90%, PR 2%, Ki-67 70%, HER-2 negative ratio 0.96, T2 N1 stage II a AJCC 8 clinical stage    01/17/2018 Cancer Staging    Staging form: Breast, AJCC 8th Edition - Clinical stage from 01/17/2018: Stage IIB (cT2, cN1, cM0, G3, ER+, PR+, HER2-) - Signed by Gardenia Phlegm, NP on 06/20/2018    01/29/2018 - 06/12/2018 Neo-Adjuvant Chemotherapy    Dose dense Adriamycin and Cytoxan followed by Taxol x12    02/27/2018 Miscellaneous    Genetics negative    07/13/2018 Surgery    Right lumpectomy: Pathologic complete response 03 lymph nodes negative     CHIEF COMPLIANT: Ongoing adjuvant radiation  INTERVAL HISTORY: Kim Griffin is a 22 year old with above-mentioned history of right breast cancer who underwent neoadjuvant chemotherapy followed by right lumpectomy and had a complete pathologic response.  She is here to discuss adjuvant antiestrogen therapy.  She is currently going through adjuvant radiation and appears to be tolerating it fairly well.  She has some radiation dermatitis.  REVIEW OF SYSTEMS:   Constitutional: Denies fevers, chills or abnormal  weight loss Eyes: Denies blurriness of vision Ears, nose, mouth, throat, and face: Denies mucositis or sore throat Respiratory: Denies cough, dyspnea or wheezes Cardiovascular: Denies palpitation, chest discomfort Gastrointestinal:  Denies nausea, heartburn or change in bowel habits Skin: Denies abnormal skin rashes Lymphatics: Denies new lymphadenopathy or easy bruising Neurological:Denies numbness, tingling or new weaknesses Behavioral/Psych: Mood is stable, no new changes  Extremities: No lower extremity edema Breast: Radiation dermatitis All other systems were reviewed with the patient and are negative.  I have reviewed the past medical history, past surgical history, social history and family history with the patient and they are unchanged from previous note.  ALLERGIES:  has No Known Allergies.  MEDICATIONS:  Current Outpatient Medications  Medication Sig Dispense Refill  . ferrous sulfate 325 (65 FE) MG tablet Take 325 mg by mouth daily with breakfast.    . HYDROcodone-acetaminophen (NORCO/VICODIN) 5-325 MG tablet Take 1 tablet by mouth every 6 (six) hours as needed for moderate pain. 20 tablet 0  . letrozole (FEMARA) 2.5 MG tablet Take 1 tablet (2.5 mg total) by mouth daily. 90 tablet 3   No current facility-administered medications for this visit.    Facility-Administered Medications Ordered in Other Visits  Medication Dose Route Frequency Provider Last Rate Last Dose  . goserelin (ZOLADEX) injection 3.6 mg  3.6 mg Subcutaneous Once Nicholas Lose, MD        PHYSICAL EXAMINATION: ECOG PERFORMANCE STATUS: 1 - Symptomatic but completely ambulatory  Vitals:   09/24/18 1046  BP: 120/70  Pulse: 95  Resp: 17  Temp: 97.7 F (36.5 C)  SpO2: 100%   Filed Weights  09/24/18 1046  Weight: 131 lb (59.4 kg)    GENERAL:alert, no distress and comfortable SKIN: skin color, texture, turgor are normal, no rashes or significant lesions EYES: normal, Conjunctiva are pink and  non-injected, sclera clear OROPHARYNX:no exudate, no erythema and lips, buccal mucosa, and tongue normal  NECK: supple, thyroid normal size, non-tender, without nodularity LYMPH:  no palpable lymphadenopathy in the cervical, axillary or inguinal LUNGS: clear to auscultation and percussion with normal breathing effort HEART: regular rate & rhythm and no murmurs and no lower extremity edema ABDOMEN:abdomen soft, non-tender and normal bowel sounds MUSCULOSKELETAL:no cyanosis of digits and no clubbing  NEURO: alert & oriented x 3 with fluent speech, no focal motor/sensory deficits EXTREMITIES: No lower extremity edema   LABORATORY DATA:  I have reviewed the data as listed CMP Latest Ref Rng & Units 06/12/2018 06/05/2018 05/29/2018  Glucose 70 - 99 mg/dL 204(H) 99 209(H)  BUN 6 - 20 mg/dL _0 Creatinine 0.44 - 1.00 mg/dL 0.78 0.68 0.82  Sodium 135 - 145 mmol/L 139 141 138  Potassium 3.5 - 5.1 mmol/L 3.6 3.6 3.4(L)  Chloride 98 - 111 mmol/L 102 105 102  CO2 22 - 32 mmol/L _1 Calcium 8.9 - 10.3 mg/dL 9.3 9.6 9.7  Total Protein 6.5 - 8.1 g/dL 6.9 7.2 7.2  Total Bilirubin 0.3 - 1.2 mg/dL 0.3 0.2(L) 0.3  Alkaline Phos 38 - 126 U/L 69 70 70  AST 15 - 41 U/L _2 ALT 0 - 44 U/L 26 32 29    Lab Results  Component Value Date   WBC 3.5 (L) 06/12/2018   HGB 10.4 (L) 06/12/2018   HCT 31.7 (L) 06/12/2018   MCV 71.9 (L) 06/12/2018   PLT 337 06/12/2018   NEUTROABS 2.8 06/12/2018    ASSESSMENT & PLAN:  Malignant neoplasm of upper-inner quadrant of right breast in female, estrogen receptor positive ([place]) 01/10/2018:Palpable right breast mass with calcifications medial right breast at 1 o'clock position: 2.6 cm by ultrasound, 1 abnormal lymph node: Ultrasound biopsy of both the mass and the lymph node revealed grade 3 invasive ductal carcinoma ER 90%, PR 2%, Ki-67 70%, HER-2 negative ratio 0.96, T2 N1 stage II a AJCC 8 clinical stage  Recommendation: 1 Genetic testing:  Negative.Staging scans: No metastatic disease 2.neoadjuvant chemotherapy with dose dense Adriamycin and Cytoxan followed by Taxol weekly x12 3.Followed by breast conserving surgery on 07/13/2018: Pathologic complete response 4.Followed by adjuvant radiation started 08/24/2018   5.Followed by antiestrogen therapy and evaluation on Natalee clinical trial ---------------------------------------------------------------------  Recommendation: Antiestrogen therapy with ovarian suppression and letrozole I had a lengthy discussion with her that if she wishes to have children and then we will stop her antiestrogen therapy. Patient is not planning on having any children in the coming year. Return to clinic in 3 months for survivorship care plan visit  No orders of the defined types were placed in this encounter.  The patient has a good understanding of the overall plan. she agrees with it. she will call with any problems that may develop before the next visit here.   Harriette Ohara, MD 09/24/18

## 2018-09-24 NOTE — Telephone Encounter (Signed)
Gave patient avs and calendars Nov - Feb.

## 2018-09-24 NOTE — Assessment & Plan Note (Signed)
01/10/2018:Palpable right breast mass with calcifications medial right breast at 1 o'clock position: 2.6 cm by ultrasound, 1 abnormal lymph node: Ultrasound biopsy of both the mass and the lymph node revealed grade 3 invasive ductal carcinoma ER 90%, PR 2%, Ki-67 70%, HER-2 negative ratio 0.96, T2 N1 stage II a AJCC 8 clinical stage  Recommendation: 1 Genetic testing: Negative.Staging scans: No metastatic disease 2.neoadjuvant chemotherapy with dose dense Adriamycin and Cytoxan followed by Taxol weekly x12 3.Followed by breast conserving surgery on 07/13/2018: Pathologic complete response 4.Followed by adjuvant radiation started 08/24/2018 antiestrogen therapy with 5.Followed by antiestrogen therapy and evaluation on Natalee clinical trial ---------------------------------------------------------------------  Recommendation: Antiestrogen therapy with ovarian suppression and letrozole Consideration for Natalee clinical trial

## 2018-09-24 NOTE — Telephone Encounter (Signed)
Scheduled appt per 11/25 sch message - pt is aware of inj appt.

## 2018-09-25 ENCOUNTER — Ambulatory Visit
Admission: RE | Admit: 2018-09-25 | Discharge: 2018-09-25 | Disposition: A | Discharge status: Home / Self Care | Payer: BLUE CROSS/BLUE SHIELD | Source: Ambulatory Visit | Attending: Radiation Oncology | Admitting: Radiation Oncology

## 2018-09-25 ENCOUNTER — Inpatient Hospital Stay: Payer: BLUE CROSS/BLUE SHIELD

## 2018-09-25 VITALS — BP 123/79 | HR 107 | Temp 98.0°F | Resp 18

## 2018-09-25 DIAGNOSIS — C50211 Malignant neoplasm of upper-inner quadrant of right female breast: Secondary | ICD-10-CM | POA: Diagnosis not present

## 2018-09-25 DIAGNOSIS — Z170 Estrogen receptor positive status [ER+]: Secondary | ICD-10-CM

## 2018-09-25 DIAGNOSIS — Z95828 Presence of other vascular implants and grafts: Secondary | ICD-10-CM

## 2018-09-25 DIAGNOSIS — Z5111 Encounter for antineoplastic chemotherapy: Secondary | ICD-10-CM | POA: Diagnosis not present

## 2018-09-25 MED ORDER — GOSERELIN ACETATE 3.6 MG ~~LOC~~ IMPL
DRUG_IMPLANT | SUBCUTANEOUS | Status: AC
  Filled 2018-09-25: qty 3.6

## 2018-09-25 MED ORDER — GOSERELIN ACETATE 3.6 MG ~~LOC~~ IMPL
3.6000 mg | DRUG_IMPLANT | Freq: Once | SUBCUTANEOUS | Status: AC
  Administered 2018-09-25: 3.6 mg via SUBCUTANEOUS

## 2018-09-25 NOTE — Patient Instructions (Signed)
Goserelin (zoladex) injection What is this medicine? GOSERELIN (GOE se rel in) is similar to a hormone found in the body. It lowers the amount of sex hormones that the body makes. Men will have lower testosterone levels and women will have lower estrogen levels while taking this medicine. In men, this medicine is used to treat prostate cancer; the injection is either given once per month or once every 12 weeks. A once per month injection (only) is used to treat women with endometriosis, dysfunctional uterine bleeding, or advanced breast cancer. This medicine may be used for other purposes; ask your health care provider or pharmacist if you have questions. COMMON BRAND NAME(S): Zoladex What should I tell my health care provider before I take this medicine? They need to know if you have any of these conditions (some only apply to women): -diabetes -heart disease or previous heart attack -high blood pressure -high cholesterol -kidney disease -osteoporosis or low bone density -problems passing urine -spinal cord injury -stroke -tobacco smoker -an unusual or allergic reaction to goserelin, hormone therapy, other medicines, foods, dyes, or preservatives -pregnant or trying to get pregnant -breast-feeding How should I use this medicine? This medicine is for injection under the skin. It is given by a health care professional in a hospital or clinic setting. Men receive this injection once every 4 weeks or once every 12 weeks. Women will only receive the once every 4 weeks injection. Talk to your pediatrician regarding the use of this medicine in children. Special care may be needed. Overdosage: If you think you have taken too much of this medicine contact a poison control center or emergency room at once. NOTE: This medicine is only for you. Do not share this medicine with others. What if I miss a dose? It is important not to miss your dose. Call your doctor or health care professional if you are  unable to keep an appointment. What may interact with this medicine? -female hormones like estrogen -herbal or dietary supplements like black cohosh, chasteberry, or DHEA -female hormones like testosterone -prasterone This list may not describe all possible interactions. Give your health care provider a list of all the medicines, herbs, non-prescription drugs, or dietary supplements you use. Also tell them if you smoke, drink alcohol, or use illegal drugs. Some items may interact with your medicine. What should I watch for while using this medicine? Visit your doctor or health care professional for regular checks on your progress. Your symptoms may appear to get worse during the first weeks of this therapy. Tell your doctor or healthcare professional if your symptoms do not start to get better or if they get worse after this time. Your bones may get weaker if you take this medicine for a long time. If you smoke or frequently drink alcohol you may increase your risk of bone loss. A family history of osteoporosis, chronic use of drugs for seizures (convulsions), or corticosteroids can also increase your risk of bone loss. Talk to your doctor about how to keep your bones strong. This medicine should stop regular monthly menstration in women. Tell your doctor if you continue to [facility]. Women should not become pregnant while taking this medicine or for 12 weeks after stopping this medicine. Women should inform their doctor if they wish to become pregnant or think they might be pregnant. There is a potential for serious side effects to an unborn child. Talk to your health care professional or pharmacist for more information. Do not breast-feed an infant while  taking this medicine. Men should inform their doctors if they wish to father a child. This medicine may lower sperm counts. Talk to your health care professional or pharmacist for more information. What side effects may I notice from receiving this  medicine? Side effects that you should report to your doctor or health care professional as soon as possible: -allergic reactions like skin rash, itching or hives, swelling of the face, lips, or tongue -bone pain -breathing problems -changes in vision -chest pain -feeling faint or lightheaded, falls -fever, chills -pain, swelling, warmth in the leg -pain, tingling, numbness in the hands or feet -signs and symptoms of low blood pressure like dizziness; feeling faint or lightheaded, falls; unusually weak or tired -stomach pain -swelling of the ankles, feet, hands -trouble passing urine or change in the amount of urine -unusually high or low blood pressure -unusually weak or tired Side effects that usually do not require medical attention (report to your doctor or health care professional if they continue or are bothersome): -change in sex drive or performance -changes in breast size in both males and females -changes in emotions or moods -headache -hot flashes -irritation at site where injected -loss of appetite -skin problems like acne, dry skin -vaginal dryness This list may not describe all possible side effects. Call your doctor for medical advice about side effects. You may report side effects to FDA at 1-800-FDA-1088. Where should I keep my medicine? This drug is given in a hospital or clinic and will not be stored at home. NOTE: This sheet is a summary. It may not cover all possible information. If you have questions about this medicine, talk to your doctor, pharmacist, or health care provider.  2018 Elsevier/Gold Standard (2013-12-24 11:10:35)

## 2018-09-26 ENCOUNTER — Ambulatory Visit
Admission: RE | Admit: 2018-09-26 | Discharge: 2018-09-26 | Disposition: A | Discharge status: Home / Self Care | Payer: BLUE CROSS/BLUE SHIELD | Source: Ambulatory Visit | Attending: Radiation Oncology | Admitting: Radiation Oncology

## 2018-09-26 DIAGNOSIS — C50211 Malignant neoplasm of upper-inner quadrant of right female breast: Secondary | ICD-10-CM | POA: Diagnosis not present

## 2018-10-01 ENCOUNTER — Ambulatory Visit: Payer: BLUE CROSS/BLUE SHIELD

## 2018-10-01 ENCOUNTER — Ambulatory Visit
Admission: RE | Admit: 2018-10-01 | Discharge: 2018-10-01 | Disposition: A | Discharge status: Home / Self Care | Payer: BLUE CROSS/BLUE SHIELD | Source: Ambulatory Visit | Attending: Radiation Oncology | Admitting: Radiation Oncology

## 2018-10-01 DIAGNOSIS — C50211 Malignant neoplasm of upper-inner quadrant of right female breast: Secondary | ICD-10-CM | POA: Insufficient documentation

## 2018-10-01 DIAGNOSIS — Z510 Encounter for antineoplastic radiation therapy: Secondary | ICD-10-CM | POA: Diagnosis present

## 2018-10-01 DIAGNOSIS — Z170 Estrogen receptor positive status [ER+]: Secondary | ICD-10-CM | POA: Insufficient documentation

## 2018-10-02 ENCOUNTER — Ambulatory Visit: Payer: BLUE CROSS/BLUE SHIELD

## 2018-10-02 ENCOUNTER — Ambulatory Visit
Admission: RE | Admit: 2018-10-02 | Discharge: 2018-10-02 | Disposition: A | Discharge status: Home / Self Care | Payer: BLUE CROSS/BLUE SHIELD | Source: Ambulatory Visit | Attending: Radiation Oncology | Admitting: Radiation Oncology

## 2018-10-02 DIAGNOSIS — Z170 Estrogen receptor positive status [ER+]: Principal | ICD-10-CM

## 2018-10-02 DIAGNOSIS — C50211 Malignant neoplasm of upper-inner quadrant of right female breast: Secondary | ICD-10-CM | POA: Diagnosis not present

## 2018-10-02 MED ORDER — SONAFINE EX EMUL
1.0000 "application " | Freq: Two times a day (BID) | CUTANEOUS | Status: DC
  Administered 2018-10-02: 1 via TOPICAL

## 2018-10-03 ENCOUNTER — Ambulatory Visit: Payer: BLUE CROSS/BLUE SHIELD

## 2018-10-03 ENCOUNTER — Ambulatory Visit
Admission: RE | Admit: 2018-10-03 | Discharge: 2018-10-03 | Disposition: A | Discharge status: Home / Self Care | Payer: BLUE CROSS/BLUE SHIELD | Source: Ambulatory Visit | Attending: Radiation Oncology | Admitting: Radiation Oncology

## 2018-10-03 DIAGNOSIS — C50211 Malignant neoplasm of upper-inner quadrant of right female breast: Secondary | ICD-10-CM | POA: Diagnosis not present

## 2018-10-04 ENCOUNTER — Ambulatory Visit
Admission: RE | Admit: 2018-10-04 | Discharge: 2018-10-04 | Disposition: A | Discharge status: Home / Self Care | Payer: BLUE CROSS/BLUE SHIELD | Source: Ambulatory Visit | Attending: Radiation Oncology | Admitting: Radiation Oncology

## 2018-10-04 ENCOUNTER — Ambulatory Visit: Payer: BLUE CROSS/BLUE SHIELD

## 2018-10-04 DIAGNOSIS — C50211 Malignant neoplasm of upper-inner quadrant of right female breast: Secondary | ICD-10-CM | POA: Diagnosis not present

## 2018-10-04 NOTE — Progress Notes (Signed)
Return to work form successfully faxed to [employer] at [phone]. Mailed copy to patient address on file.

## 2018-10-05 ENCOUNTER — Ambulatory Visit: Payer: BLUE CROSS/BLUE SHIELD

## 2018-10-05 ENCOUNTER — Ambulatory Visit
Admission: RE | Admit: 2018-10-05 | Discharge: 2018-10-05 | Disposition: A | Discharge status: Home / Self Care | Payer: BLUE CROSS/BLUE SHIELD | Source: Ambulatory Visit | Attending: Radiation Oncology | Admitting: Radiation Oncology

## 2018-10-05 DIAGNOSIS — C50211 Malignant neoplasm of upper-inner quadrant of right female breast: Secondary | ICD-10-CM | POA: Diagnosis not present

## 2018-10-08 ENCOUNTER — Ambulatory Visit: Payer: BLUE CROSS/BLUE SHIELD

## 2018-10-08 ENCOUNTER — Ambulatory Visit
Admission: RE | Admit: 2018-10-08 | Discharge: 2018-10-08 | Disposition: A | Discharge status: Home / Self Care | Payer: BLUE CROSS/BLUE SHIELD | Source: Ambulatory Visit | Attending: Radiation Oncology | Admitting: Radiation Oncology

## 2018-10-08 DIAGNOSIS — C50211 Malignant neoplasm of upper-inner quadrant of right female breast: Secondary | ICD-10-CM | POA: Diagnosis not present

## 2018-10-09 ENCOUNTER — Ambulatory Visit: Payer: BLUE CROSS/BLUE SHIELD

## 2018-10-09 ENCOUNTER — Ambulatory Visit
Admission: RE | Admit: 2018-10-09 | Discharge: 2018-10-09 | Disposition: A | Discharge status: Home / Self Care | Payer: BLUE CROSS/BLUE SHIELD | Source: Ambulatory Visit | Attending: Radiation Oncology | Admitting: Radiation Oncology

## 2018-10-09 ENCOUNTER — Encounter: Payer: Self-pay | Admitting: *Deleted

## 2018-10-09 DIAGNOSIS — C50211 Malignant neoplasm of upper-inner quadrant of right female breast: Secondary | ICD-10-CM | POA: Diagnosis not present

## 2018-10-10 ENCOUNTER — Encounter: Payer: Self-pay | Admitting: Radiation Oncology

## 2018-10-10 NOTE — Progress Notes (Signed)
  Radiation Oncology         [phone]) [phone] ________________________________  Name: Kim Griffin MRN: [id number]  Date: 10/10/2018  DOB: June 12, 1996  End of Treatment Note  Diagnosis:   Malignant neoplasm of upper-inner quadrant of right breast in female, estrogen receptor positive. Stage IIB (cT2, cN1, cM0, G3, ER+, PR+, HER2-) Pathologic staging: ypT0, ypN0, Invasive Ductal Carcinoma, grade 3  Indication for treatment:  Curative       Radiation treatment dates:   1. 08/23/18- 09/25/18       2. 08/23/18 - 10/02/18       3. 10/03/18 - 10/09/18  Site/dose:   1. Right axilla; 1.8 Gy in 25 fractions for a total of 45 Gy           2. Right breast; 1.8 Gy in 28 fractions for a total of 50.4 Gy           3. Lumpectomy Boost; 2 Gy in 5 fractions for a total of 10 Gy  Beams/energy:   1. 3D; 10X, 6X        2. 3D; 6X, 10X         3. 3D; 10X, 6X  Narrative: The patient tolerated radiation treatment relatively well.     Pt denied pain throughout treatments. Towards the end of treatment, pt reported moderate fatigue and breast itching. Pt developed hyperpigmentation, skin peeling and swelling in the right breast. Patient was prescribed Radiaplex and reported using it 2x a day. Overall the pt was without complaints.   Plan: The patient has completed radiation treatment. The patient will return to radiation oncology clinic for routine followup in one month. I advised them to call or return sooner if they have any questions or concerns related to their recovery or treatment.  -----------------------------------  Blair Promise, PhD, MD  This document serves as a record of services personally performed by Gery Pray, MD. It was created on his behalf by Mary-Margaret Loma Messing, a trained medical scribe. The creation of this record is based on the scribe's personal observations and the provider's statements to them. This document has been checked and approved by the attending provider.

## 2018-10-12 ENCOUNTER — Telehealth: Payer: Self-pay | Admitting: *Deleted

## 2018-10-12 NOTE — Telephone Encounter (Signed)
This RN returned call to pt per her VM stating she " I think I started having a period ".  Of note pt is receiving zoladex - last injection 09/25/2018, is on femara and receiving chemotherapy.  Noted pt did not receive for approximately 2 months while undergoing radiation in Sept/ Oct.  Kim Griffin verified her last period was " when I started getting the shots " which was April 2019.  She states bleeding starting light yesterday and has become more heavy today. She is having to change her pad every 3-4 hours with minimal clotting ( she stated 2 clots large marble sized this am ).  This RN discussed above is not unusual due to her age and missed zoladex dates.  Ideally bleeding should lighten up over the weekend.  Discussed concerns of increased menstrual bleeding which she should contact the on call doctor over the weekend.  This RN also reiterated though goal of medical care is to make her ovaries sleep and not function - presently she is having ovarian function and concern that she could become pregnant if she does not use appropriate contraception such as a diaphragm or condoms with spermicide.  Kim Griffin verbalized understanding of above and appreciation of discussion.  This RN asked pt to contact this office on Monday with update.  This note will be given to MD upon his return to the office for his review and any further recommendations.

## 2018-10-23 ENCOUNTER — Inpatient Hospital Stay: Payer: BLUE CROSS/BLUE SHIELD | Attending: Hematology and Oncology

## 2018-10-23 ENCOUNTER — Other Ambulatory Visit: Payer: Self-pay | Admitting: Oncology

## 2018-10-23 VITALS — BP 104/53 | HR 77 | Temp 98.2°F | Resp 18

## 2018-10-23 DIAGNOSIS — Z5111 Encounter for antineoplastic chemotherapy: Secondary | ICD-10-CM | POA: Insufficient documentation

## 2018-10-23 DIAGNOSIS — Z170 Estrogen receptor positive status [ER+]: Secondary | ICD-10-CM | POA: Insufficient documentation

## 2018-10-23 DIAGNOSIS — C50211 Malignant neoplasm of upper-inner quadrant of right female breast: Secondary | ICD-10-CM | POA: Insufficient documentation

## 2018-10-23 DIAGNOSIS — Z95828 Presence of other vascular implants and grafts: Secondary | ICD-10-CM

## 2018-10-23 MED ORDER — GOSERELIN ACETATE 3.6 MG ~~LOC~~ IMPL
DRUG_IMPLANT | SUBCUTANEOUS | Status: AC
  Filled 2018-10-23: qty 3.6

## 2018-10-23 MED ORDER — GOSERELIN ACETATE 3.6 MG ~~LOC~~ IMPL
3.6000 mg | DRUG_IMPLANT | Freq: Once | SUBCUTANEOUS | Status: AC
  Administered 2018-10-23: 3.6 mg via SUBCUTANEOUS

## 2018-11-12 ENCOUNTER — Telehealth: Payer: Self-pay

## 2018-11-12 ENCOUNTER — Other Ambulatory Visit: Payer: Self-pay

## 2018-11-12 ENCOUNTER — Encounter: Payer: Self-pay | Admitting: Radiation Oncology

## 2018-11-12 ENCOUNTER — Ambulatory Visit
Admission: RE | Admit: 2018-11-12 | Discharge: 2018-11-12 | Disposition: A | Discharge status: Home / Self Care | Payer: BLUE CROSS/BLUE SHIELD | Source: Ambulatory Visit | Attending: Radiation Oncology | Admitting: Radiation Oncology

## 2018-11-12 VITALS — BP 131/78 | HR 72 | Temp 97.8°F | Resp 18 | Ht 61.0 in | Wt 130.4 lb

## 2018-11-12 DIAGNOSIS — C50211 Malignant neoplasm of upper-inner quadrant of right female breast: Secondary | ICD-10-CM | POA: Insufficient documentation

## 2018-11-12 DIAGNOSIS — Z170 Estrogen receptor positive status [ER+]: Secondary | ICD-10-CM | POA: Diagnosis not present

## 2018-11-12 DIAGNOSIS — Z923 Personal history of irradiation: Secondary | ICD-10-CM | POA: Insufficient documentation

## 2018-11-12 NOTE — Progress Notes (Signed)
Radiation Oncology         [phone] ________________________________  Name: Kim Griffin MRN: [id number]  Date: 11/12/2018  DOB: 10-22-1996  Follow-Up Visit Note  CC: [facility]  Nicholas Lose, MD  No diagnosis found.  Diagnosis:   Malignant neoplasm of upper-inner quadrant of right breast in female, estrogen receptor positive. Stage IIB (cT2, cN1, cM0, G3, ER+, PR+, HER2-) Pathologic staging:ypT0, ypN0, Invasive Ductal Carcinoma, grade 3  Interval Since Last Radiation:  1 months  Radiation treatment dates:   1. 08/23/18- 09/25/18                                                   2. 08/23/18 - 10/02/18                                                   3. 10/03/18 - 10/09/18  Site/dose:   1. Right axilla; 1.8 Gy in 25 fractions for a total of 45 Gy                      2. Right breast; 1.8 Gy in 28 fractions for a total of 50.4 Gy                      3. Lumpectomy Boost; 2 Gy in 5 fractions for a total of 10 Gy  Narrative:  The patient returns today for routine follow-up.  she is doing well overall. They are accompanied by a female family member. She has started hormonal therapy letrozole.               On review of systems, she reports minor fatigue and some numbness in her right axilla area. she denies pain, cough, swelling in her arm and any other symptoms. Pertinent positives are listed and detailed within the above HPI.                 ALLERGIES:  has No Known Allergies.  Meds: Current Outpatient Medications  Medication Sig Dispense Refill  . ferrous sulfate 325 (65 FE) MG tablet Take 325 mg by mouth daily with breakfast.    . HYDROcodone-acetaminophen (NORCO/VICODIN) 5-325 MG tablet Take 1 tablet by mouth every 6 (six) hours as needed for moderate pain. 20 tablet 0  . letrozole (FEMARA) 2.5 MG tablet Take 1 tablet (2.5 mg total) by mouth daily. 90 tablet 3   No current facility-administered medications for this encounter.     Facility-Administered Medications Ordered in Other Encounters  Medication Dose Route Frequency Provider Last Rate Last Dose  . goserelin (ZOLADEX) injection 3.6 mg  3.6 mg Subcutaneous Once Nicholas Lose, MD        Physical Findings: The patient is in no acute distress. Patient is alert and oriented.  height is _0  (1.549 m) and weight is 130 lb 6.4 oz (59.1 kg). Her oral temperature is 97.8 F (36.6 C). Her blood pressure is 131/78 and her pulse is 72. Her respiration is 18 and oxygen saturation is 99%. .  No significant changes. Lungs are clear to auscultation bilaterally. Heart has regular rate and rhythm. No palpable cervical, supraclavicular, or axillary adenopathy.  Abdomen soft, non-tender, normal bowel sounds. Right breast with hyperpigmentation but skin is well-healed. She continues to have some induration at the lumpectomy site, with no dominant mass, nipple discharge or bleeding.    Lab Findings: Lab Results  Component Value Date   WBC 3.5 (L) 06/12/2018   HGB 10.4 (L) 06/12/2018   HCT 31.7 (L) 06/12/2018   MCV 71.9 (L) 06/12/2018   PLT 337 06/12/2018    Radiographic Findings: No results found.  Impression:  The patient is recovering from the effects of radiation.  No evidence of recurrence on clinical exam. Patient is healing appropriately.   Plan:  Follow-up in radiation oncology in 3 months.   ____________________________________   Blair Promise, PhD, MD    This document serves as a record of services personally performed by Gery Pray, MD. It was created on his behalf by Mary-Margaret Loma Messing, a trained medical scribe. The creation of this record is based on the scribe's personal observations and the provider's statements to them. This document has been checked and approved by the attending provider.

## 2018-11-12 NOTE — Telephone Encounter (Signed)
Left VM for pt re: today's appt with Dr. Sondra Come. Requested return call. Loma Sousa, RN BSN

## 2018-11-12 NOTE — Progress Notes (Signed)
Pt presents today for f/u with Dr. Sondra Come. Pt is accompanied by aunt. Pt reports that fatigue is improved. Pt denies c/o pain. Pt endorses using Vitamin E oil on breast. Breast is slightly hyperpigmented and swollen.   BP 131/78   Pulse 72   Temp 97.8 F (36.6 C) (Oral)   Resp 18   Ht 5\' 1"  (1.549 m)   Wt 130 lb 6.4 oz (59.1 kg)   SpO2 99%   BMI 24.64 kg/m   Wt Readings from Last 3 Encounters:  11/12/18 130 lb 6.4 oz (59.1 kg)  09/24/18 131 lb (59.4 kg)  07/30/18 131 lb 3.2 oz (59.5 kg)   Loma Sousa, RN BSN

## 2018-11-23 ENCOUNTER — Telehealth: Payer: Self-pay | Admitting: Hematology and Oncology

## 2018-11-23 ENCOUNTER — Inpatient Hospital Stay: Payer: BLUE CROSS/BLUE SHIELD

## 2018-11-23 ENCOUNTER — Inpatient Hospital Stay: Payer: BLUE CROSS/BLUE SHIELD | Attending: Hematology and Oncology

## 2018-11-23 DIAGNOSIS — Z170 Estrogen receptor positive status [ER+]: Secondary | ICD-10-CM | POA: Diagnosis not present

## 2018-11-23 DIAGNOSIS — Z5111 Encounter for antineoplastic chemotherapy: Secondary | ICD-10-CM | POA: Diagnosis present

## 2018-11-23 DIAGNOSIS — C50211 Malignant neoplasm of upper-inner quadrant of right female breast: Secondary | ICD-10-CM

## 2018-11-23 DIAGNOSIS — Z95828 Presence of other vascular implants and grafts: Secondary | ICD-10-CM

## 2018-11-23 MED ORDER — GOSERELIN ACETATE 3.6 MG ~~LOC~~ IMPL
3.6000 mg | DRUG_IMPLANT | Freq: Once | SUBCUTANEOUS | Status: AC
  Administered 2018-11-23: 3.6 mg via SUBCUTANEOUS

## 2018-11-23 MED ORDER — GOSERELIN ACETATE 3.6 MG ~~LOC~~ IMPL
DRUG_IMPLANT | SUBCUTANEOUS | Status: AC
  Filled 2018-11-23: qty 3.6

## 2018-11-23 NOTE — Telephone Encounter (Signed)
Rescheduled no show appt for today.  Okay per charge nurse to schedule for 1:30 01/24.

## 2018-12-14 IMAGING — MG MM PLC BREAST LOC DEV 1ST LESION INC*R*
8 of 12 series · 8 of 12 positions shown · non-contrast
Comparison: Previous exam(s).

CLINICAL DATA: 22-year-old female presents for radioactive seed
localization of 2 sites in the right breast, with atypical ductal
hyperplasia and papilloma at site of dumbbell shaped biopsy marking
clip and invasive ductal carcinoma at site of spiral shaped biopsy
marking clip.

EXAM:
MAMMOGRAPHIC GUIDED RADIOACTIVE SEED LOCALIZATION OF THE RIGHT
BREAST

[R ML (1 of 3)]
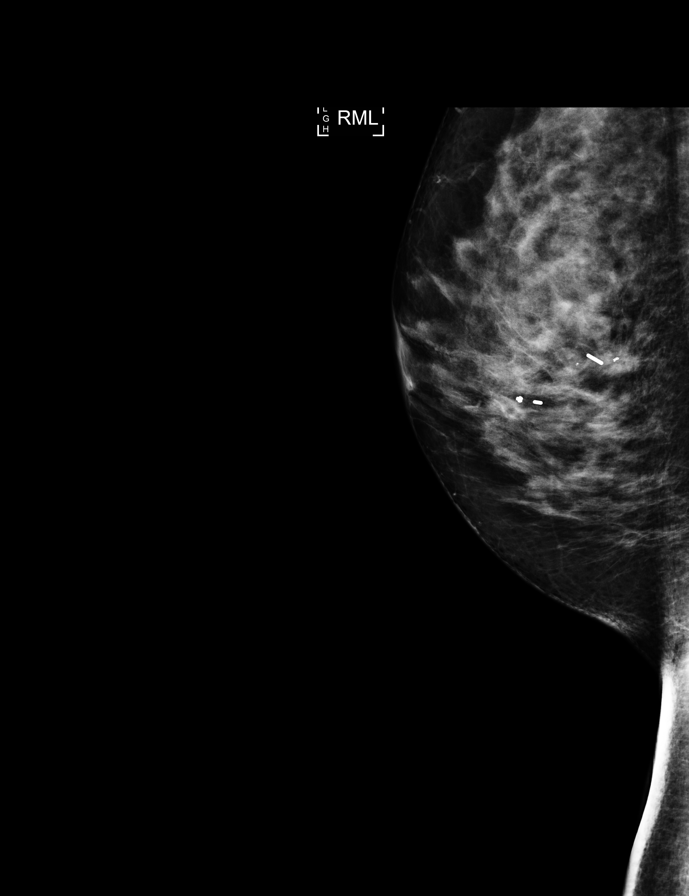

[R CC (1 of 4)]
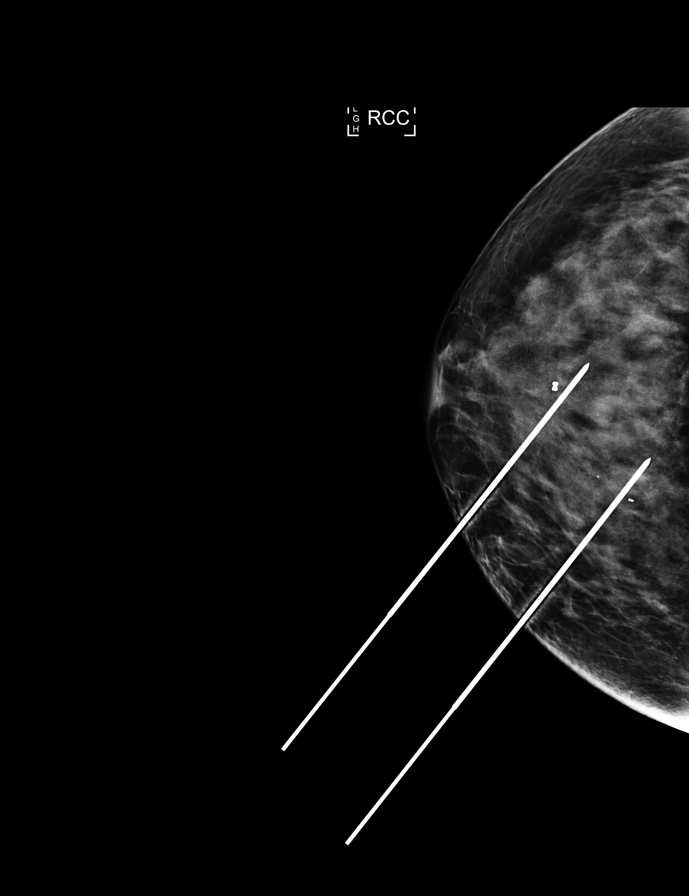

[R ML (2 of 3)]
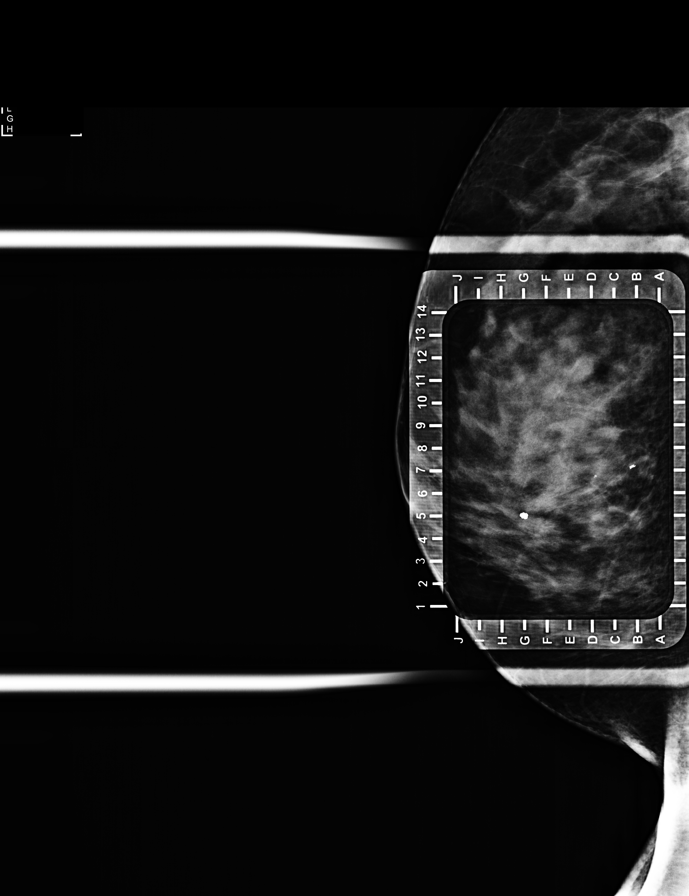

[R CC (2 of 4)]
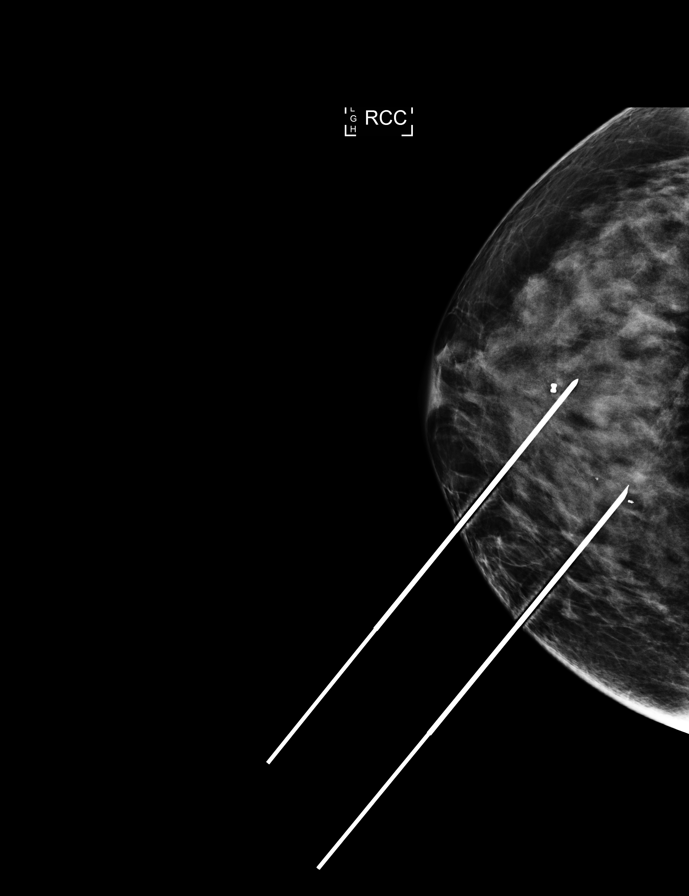

[R CC (3 of 4)]
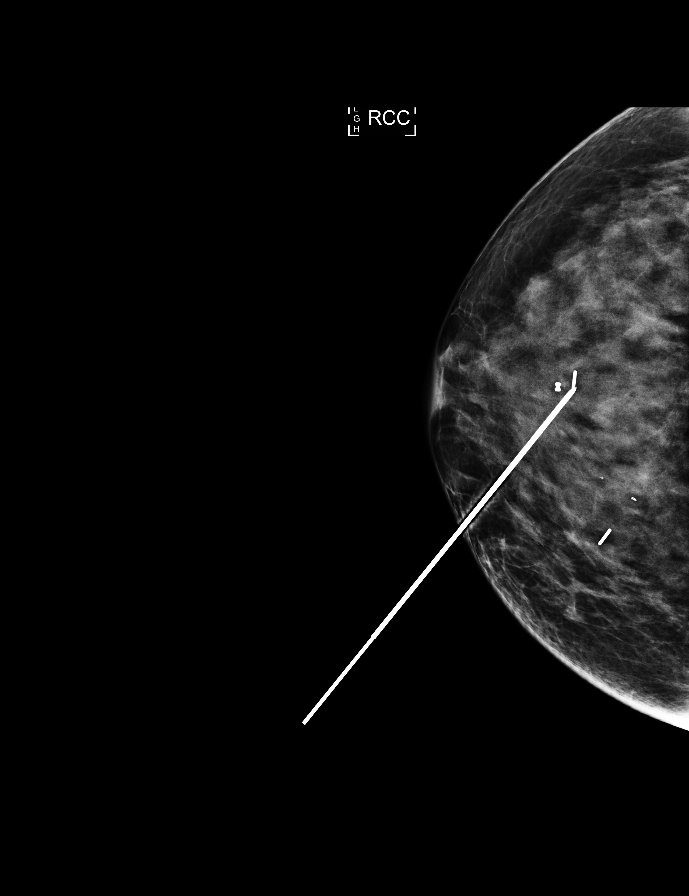

[R ML (3 of 3)]
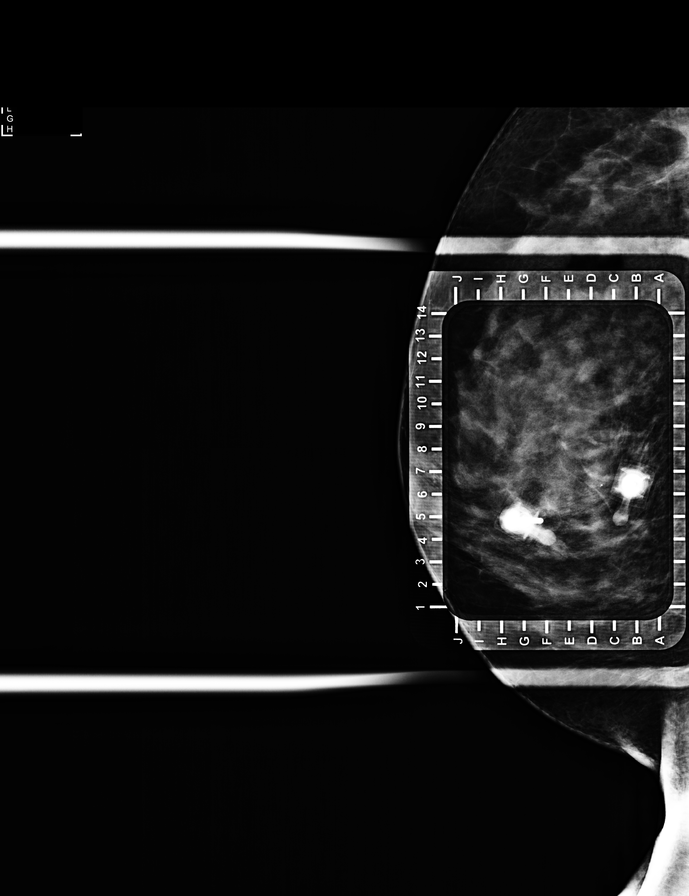

[R MLO]
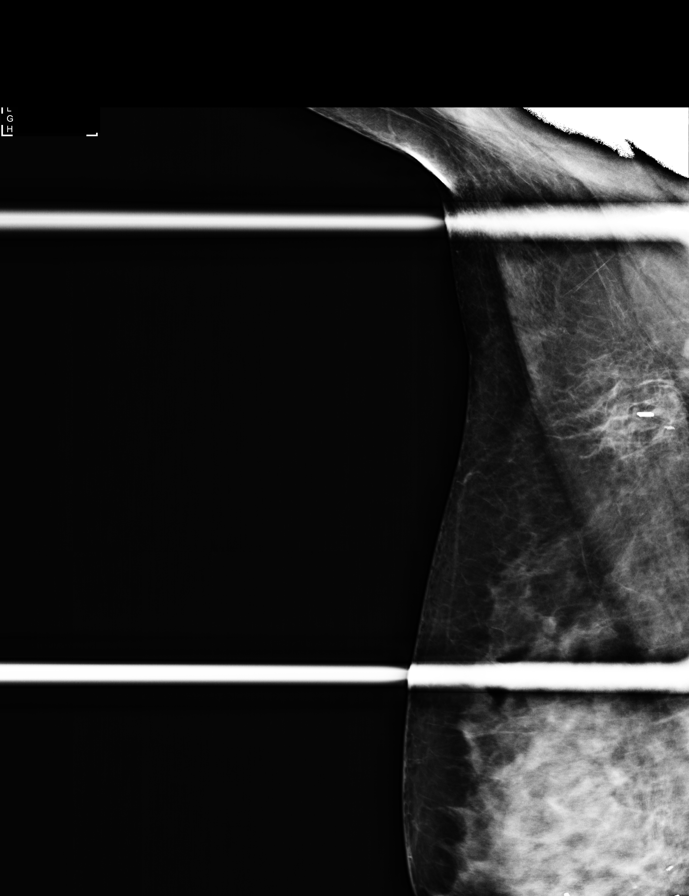

[R CC (4 of 4)]
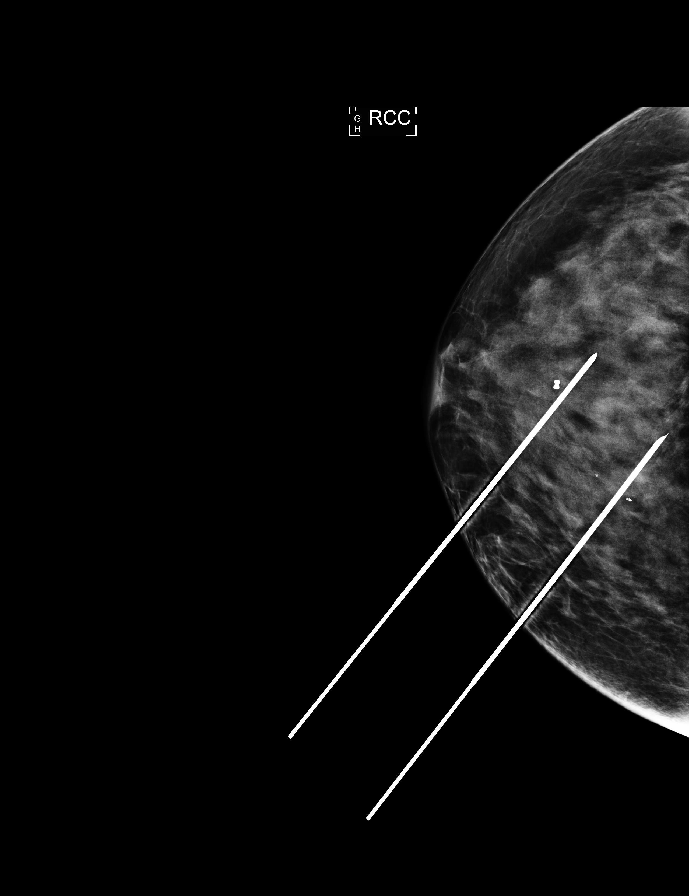

[8 of 12 positions shown; findings below may reference images not displayed]

FINDINGS: Patient presents for radioactive seed localization prior to right
breast lumpectomy. I met with the patient and we discussed the
procedure of seed localization including benefits and alternatives.
We discussed the high likelihood of a successful procedure. We
discussed the risks of the procedure including infection, bleeding,
tissue injury and further surgery. We discussed the low dose of
radioactivity involved in the procedure. Informed, written consent
was given.

The usual time-out protocol was performed immediately prior to the
procedure.

RIGHT BREAST 1 O'CLOCK SPIRAL CLIP: Using mammographic guidance,
sterile technique, 1% lidocaine and an [id number] radioactive seed, the
spiral shaped biopsy marking clip was localized using a medial to
lateral approach. The follow-up mammogram images confirm the seed to
be displaced 1 cm medial to the spiral shaped biopsy marking clip.
This was subsequently discussed with Dr. Anze.

Follow-up survey of the patient confirms presence of the radioactive
seed.

Order number of [id number] seed:  [id number].

Total activity:  0.246 millicuries reference Date: 07/04/2018

RIGHT BREAST DUMBBELL CLIP: Using mammographic guidance, sterile
technique, 1% lidocaine and an [id number] radioactive seed, the dumbbell
shaped biopsy marking clip was localized using a medial to lateral
approach. The follow-up mammogram images confirm the seed in the
expected location and were marked for Dr. Anze.

Follow-up survey of the patient confirms presence of the radioactive
seed.

Order number of [id number] seed:  [id number].

Total activity:  0.246 millicuries reference Date: 07/04/2018

The patient tolerated the procedure well and was released from the
[REDACTED]. She was given instructions regarding seed removal.
IMPRESSION: 1. Radioactive seed localization of spiral clip at site of biopsy
proven malignancy at the 1 o'clock position, with the radioactive
seed displaced approximately 1 cm medial to the clip. This was
discussed and reviewed with Dr. Anze.

2. Radioactive seed localization of dumbbell shaped biopsy marking
clip at site of biopsy-proven ADH and papilloma.

3. Radioactive seed localization right axillary lymph node (this was
dictated separately on the ultrasound-guided needle localization
report).

## 2018-12-25 ENCOUNTER — Inpatient Hospital Stay: Payer: BLUE CROSS/BLUE SHIELD | Attending: Hematology and Oncology

## 2018-12-25 VITALS — BP 117/65 | HR 71 | Temp 98.9°F | Resp 16

## 2018-12-25 DIAGNOSIS — Z95828 Presence of other vascular implants and grafts: Secondary | ICD-10-CM

## 2018-12-25 DIAGNOSIS — Z5111 Encounter for antineoplastic chemotherapy: Secondary | ICD-10-CM | POA: Insufficient documentation

## 2018-12-25 DIAGNOSIS — C50211 Malignant neoplasm of upper-inner quadrant of right female breast: Secondary | ICD-10-CM | POA: Insufficient documentation

## 2018-12-25 DIAGNOSIS — Z170 Estrogen receptor positive status [ER+]: Secondary | ICD-10-CM | POA: Insufficient documentation

## 2018-12-25 MED ORDER — GOSERELIN ACETATE 3.6 MG ~~LOC~~ IMPL
DRUG_IMPLANT | SUBCUTANEOUS | Status: AC
  Filled 2018-12-25: qty 3.6

## 2018-12-25 MED ORDER — GOSERELIN ACETATE 3.6 MG ~~LOC~~ IMPL
3.6000 mg | DRUG_IMPLANT | Freq: Once | SUBCUTANEOUS | Status: AC
  Administered 2018-12-25: 3.6 mg via SUBCUTANEOUS

## 2018-12-26 ENCOUNTER — Telehealth: Payer: Self-pay

## 2018-12-26 NOTE — Telephone Encounter (Signed)
Spoke with patient to remind of SCP visit with NP on 01/01/19 at 2 pm.  Patient said she will come to appt.

## 2019-01-01 ENCOUNTER — Inpatient Hospital Stay: Payer: BLUE CROSS/BLUE SHIELD | Attending: Hematology and Oncology | Admitting: Adult Health

## 2019-01-01 ENCOUNTER — Encounter: Payer: Self-pay | Admitting: Adult Health

## 2019-01-01 ENCOUNTER — Telehealth: Payer: Self-pay | Admitting: Hematology and Oncology

## 2019-01-01 VITALS — BP 113/66 | HR 83 | Temp 98.4°F | Resp 18 | Ht 61.0 in | Wt 131.6 lb

## 2019-01-01 DIAGNOSIS — C50211 Malignant neoplasm of upper-inner quadrant of right female breast: Secondary | ICD-10-CM | POA: Diagnosis not present

## 2019-01-01 DIAGNOSIS — Z170 Estrogen receptor positive status [ER+]: Secondary | ICD-10-CM | POA: Diagnosis not present

## 2019-01-01 DIAGNOSIS — Z5111 Encounter for antineoplastic chemotherapy: Secondary | ICD-10-CM | POA: Insufficient documentation

## 2019-01-01 DIAGNOSIS — E2839 Other primary ovarian failure: Secondary | ICD-10-CM | POA: Diagnosis not present

## 2019-01-01 DIAGNOSIS — Z79811 Long term (current) use of aromatase inhibitors: Secondary | ICD-10-CM | POA: Insufficient documentation

## 2019-01-01 DIAGNOSIS — Z923 Personal history of irradiation: Secondary | ICD-10-CM | POA: Insufficient documentation

## 2019-01-01 NOTE — Progress Notes (Signed)
CLINIC:  Survivorship   REASON FOR VISIT:  Routine follow-up post-treatment for a recent history of breast cancer.  BRIEF ONCOLOGIC HISTORY:    Malignant neoplasm of upper-inner quadrant of right breast in female, estrogen receptor positive ([place])   01/10/2018 Initial Diagnosis    Palpable right breast mass with calcifications medial right breast at 1 o'clock position: 2.6 cm by ultrasound, 1 abnormal lymph node: Ultrasound biopsy of both the mass and the lymph node revealed grade 3 invasive ductal carcinoma ER 90%, PR 2%, Ki-67 70%, HER-2 negative ratio 0.96, T2 N1 stage II a AJCC 8 clinical stage    01/17/2018 Cancer Staging    Staging form: Breast, AJCC 8th Edition - Clinical stage from 01/17/2018: Stage IIB (cT2, cN1, cM0, G3, ER+, PR+, HER2-) - Signed by Gardenia Phlegm, NP on 06/20/2018    01/29/2018 - 06/12/2018 Neo-Adjuvant Chemotherapy    Dose dense Adriamycin and Cytoxan followed by Taxol x12  Zoladex started with chemo    02/27/2018 Miscellaneous    Genetics negative    07/13/2018 Surgery    Right lumpectomy: Pathologic complete response 03 lymph nodes negative    08/23/2018 - 10/09/2018 Radiation Therapy    1. Right axilla; 1.8 Gy in 25 fractions for a total of 45 Gy                      2. Right breast; 1.8 Gy in 28 fractions for a total of 50.4 Gy                      3. Lumpectomy Boost; 2 Gy in 5 fractions for a total of 10 Gy    09/2018 -  Anti-estrogen oral therapy    Letrozole     INTERVAL HISTORY:  Kim Griffin presents to the [facility] today for our initial meeting to review her survivorship care plan detailing her treatment course for breast cancer, as well as monitoring long-term side effects of that treatment, education regarding health maintenance, screening, and overall wellness and health promotion.     Overall, Kim Griffin reports feeling quite well.  She is using vitamin e oil on her breast and it is doing well.  She is taking Letrozole  daily and tolerating it well.  She is receiving Zoladex injections as well.  She is tolerating these well.    Latorya is back at work.  She works at [employer] in [employer].     REVIEW OF SYSTEMS:  Review of Systems  Constitutional: Negative for appetite change, chills, fatigue, fever and unexpected weight change.  HENT:   Negative for hearing loss, lump/mass, sore throat and trouble swallowing.   Eyes: Negative for eye problems and icterus.  Respiratory: Negative for chest tightness, cough and shortness of breath.   Cardiovascular: Negative for chest pain, leg swelling and palpitations.  Gastrointestinal: Negative for abdominal distention, abdominal pain, constipation, diarrhea, nausea and vomiting.  Endocrine: Negative for hot flashes.  Genitourinary: Negative for difficulty urinating.   Musculoskeletal: Negative for arthralgias.  Skin: Negative for itching and rash.  Neurological: Negative for dizziness, extremity weakness, headaches and numbness.  Hematological: Negative for adenopathy. Does not bruise/bleed easily.  Psychiatric/Behavioral: Negative for depression. The patient is not nervous/anxious.    Breast: Denies any new nodularity, masses, tenderness, nipple changes, or nipple discharge.      ONCOLOGY TREATMENT TEAM:  1. Surgeon:  Dr. Lucia Gaskins at [facility] 2. Medical Oncologist: Dr. Lindi Adie  3.  Radiation Oncologist: Dr. Sondra Come    PAST MEDICAL/SURGICAL HISTORY:  Past Medical History:  Diagnosis Date  . Anemia    takes iron supplement  . Breast cancer, right ([place]) 12/2017  . History of chemotherapy    finished chemo 06/12/2018  . Sickle cell trait [facility])    Past Surgical History:  Procedure Laterality Date  . BREAST LUMPECTOMY WITH RADIOACTIVE SEED AND SENTINEL LYMPH NODE BIOPSY Right 07/13/2018   Procedure: RIGHT BREAST LUMPECTOMY X 2 WITH RADIOACTIVE SEED X 2, RIGHT SEED TARGETED AXILLARY LYMPH NODE EXCISION AND RIGHT SENTINEL LYMPH NODE  BIOPSY;  Surgeon: Alphonsa Overall, MD;  Location: [place];  Service: General;  Laterality: Right;  . PORT-A-CATH REMOVAL Right 07/13/2018   Procedure: REMOVAL PORT-A-CATH;  Surgeon: Alphonsa Overall, MD;  Location: [place];  Service: General;  Laterality: Right;  . PORTACATH PLACEMENT N/A 01/22/2018   Procedure: INSERTION PORT-A-CATH WITH ULTRASOUND ERAS PATHWAY;  Surgeon: Alphonsa Overall, MD;  Location: [place];  Service: General;  Laterality: N/A;  . WISDOM TOOTH EXTRACTION       ALLERGIES:  No Known Allergies   CURRENT MEDICATIONS:  Outpatient Encounter Medications as of 01/01/2019  Medication Sig  . ferrous sulfate 325 (65 FE) MG tablet Take 325 mg by mouth daily with breakfast.  . HYDROcodone-acetaminophen (NORCO/VICODIN) 5-325 MG tablet Take 1 tablet by mouth every 6 (six) hours as needed for moderate pain.  Marland Kitchen letrozole (FEMARA) 2.5 MG tablet Take 1 tablet (2.5 mg total) by mouth daily.   Facility-Administered Encounter Medications as of 01/01/2019  Medication  . goserelin (ZOLADEX) injection 3.6 mg     ONCOLOGIC FAMILY HISTORY:  Family History  Problem Relation Age of Onset  . Breast cancer Other 44       maternal grandmother's sister's daughter; currently 109  . Lung cancer Other        2 of maternal grandmother's siblings     GENETIC COUNSELING/TESTING: negative  SOCIAL HISTORY:  Social History   Socioeconomic History  . Marital status: Single    Spouse name: Not on file  . Number of children: Not on file  . Years of education: Not on file  . Highest education level: Not on file  Occupational History  . Not on file  Social Needs  . Financial resource strain: Not on file  . Food insecurity:    Worry: Not on file    Inability: Not on file  . Transportation needs:    Medical: Not on file    Non-medical: Not on file  Tobacco Use  . Smoking status: Never Smoker  . Smokeless tobacco: Never Used  Substance and Sexual Activity  .  Alcohol use: No    Frequency: Never  . Drug use: No  . Sexual activity: Not on file  Lifestyle  . Physical activity:    Days per week: Not on file    Minutes per session: Not on file  . Stress: Not on file  Relationships  . Social connections:    Talks on phone: Not on file    Gets together: Not on file    Attends religious service: Not on file    Active member of club or organization: Not on file    Attends meetings of clubs or organizations: Not on file    Relationship status: Not on file  . Intimate partner violence:    Fear of current or ex partner: Not on file    Emotionally abused: Not on file  Physically abused: Not on file    Forced sexual activity: Not on file  Other Topics Concern  . Not on file  Social History Narrative  . Not on file       PHYSICAL EXAMINATION:  Vital Signs:   Vitals:   01/01/19 1348  BP: 113/66  Pulse: 83  Resp: 18  Temp: 98.4 F (36.9 C)  SpO2: 100%   Filed Weights   01/01/19 1348  Weight: 131 lb 9.6 oz (59.7 kg)   General: Well-nourished, well-appearing female in no acute distress.  She is unaccompanied today.   HEENT: Head is normocephalic.  Pupils equal and reactive to light. Conjunctivae clear without exudate.  Sclerae anicteric. Oral mucosa is pink, moist.  Oropharynx is pink without lesions or erythema.  Lymph: No cervical, supraclavicular, or infraclavicular lymphadenopathy noted on palpation.  Cardiovascular: Regular rate and rhythm.Marland Kitchen Respiratory: Clear to auscultation bilaterally. Chest expansion symmetric; breathing non-labored.  Breasts: right breast s/p lumpectomy and radiation.  There is a small area about the size of a nickel that is firm underneath her lumpectomy site.  ? Seroma, left breast benign GI: Abdomen soft and round; non-tender, non-distended. Bowel sounds normoactive.  GU: Deferred.  Neuro: No focal deficits. Steady gait.  Psych: Mood and affect normal and appropriate for situation.  Extremities: No  edema. MSK: No focal spinal tenderness to palpation.  Full range of motion in bilateral upper extremities Skin: Warm and dry.  LABORATORY DATA:  None for this visit.  DIAGNOSTIC IMAGING:  None for this visit.      ASSESSMENT AND PLAN:  Ms.. Schamberger is a pleasant 23 y.o. female with Stage IIB right breast invasive ductal carcinoma, ER+/PR+/HER2-, diagnosed in 12/2017, treated with neoadjuvant chemotherapy, lumpectomy, adjuvant radiation therapy, and anti-estrogen therapy with Letrozole beginning in 09/2018.  She presents to the Survivorship Clinic for our initial meeting and routine follow-up post-completion of treatment for breast cancer.    1. Stage IIB right breast cancer:  Kim Griffin is continuing to recover from definitive treatment for breast cancer. She will follow-up with her medical oncologist, Dr. Lindi Adie 6 months with history and physical exam per surveillance protocol.  She will continue her anti-estrogen therapy with Letrozole and continue to receive Zoladex.  In reviewing her breast MRIs, I noted she has breast density category D.  I counseled her that she would benefit from adjunct breast MRI with mammograms for surveillance.  Today, a comprehensive survivorship care plan and treatment summary was reviewed with the patient today detailing her breast cancer diagnosis, treatment course, potential late/long-term effects of treatment, appropriate follow-up care with recommendations for the future, and patient education resources.  A copy of this summary, along with a letter will be sent to the patient's primary care provider via mail/fax/In Basket message after today's visit.    2. Bone health:  Given Kim Griffin age/history of breast cancer and her current treatment regimen including anti-estrogen therapy with  Letrozole, she is at risk for bone demineralization.  I ordered this to be done.  She was given education on specific activities to promote bone health.  3. Cancer screening:  Due to  Kim Griffin's history and her age, she should receive screening for skin cancers, and gynecologic cancers.  The information and recommendations are listed on the patient's comprehensive care plan/treatment summary and were reviewed in detail with the patient.    4. Health maintenance and wellness promotion: Kim Griffin was encouraged to consume 5-7 servings of fruits and vegetables per day. We  reviewed the "Nutrition Rainbow" handout, as well as the handout "Take Control of Your Health and Reduce Your Cancer Risk" from the [place].  She was also encouraged to engage in moderate to vigorous exercise for 30 minutes per day most days of the week. We discussed the [employer] fitness program, which is designed for cancer survivors to help them become more physically fit after cancer treatments.  She was instructed to limit her alcohol consumption and continue to abstain from tobacco use.     5. Support services/counseling: It is not uncommon for this period of the patient's cancer care trajectory to be one of many emotions and stressors.  We discussed an opportunity for her to participate in the next session of [facility] ("Finding Your New Normal") support group series designed for patients after they have completed treatment.   Kim Griffin was encouraged to take advantage of our many other support services programs, support groups, and/or counseling in coping with her new life as a cancer survivor after completing anti-cancer treatment.  She was offered support today through active listening and expressive supportive counseling.  She was given information regarding our available services and encouraged to contact me with any questions or for help enrolling in any of our support group/programs.    Dispo:   -Return to cancer center in 6 months for f/u with Dr. Lindi Adie  -Mammogram due -Bone density due -Follow up with Dr. Lucia Gaskins  -She is welcome to return back to the Survivorship Clinic at any time; no  additional follow-up needed at this time.  -Consider referral back to survivorship as a long-term survivor for continued surveillance  A total of (30) minutes of face-to-face time was spent with this patient with greater than 50% of that time in counseling and care-coordination.   Gardenia Phlegm, NP Survivorship [facility] [place] [phone]   Note: [place], [place] [facility] [phone]

## 2019-01-01 NOTE — Telephone Encounter (Signed)
Gave avs and calendar ° °

## 2019-01-22 ENCOUNTER — Ambulatory Visit: Payer: Medicaid Other

## 2019-01-22 ENCOUNTER — Inpatient Hospital Stay: Payer: BLUE CROSS/BLUE SHIELD

## 2019-01-22 ENCOUNTER — Other Ambulatory Visit: Payer: Self-pay

## 2019-01-22 ENCOUNTER — Ambulatory Visit
Admission: RE | Admit: 2019-01-22 | Discharge: 2019-01-22 | Disposition: A | Discharge status: Home / Self Care | Payer: BLUE CROSS/BLUE SHIELD | Source: Ambulatory Visit | Attending: Adult Health | Admitting: Adult Health

## 2019-01-22 VITALS — BP 115/65 | HR 82 | Temp 98.6°F | Resp 16

## 2019-01-22 DIAGNOSIS — Z170 Estrogen receptor positive status [ER+]: Principal | ICD-10-CM

## 2019-01-22 DIAGNOSIS — C50211 Malignant neoplasm of upper-inner quadrant of right female breast: Secondary | ICD-10-CM

## 2019-01-22 DIAGNOSIS — Z95828 Presence of other vascular implants and grafts: Secondary | ICD-10-CM

## 2019-01-22 MED ORDER — GOSERELIN ACETATE 3.6 MG ~~LOC~~ IMPL
DRUG_IMPLANT | SUBCUTANEOUS | Status: AC
  Filled 2019-01-22: qty 3.6

## 2019-01-22 MED ORDER — GOSERELIN ACETATE 3.6 MG ~~LOC~~ IMPL
3.6000 mg | DRUG_IMPLANT | Freq: Once | SUBCUTANEOUS | Status: AC
  Administered 2019-01-22: 3.6 mg via SUBCUTANEOUS

## 2019-01-23 ENCOUNTER — Telehealth: Payer: Self-pay | Admitting: *Deleted

## 2019-01-23 NOTE — Telephone Encounter (Signed)
Received a call from pt stating she wanted Dr. Lindi Adie to fill out paperwork for her to self quarantine herself from covid 19 and not go to work.  I talked with the pt and let her know that we can not give her any type of work excuse.  Her chemotherapy treatment ended in September 2019 and at this time she is not undergoing current treatment and is not immunocompromised and can return to work and take general precautions with washing her hands and avoid touching her face.  Pt verbalized understanding.

## 2019-01-25 ENCOUNTER — Other Ambulatory Visit: Payer: Self-pay | Admitting: Adult Health

## 2019-01-25 DIAGNOSIS — Z1231 Encounter for screening mammogram for malignant neoplasm of breast: Secondary | ICD-10-CM

## 2019-01-25 NOTE — Progress Notes (Signed)
Reviewed Mammogram with patient, and that it showed no evidence of malignancy, but that she had dense breasts, with breast density category D.  Recommended breast MRI in 6 months.  Patient agreeable, orders placed.  She knows that if her copay is too high, that we can order an abbreviated MRI for $400 and she will call me to have the orders changed.  Wilber Bihari, NP

## 2019-02-07 ENCOUNTER — Telehealth: Payer: Self-pay

## 2019-02-07 NOTE — Telephone Encounter (Signed)
Contacted pt to discuss if pt would like to have routine f/u appt moved to June/July due to Covid 19. Pt verbalized agreement. Conveyed to pt that scheduler would contact her with new appt. Pt verbalized understanding and agreement. Loma Sousa, RN BSN

## 2019-02-11 ENCOUNTER — Ambulatory Visit: Payer: Medicaid Other | Admitting: Radiation Oncology

## 2019-02-19 ENCOUNTER — Inpatient Hospital Stay: Payer: Medicaid Other | Attending: Hematology and Oncology

## 2019-02-19 ENCOUNTER — Other Ambulatory Visit: Payer: Self-pay

## 2019-02-19 VITALS — BP 114/68 | HR 88 | Temp 97.7°F | Resp 16

## 2019-02-19 DIAGNOSIS — Z79811 Long term (current) use of aromatase inhibitors: Secondary | ICD-10-CM | POA: Insufficient documentation

## 2019-02-19 DIAGNOSIS — Z5111 Encounter for antineoplastic chemotherapy: Secondary | ICD-10-CM | POA: Insufficient documentation

## 2019-02-19 DIAGNOSIS — Z170 Estrogen receptor positive status [ER+]: Secondary | ICD-10-CM | POA: Insufficient documentation

## 2019-02-19 DIAGNOSIS — C50211 Malignant neoplasm of upper-inner quadrant of right female breast: Secondary | ICD-10-CM

## 2019-02-19 DIAGNOSIS — Z95828 Presence of other vascular implants and grafts: Secondary | ICD-10-CM

## 2019-02-19 MED ORDER — GOSERELIN ACETATE 3.6 MG ~~LOC~~ IMPL
3.6000 mg | DRUG_IMPLANT | Freq: Once | SUBCUTANEOUS | Status: AC
  Administered 2019-02-19: 3.6 mg via SUBCUTANEOUS

## 2019-02-19 MED ORDER — GOSERELIN ACETATE 3.6 MG ~~LOC~~ IMPL
DRUG_IMPLANT | SUBCUTANEOUS | Status: AC
  Filled 2019-02-19: qty 3.6

## 2019-03-19 ENCOUNTER — Other Ambulatory Visit: Payer: Self-pay

## 2019-03-19 ENCOUNTER — Inpatient Hospital Stay: Payer: Self-pay | Attending: Hematology and Oncology

## 2019-03-19 DIAGNOSIS — Z5111 Encounter for antineoplastic chemotherapy: Secondary | ICD-10-CM | POA: Insufficient documentation

## 2019-03-19 DIAGNOSIS — Z803 Family history of malignant neoplasm of breast: Secondary | ICD-10-CM | POA: Insufficient documentation

## 2019-03-19 DIAGNOSIS — Z801 Family history of malignant neoplasm of trachea, bronchus and lung: Secondary | ICD-10-CM | POA: Insufficient documentation

## 2019-03-19 DIAGNOSIS — Z923 Personal history of irradiation: Secondary | ICD-10-CM | POA: Insufficient documentation

## 2019-03-19 DIAGNOSIS — Z9221 Personal history of antineoplastic chemotherapy: Secondary | ICD-10-CM | POA: Insufficient documentation

## 2019-03-19 DIAGNOSIS — C50211 Malignant neoplasm of upper-inner quadrant of right female breast: Secondary | ICD-10-CM | POA: Insufficient documentation

## 2019-03-19 DIAGNOSIS — Z170 Estrogen receptor positive status [ER+]: Secondary | ICD-10-CM | POA: Insufficient documentation

## 2019-03-19 DIAGNOSIS — Z95828 Presence of other vascular implants and grafts: Secondary | ICD-10-CM

## 2019-03-19 MED ORDER — GOSERELIN ACETATE 3.6 MG ~~LOC~~ IMPL
DRUG_IMPLANT | SUBCUTANEOUS | Status: AC
  Filled 2019-03-19: qty 3.6

## 2019-03-19 MED ORDER — GOSERELIN ACETATE 3.6 MG ~~LOC~~ IMPL
3.6000 mg | DRUG_IMPLANT | Freq: Once | SUBCUTANEOUS | Status: AC
  Administered 2019-03-19: 3.6 mg via SUBCUTANEOUS

## 2019-03-19 NOTE — Patient Instructions (Signed)
Goserelin injection What is this medicine? GOSERELIN (GOE se rel in) is similar to a hormone found in the body. It lowers the amount of sex hormones that the body makes. Men will have lower testosterone levels and women will have lower estrogen levels while taking this medicine. In men, this medicine is used to treat prostate cancer; the injection is either given once per month or once every 12 weeks. A once per month injection (only) is used to treat women with endometriosis, dysfunctional uterine bleeding, or advanced breast cancer. This medicine may be used for other purposes; ask your health care provider or pharmacist if you have questions. COMMON BRAND NAME(S): Zoladex What should I tell my health care provider before I take this medicine? They need to know if you have any of these conditions (some only apply to women): -diabetes -heart disease or previous heart attack -high blood pressure -high cholesterol -kidney disease -osteoporosis or low bone density -problems passing urine -spinal cord injury -stroke -tobacco smoker -an unusual or allergic reaction to goserelin, hormone therapy, other medicines, foods, dyes, or preservatives -pregnant or trying to get pregnant -breast-feeding How should I use this medicine? This medicine is for injection under the skin. It is given by a health care professional in a hospital or clinic setting. Men receive this injection once every 4 weeks or once every 12 weeks. Women will only receive the once every 4 weeks injection. Talk to your pediatrician regarding the use of this medicine in children. Special care may be needed. Overdosage: If you think you have taken too much of this medicine contact a poison control center or emergency room at once. NOTE: This medicine is only for you. Do not share this medicine with others. What if I miss a dose? It is important not to miss your dose. Call your doctor or health care professional if you are unable to  keep an appointment. What may interact with this medicine? -female hormones like estrogen -herbal or dietary supplements like black cohosh, chasteberry, or DHEA -female hormones like testosterone -prasterone This list may not describe all possible interactions. Give your health care provider a list of all the medicines, herbs, non-prescription drugs, or dietary supplements you use. Also tell them if you smoke, drink alcohol, or use illegal drugs. Some items may interact with your medicine. What should I watch for while using this medicine? Visit your doctor or health care professional for regular checks on your progress. Your symptoms may appear to get worse during the first weeks of this therapy. Tell your doctor or healthcare professional if your symptoms do not start to get better or if they get worse after this time. Your bones may get weaker if you take this medicine for a long time. If you smoke or frequently drink alcohol you may increase your risk of bone loss. A family history of osteoporosis, chronic use of drugs for seizures (convulsions), or corticosteroids can also increase your risk of bone loss. Talk to your doctor about how to keep your bones strong. This medicine should stop regular monthly menstration in women. Tell your doctor if you continue to menstrate. Women should not become pregnant while taking this medicine or for 12 weeks after stopping this medicine. Women should inform their doctor if they wish to become pregnant or think they might be pregnant. There is a potential for serious side effects to an unborn child. Talk to your health care professional or pharmacist for more information. Do not breast-feed an infant while taking   this medicine. Men should inform their doctors if they wish to father a child. This medicine may lower sperm counts. Talk to your health care professional or pharmacist for more information. What side effects may I notice from receiving this  medicine? Side effects that you should report to your doctor or health care professional as soon as possible: -allergic reactions like skin rash, itching or hives, swelling of the face, lips, or tongue -bone pain -breathing problems -changes in vision -chest pain -feeling faint or lightheaded, falls -fever, chills -pain, swelling, warmth in the leg -pain, tingling, numbness in the hands or feet -signs and symptoms of low blood pressure like dizziness; feeling faint or lightheaded, falls; unusually weak or tired -stomach pain -swelling of the ankles, feet, hands -trouble passing urine or change in the amount of urine -unusually high or low blood pressure -unusually weak or tired Side effects that usually do not require medical attention (report to your doctor or health care professional if they continue or are bothersome): -change in sex drive or performance -changes in breast size in both males and females -changes in emotions or moods -headache -hot flashes -irritation at site where injected -loss of appetite -skin problems like acne, dry skin -vaginal dryness This list may not describe all possible side effects. Call your doctor for medical advice about side effects. You may report side effects to FDA at 1-800-FDA-1088. Where should I keep my medicine? This drug is given in a hospital or clinic and will not be stored at home. NOTE: This sheet is a summary. It may not cover all possible information. If you have questions about this medicine, talk to your doctor, pharmacist, or health care provider.  2019 Elsevier/Gold Standard (2013-12-24 11:10:35)  

## 2019-04-16 ENCOUNTER — Other Ambulatory Visit: Payer: Self-pay

## 2019-04-16 ENCOUNTER — Inpatient Hospital Stay: Payer: Self-pay | Attending: Hematology and Oncology

## 2019-04-16 VITALS — BP 110/66 | HR 78 | Temp 98.0°F | Resp 16

## 2019-04-16 DIAGNOSIS — Z79899 Other long term (current) drug therapy: Secondary | ICD-10-CM | POA: Insufficient documentation

## 2019-04-16 DIAGNOSIS — Z923 Personal history of irradiation: Secondary | ICD-10-CM | POA: Insufficient documentation

## 2019-04-16 DIAGNOSIS — Z9221 Personal history of antineoplastic chemotherapy: Secondary | ICD-10-CM | POA: Insufficient documentation

## 2019-04-16 DIAGNOSIS — D573 Sickle-cell trait: Secondary | ICD-10-CM | POA: Insufficient documentation

## 2019-04-16 DIAGNOSIS — Z95828 Presence of other vascular implants and grafts: Secondary | ICD-10-CM

## 2019-04-16 DIAGNOSIS — Z801 Family history of malignant neoplasm of trachea, bronchus and lung: Secondary | ICD-10-CM | POA: Insufficient documentation

## 2019-04-16 DIAGNOSIS — Z5111 Encounter for antineoplastic chemotherapy: Secondary | ICD-10-CM | POA: Insufficient documentation

## 2019-04-16 DIAGNOSIS — C50211 Malignant neoplasm of upper-inner quadrant of right female breast: Secondary | ICD-10-CM | POA: Insufficient documentation

## 2019-04-16 DIAGNOSIS — Z803 Family history of malignant neoplasm of breast: Secondary | ICD-10-CM | POA: Insufficient documentation

## 2019-04-16 DIAGNOSIS — Z170 Estrogen receptor positive status [ER+]: Secondary | ICD-10-CM | POA: Insufficient documentation

## 2019-04-16 MED ORDER — GOSERELIN ACETATE 3.6 MG ~~LOC~~ IMPL
3.6000 mg | DRUG_IMPLANT | Freq: Once | SUBCUTANEOUS | Status: AC
  Administered 2019-04-16: 3.6 mg via SUBCUTANEOUS

## 2019-04-16 NOTE — Patient Instructions (Signed)
Goserelin injection What is this medicine? GOSERELIN (GOE se rel in) is similar to a hormone found in the body. It lowers the amount of sex hormones that the body makes. Men will have lower testosterone levels and women will have lower estrogen levels while taking this medicine. In men, this medicine is used to treat prostate cancer; the injection is either given once per month or once every 12 weeks. A once per month injection (only) is used to treat women with endometriosis, dysfunctional uterine bleeding, or advanced breast cancer. This medicine may be used for other purposes; ask your health care provider or pharmacist if you have questions. COMMON BRAND NAME(S): Zoladex What should I tell my health care provider before I take this medicine? They need to know if you have any of these conditions (some only apply to women): -diabetes -heart disease or previous heart attack -high blood pressure -high cholesterol -kidney disease -osteoporosis or low bone density -problems passing urine -spinal cord injury -stroke -tobacco smoker -an unusual or allergic reaction to goserelin, hormone therapy, other medicines, foods, dyes, or preservatives -pregnant or trying to get pregnant -breast-feeding How should I use this medicine? This medicine is for injection under the skin. It is given by a health care professional in a hospital or clinic setting. Men receive this injection once every 4 weeks or once every 12 weeks. Women will only receive the once every 4 weeks injection. Talk to your pediatrician regarding the use of this medicine in children. Special care may be needed. Overdosage: If you think you have taken too much of this medicine contact a poison control center or emergency room at once. NOTE: This medicine is only for you. Do not share this medicine with others. What if I miss a dose? It is important not to miss your dose. Call your doctor or health care professional if you are unable to  keep an appointment. What may interact with this medicine? -female hormones like estrogen -herbal or dietary supplements like black cohosh, chasteberry, or DHEA -female hormones like testosterone -prasterone This list may not describe all possible interactions. Give your health care provider a list of all the medicines, herbs, non-prescription drugs, or dietary supplements you use. Also tell them if you smoke, drink alcohol, or use illegal drugs. Some items may interact with your medicine. What should I watch for while using this medicine? Visit your doctor or health care professional for regular checks on your progress. Your symptoms may appear to get worse during the first weeks of this therapy. Tell your doctor or healthcare professional if your symptoms do not start to get better or if they get worse after this time. Your bones may get weaker if you take this medicine for a long time. If you smoke or frequently drink alcohol you may increase your risk of bone loss. A family history of osteoporosis, chronic use of drugs for seizures (convulsions), or corticosteroids can also increase your risk of bone loss. Talk to your doctor about how to keep your bones strong. This medicine should stop regular monthly menstration in women. Tell your doctor if you continue to menstrate. Women should not become pregnant while taking this medicine or for 12 weeks after stopping this medicine. Women should inform their doctor if they wish to become pregnant or think they might be pregnant. There is a potential for serious side effects to an unborn child. Talk to your health care professional or pharmacist for more information. Do not breast-feed an infant while taking   this medicine. Men should inform their doctors if they wish to father a child. This medicine may lower sperm counts. Talk to your health care professional or pharmacist for more information. What side effects may I notice from receiving this  medicine? Side effects that you should report to your doctor or health care professional as soon as possible: -allergic reactions like skin rash, itching or hives, swelling of the face, lips, or tongue -bone pain -breathing problems -changes in vision -chest pain -feeling faint or lightheaded, falls -fever, chills -pain, swelling, warmth in the leg -pain, tingling, numbness in the hands or feet -signs and symptoms of low blood pressure like dizziness; feeling faint or lightheaded, falls; unusually weak or tired -stomach pain -swelling of the ankles, feet, hands -trouble passing urine or change in the amount of urine -unusually high or low blood pressure -unusually weak or tired Side effects that usually do not require medical attention (report to your doctor or health care professional if they continue or are bothersome): -change in sex drive or performance -changes in breast size in both males and females -changes in emotions or moods -headache -hot flashes -irritation at site where injected -loss of appetite -skin problems like acne, dry skin -vaginal dryness This list may not describe all possible side effects. Call your doctor for medical advice about side effects. You may report side effects to FDA at 1-800-FDA-1088. Where should I keep my medicine? This drug is given in a hospital or clinic and will not be stored at home. NOTE: This sheet is a summary. It may not cover all possible information. If you have questions about this medicine, talk to your doctor, pharmacist, or health care provider.  2019 Elsevier/Gold Standard (2013-12-24 11:10:35)  

## 2019-05-14 ENCOUNTER — Other Ambulatory Visit: Payer: Self-pay

## 2019-05-14 ENCOUNTER — Inpatient Hospital Stay: Payer: Medicaid Other | Attending: Hematology and Oncology

## 2019-05-14 VITALS — BP 118/70 | HR 80 | Temp 98.5°F | Resp 18

## 2019-05-14 DIAGNOSIS — Z79899 Other long term (current) drug therapy: Secondary | ICD-10-CM | POA: Insufficient documentation

## 2019-05-14 DIAGNOSIS — Z9221 Personal history of antineoplastic chemotherapy: Secondary | ICD-10-CM | POA: Insufficient documentation

## 2019-05-14 DIAGNOSIS — Z170 Estrogen receptor positive status [ER+]: Secondary | ICD-10-CM | POA: Insufficient documentation

## 2019-05-14 DIAGNOSIS — C50211 Malignant neoplasm of upper-inner quadrant of right female breast: Secondary | ICD-10-CM

## 2019-05-14 DIAGNOSIS — Z801 Family history of malignant neoplasm of trachea, bronchus and lung: Secondary | ICD-10-CM | POA: Insufficient documentation

## 2019-05-14 DIAGNOSIS — Z803 Family history of malignant neoplasm of breast: Secondary | ICD-10-CM | POA: Insufficient documentation

## 2019-05-14 DIAGNOSIS — Z5111 Encounter for antineoplastic chemotherapy: Secondary | ICD-10-CM | POA: Insufficient documentation

## 2019-05-14 DIAGNOSIS — Z95828 Presence of other vascular implants and grafts: Secondary | ICD-10-CM

## 2019-05-14 MED ORDER — GOSERELIN ACETATE 3.6 MG ~~LOC~~ IMPL
3.6000 mg | DRUG_IMPLANT | Freq: Once | SUBCUTANEOUS | Status: AC
  Administered 2019-05-14: 3.6 mg via SUBCUTANEOUS

## 2019-05-14 MED ORDER — GOSERELIN ACETATE 3.6 MG ~~LOC~~ IMPL
DRUG_IMPLANT | SUBCUTANEOUS | Status: AC
  Filled 2019-05-14: qty 3.6

## 2019-06-05 ENCOUNTER — Encounter: Payer: Self-pay | Admitting: Genetic Counselor

## 2019-06-05 NOTE — Progress Notes (Signed)
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

## 2019-06-11 ENCOUNTER — Inpatient Hospital Stay: Payer: Self-pay | Attending: Hematology and Oncology

## 2019-06-11 ENCOUNTER — Other Ambulatory Visit: Payer: Self-pay

## 2019-06-11 VITALS — BP 125/68 | HR 95 | Temp 98.2°F | Resp 18

## 2019-06-11 DIAGNOSIS — Z170 Estrogen receptor positive status [ER+]: Secondary | ICD-10-CM | POA: Insufficient documentation

## 2019-06-11 DIAGNOSIS — C50211 Malignant neoplasm of upper-inner quadrant of right female breast: Secondary | ICD-10-CM | POA: Insufficient documentation

## 2019-06-11 DIAGNOSIS — Z95828 Presence of other vascular implants and grafts: Secondary | ICD-10-CM

## 2019-06-11 DIAGNOSIS — Z5111 Encounter for antineoplastic chemotherapy: Secondary | ICD-10-CM | POA: Insufficient documentation

## 2019-06-11 MED ORDER — GOSERELIN ACETATE 3.6 MG ~~LOC~~ IMPL
3.6000 mg | DRUG_IMPLANT | Freq: Once | SUBCUTANEOUS | Status: AC
  Administered 2019-06-11: 3.6 mg via SUBCUTANEOUS

## 2019-06-11 MED ORDER — GOSERELIN ACETATE 3.6 MG ~~LOC~~ IMPL
DRUG_IMPLANT | SUBCUTANEOUS | Status: AC
  Filled 2019-06-11: qty 3.6

## 2019-06-11 NOTE — Patient Instructions (Signed)

## 2019-07-04 ENCOUNTER — Ambulatory Visit: Payer: BLUE CROSS/BLUE SHIELD | Admitting: Hematology and Oncology

## 2019-07-07 NOTE — Progress Notes (Signed)
Patient Care Team: Suella Broad, FNP as PCP - General (Nurse Practitioner) Nicholas Lose, MD as Consulting Physician (Hematology and Oncology) Alphonsa Overall, MD as Consulting Physician (General Surgery) Gery Pray, MD as Consulting Physician (Radiation Oncology)  DIAGNOSIS:    ICD-10-CM   1. Malignant neoplasm of upper-inner quadrant of right breast in female, estrogen receptor positive ([place])  C50.211    Z17.0     SUMMARY OF ONCOLOGIC HISTORY: Oncology History  Malignant neoplasm of upper-inner quadrant of right breast in female, estrogen receptor positive ([place])  01/10/2018 Initial Diagnosis   Palpable right breast mass with calcifications medial right breast at 1 o'clock position: 2.6 cm by ultrasound, 1 abnormal lymph node: Ultrasound biopsy of both the mass and the lymph node revealed grade 3 invasive ductal carcinoma ER 90%, PR 2%, Ki-67 70%, HER-2 negative ratio 0.96, T2 N1 stage II a AJCC 8 clinical stage   01/17/2018 Cancer Staging   Staging form: Breast, AJCC 8th Edition - Clinical stage from 01/17/2018: Stage IIB (cT2, cN1, cM0, G3, ER+, PR+, HER2-) - Signed by Gardenia Phlegm, NP on 06/20/2018   01/29/2018 - 06/12/2018 Neo-Adjuvant Chemotherapy   Dose dense Adriamycin and Cytoxan followed by Taxol x12  Zoladex started with chemo   02/06/2018 Genetic Testing   UPDATE: CDK4 c.408C>A (Silent) VUS was amended to likely benign due to a re-review of the evidence in light of new variant interpretation guidelines and/or new information.  Multi-Cancer panel (83 genes) @ [facility] - No pathogenic mutations detected Variant of Uncertain Significance in CDK4  Genes Analyzed: 83 genes on [facility]'s Multi-Cancer panel (ALK, APC, ATM, AXIN2, BAP1, BARD1, BLM, BMPR1A, BRCA1, BRCA2, BRIP1, CASR, CDC73, CDH1, CDK4, CDKN1B, CDKN1C, CDKN2A, CEBPA, CHEK2, CTNNA1, DICER1, DIS3L2, EGFR, EPCAM, FH, FLCN, GATA2, GPC3, GREM1, HOXB13, HRAS, KIT, MAX, MEN1, MET, MITF, MLH1, MSH2, MSH3,  MSH6, MUTYH, NBN, NF1, NF2, NTHL1, PALB2, PDGFRA, PHOX2B, PMS2, POLD1, POLE, POT1, PRKAR1A, PTCH1, PTEN, RAD50, RAD51C, RAD51D, RB1, RECQL4, RET, RUNX1, SDHA, SDHAF2, SDHB, SDHC, SDHD, SMAD4, SMARCA4, SMARCB1, SMARCE1, STK11, SUFU, TERC, TERT, TMEM127, TP53, TSC1, TSC2, VHL, WRN, WT1).   02/27/2018 Miscellaneous   Genetics negative   07/13/2018 Surgery   Right lumpectomy: Pathologic complete response 03 lymph nodes negative   08/23/2018 - 10/09/2018 Radiation Therapy   1. Right axilla; 1.8 Gy in 25 fractions for a total of 45 Gy                      2. Right breast; 1.8 Gy in 28 fractions for a total of 50.4 Gy                      3. Lumpectomy Boost; 2 Gy in 5 fractions for a total of 10 Gy   09/2018 -  Anti-estrogen oral therapy   Letrozole     CHIEF COMPLIANT: Follow-up of right breast cancer on letrozole  INTERVAL HISTORY: Kim Griffin is a 23 y.o. with above-mentioned history of right breast cancer who underwent neoadjuvant chemotherapy with complete response, followed by lumpectomy, radiation, and who is currently on anti-estrogen therapy with letrozole and receives Zoladex injections. I last saw her 10 months ago, and in the interim she was seen by Wilber Bihari, NP for survivorship clinic. Mammogram on 01/22/19 showed no evidence of malignancy bilaterally. She presents to the clinic today for follow-up.   REVIEW OF SYSTEMS:   Constitutional: Denies fevers, chills or abnormal weight loss Eyes: Denies blurriness of vision Ears, nose,  mouth, throat, and face: Denies mucositis or sore throat Respiratory: Denies cough, dyspnea or wheezes Cardiovascular: Denies palpitation, chest discomfort Gastrointestinal: Denies nausea, heartburn or change in bowel habits Skin: Denies abnormal skin rashes Lymphatics: Denies new lymphadenopathy or easy bruising Neurological: Denies numbness, tingling or new weaknesses Behavioral/Psych: Mood is stable, no new changes  Extremities: No lower  extremity edema Breast: denies any pain or lumps or nodules in either breasts All other systems were reviewed with the patient and are negative.  I have reviewed the past medical history, past surgical history, social history and family history with the patient and they are unchanged from previous note.  ALLERGIES:  has No Known Allergies.  MEDICATIONS:  Current Outpatient Medications  Medication Sig Dispense Refill  . ferrous sulfate 325 (65 FE) MG tablet Take 325 mg by mouth daily with breakfast.    . HYDROcodone-acetaminophen (NORCO/VICODIN) 5-325 MG tablet Take 1 tablet by mouth every 6 (six) hours as needed for moderate pain. 20 tablet 0  . letrozole (FEMARA) 2.5 MG tablet Take 1 tablet (2.5 mg total) by mouth daily. 90 tablet 3   No current facility-administered medications for this visit.    Facility-Administered Medications Ordered in Other Visits  Medication Dose Route Frequency Provider Last Rate Last Dose  . goserelin (ZOLADEX) injection 3.6 mg  3.6 mg Subcutaneous Once Nicholas Lose, MD        PHYSICAL EXAMINATION: ECOG PERFORMANCE STATUS: 1 - Symptomatic but completely ambulatory  There were no vitals filed for this visit. There were no vitals filed for this visit.  GENERAL: alert, no distress and comfortable SKIN: skin color, texture, turgor are normal, no rashes or significant lesions EYES: normal, Conjunctiva are pink and non-injected, sclera clear OROPHARYNX: no exudate, no erythema and lips, buccal mucosa, and tongue normal  NECK: supple, thyroid normal size, non-tender, without nodularity LYMPH: no palpable lymphadenopathy in the cervical, axillary or inguinal LUNGS: clear to auscultation and percussion with normal breathing effort HEART: regular rate & rhythm and no murmurs and no lower extremity edema ABDOMEN: abdomen soft, non-tender and normal bowel sounds MUSCULOSKELETAL: no cyanosis of digits and no clubbing  NEURO: alert & oriented x 3 with fluent  speech, no focal motor/sensory deficits EXTREMITIES: No lower extremity edema  LABORATORY DATA:  I have reviewed the data as listed CMP Latest Ref Rng & Units 06/12/2018 06/05/2018 05/29/2018  Glucose 70 - 99 mg/dL 204(H) 99 209(H)  BUN 6 - 20 mg/dL 9 11 12   Creatinine 0.44 - 1.00 mg/dL 0.78 0.68 0.82  Sodium 135 - 145 mmol/L 139 141 138  Potassium 3.5 - 5.1 mmol/L 3.6 3.6 3.4(L)  Chloride 98 - 111 mmol/L 102 105 102  CO2 22 - 32 mmol/L 25 26 27   Calcium 8.9 - 10.3 mg/dL 9.3 9.6 9.7  Total Protein 6.5 - 8.1 g/dL 6.9 7.2 7.2  Total Bilirubin 0.3 - 1.2 mg/dL 0.3 0.2(L) 0.3  Alkaline Phos 38 - 126 U/L 69 70 70  AST 15 - 41 U/L 17 20 17   ALT 0 - 44 U/L 26 32 29    Lab Results  Component Value Date   WBC 3.5 (L) 06/12/2018   HGB 10.4 (L) 06/12/2018   HCT 31.7 (L) 06/12/2018   MCV 71.9 (L) 06/12/2018   PLT 337 06/12/2018   NEUTROABS 2.8 06/12/2018    ASSESSMENT & PLAN:  Malignant neoplasm of upper-inner quadrant of right breast in female, estrogen receptor positive ([place]) 01/10/2018:Palpable right breast mass with calcifications medial right breast at  1 o'clock position: 2.6 cm by ultrasound, 1 abnormal lymph node: Ultrasound biopsy of both the mass and the lymph node revealed grade 3 invasive ductal carcinoma ER 90%, PR 2%, Ki-67 70%, HER-2 negative ratio 0.96, T2 N1 stage II a AJCC 8 clinical stage  Recommendation: 1 Genetic testing: Negative.Staging scans: No metastatic disease 2.neoadjuvant chemotherapy with dose dense Adriamycin and Cytoxan followed by Taxol weekly x12 3.Followed by breast conservingsurgeryon 07/13/2018: Pathologic complete response 4.Followed by adjuvant radiation started 08/24/2018   5.Followed by antiestrogen therapy and evaluation on Natalee clinical trial --------------------------------------------------------------------- Recommendation: Antiestrogen therapy with ovarian suppression and letrozole started 09/25/2018 Complete estrogen blockade  toxicities: Occasional hot flashes but tolerating it extremely well.  Surveillance: 1.  Mammogram 01/22/2019: Benign breast density category D 2.  Breast MRI was recommended but not done because of cost reasons. 3.  Breast exam 07/09/2019: Benign  Return to clinic in 1 year for follow-up    No orders of the defined types were placed in this encounter.  The patient has a good understanding of the overall plan. she agrees with it. she will call with any problems that may develop before the next visit here.  Nicholas Lose, MD 07/09/2019  Julious Oka Dorshimer am acting as scribe for Dr. Nicholas Lose.  I have reviewed the above documentation for accuracy and completeness, and I agree with the above.

## 2019-07-09 ENCOUNTER — Inpatient Hospital Stay: Payer: Medicaid Other

## 2019-07-09 ENCOUNTER — Other Ambulatory Visit: Payer: Self-pay

## 2019-07-09 ENCOUNTER — Inpatient Hospital Stay: Payer: Self-pay | Attending: Hematology and Oncology | Admitting: Hematology and Oncology

## 2019-07-09 DIAGNOSIS — Z923 Personal history of irradiation: Secondary | ICD-10-CM

## 2019-07-09 DIAGNOSIS — Z9221 Personal history of antineoplastic chemotherapy: Secondary | ICD-10-CM

## 2019-07-09 DIAGNOSIS — Z95828 Presence of other vascular implants and grafts: Secondary | ICD-10-CM

## 2019-07-09 DIAGNOSIS — C50211 Malignant neoplasm of upper-inner quadrant of right female breast: Secondary | ICD-10-CM

## 2019-07-09 DIAGNOSIS — Z170 Estrogen receptor positive status [ER+]: Secondary | ICD-10-CM

## 2019-07-09 DIAGNOSIS — Z79811 Long term (current) use of aromatase inhibitors: Secondary | ICD-10-CM

## 2019-07-09 DIAGNOSIS — Z5111 Encounter for antineoplastic chemotherapy: Secondary | ICD-10-CM | POA: Insufficient documentation

## 2019-07-09 MED ORDER — LETROZOLE 2.5 MG PO TABS: 2.5000 mg | ORAL_TABLET | Freq: Every day | ORAL | 3 refills | Status: DC

## 2019-07-09 MED ORDER — GOSERELIN ACETATE 3.6 MG ~~LOC~~ IMPL
3.6000 mg | DRUG_IMPLANT | Freq: Once | SUBCUTANEOUS | Status: AC
  Administered 2019-07-09: 11:00:00 3.6 mg via SUBCUTANEOUS

## 2019-07-09 MED ORDER — GOSERELIN ACETATE 3.6 MG ~~LOC~~ IMPL
DRUG_IMPLANT | SUBCUTANEOUS | Status: AC
  Filled 2019-07-09: qty 3.6

## 2019-07-09 NOTE — Assessment & Plan Note (Signed)
01/10/2018:Palpable right breast mass with calcifications medial right breast at 1 o'clock position: 2.6 cm by ultrasound, 1 abnormal lymph node: Ultrasound biopsy of both the mass and the lymph node revealed grade 3 invasive ductal carcinoma ER 90%, PR 2%, Ki-67 70%, HER-2 negative ratio 0.96, T2 N1 stage II a AJCC 8 clinical stage  Recommendation: 1 Genetic testing: Negative.Staging scans: No metastatic disease 2.neoadjuvant chemotherapy with dose dense Adriamycin and Cytoxan followed by Taxol weekly x12 3.Followed by breast conservingsurgeryon 07/13/2018: Pathologic complete response 4.Followed by adjuvant radiation started 08/24/2018   5.Followed by antiestrogen therapy and evaluation on Natalee clinical trial --------------------------------------------------------------------- Recommendation: Antiestrogen therapy with ovarian suppression and letrozole started 09/25/2018 Complete estrogen blockade toxicities:  Surveillance: 1.  Mammogram 01/22/2019: Benign breast density category D 2.  Breast MRI was recommended but not done because of cost reasons. 3.  Breast exam 07/09/2019: Benign  Return to clinic in 1 year for follow-up

## 2019-07-10 ENCOUNTER — Telehealth: Payer: Self-pay | Admitting: Hematology and Oncology

## 2019-07-10 NOTE — Telephone Encounter (Signed)
Scheduled appt per 9/9 schmessage - pt aware of appts added.

## 2019-08-08 ENCOUNTER — Other Ambulatory Visit: Payer: Self-pay

## 2019-08-08 ENCOUNTER — Inpatient Hospital Stay: Payer: Self-pay | Attending: Hematology and Oncology

## 2019-08-08 ENCOUNTER — Inpatient Hospital Stay: Payer: Self-pay

## 2019-08-08 VITALS — BP 134/72 | HR 93 | Temp 98.7°F | Resp 18

## 2019-08-08 DIAGNOSIS — Z170 Estrogen receptor positive status [ER+]: Secondary | ICD-10-CM | POA: Insufficient documentation

## 2019-08-08 DIAGNOSIS — Z95828 Presence of other vascular implants and grafts: Secondary | ICD-10-CM

## 2019-08-08 DIAGNOSIS — Z5111 Encounter for antineoplastic chemotherapy: Secondary | ICD-10-CM | POA: Insufficient documentation

## 2019-08-08 DIAGNOSIS — C50211 Malignant neoplasm of upper-inner quadrant of right female breast: Secondary | ICD-10-CM | POA: Insufficient documentation

## 2019-08-08 MED ORDER — HEPARIN SOD (PORK) LOCK FLUSH 100 UNIT/ML IV SOLN
500.0000 [IU] | Freq: Once | INTRAVENOUS | Status: DC
  Filled 2019-08-08: qty 5

## 2019-08-08 MED ORDER — GOSERELIN ACETATE 3.6 MG ~~LOC~~ IMPL
3.6000 mg | DRUG_IMPLANT | Freq: Once | SUBCUTANEOUS | Status: AC
  Administered 2019-08-08: 10:00:00 3.6 mg via SUBCUTANEOUS

## 2019-08-08 MED ORDER — GOSERELIN ACETATE 3.6 MG ~~LOC~~ IMPL
DRUG_IMPLANT | SUBCUTANEOUS | Status: AC
  Filled 2019-08-08: qty 3.6

## 2019-08-08 MED ORDER — SODIUM CHLORIDE 0.9% FLUSH
10.0000 mL | INTRAVENOUS | Status: DC | PRN
  Filled 2019-08-08: qty 10

## 2019-08-08 NOTE — Progress Notes (Signed)
No flush pt doesn't have port

## 2019-08-08 NOTE — Patient Instructions (Signed)

## 2019-09-05 ENCOUNTER — Other Ambulatory Visit: Payer: Self-pay

## 2019-09-05 ENCOUNTER — Inpatient Hospital Stay: Payer: Medicaid Other

## 2019-09-05 ENCOUNTER — Inpatient Hospital Stay: Payer: Self-pay | Attending: Hematology and Oncology

## 2019-09-05 VITALS — BP 116/71 | HR 81 | Temp 98.2°F | Resp 16

## 2019-09-05 DIAGNOSIS — Z170 Estrogen receptor positive status [ER+]: Secondary | ICD-10-CM | POA: Insufficient documentation

## 2019-09-05 DIAGNOSIS — C50211 Malignant neoplasm of upper-inner quadrant of right female breast: Secondary | ICD-10-CM | POA: Insufficient documentation

## 2019-09-05 DIAGNOSIS — Z95828 Presence of other vascular implants and grafts: Secondary | ICD-10-CM

## 2019-09-05 DIAGNOSIS — Z5111 Encounter for antineoplastic chemotherapy: Secondary | ICD-10-CM | POA: Insufficient documentation

## 2019-09-05 MED ORDER — SODIUM CHLORIDE 0.9% FLUSH
10.0000 mL | INTRAVENOUS | Status: DC | PRN
  Filled 2019-09-05: qty 10

## 2019-09-05 MED ORDER — GOSERELIN ACETATE 3.6 MG ~~LOC~~ IMPL
3.6000 mg | DRUG_IMPLANT | Freq: Once | SUBCUTANEOUS | Status: AC
  Administered 2019-09-05: 3.6 mg via SUBCUTANEOUS

## 2019-09-05 MED ORDER — HEPARIN SOD (PORK) LOCK FLUSH 100 UNIT/ML IV SOLN
500.0000 [IU] | Freq: Once | INTRAVENOUS | Status: DC
  Filled 2019-09-05: qty 5

## 2019-09-05 MED ORDER — GOSERELIN ACETATE 3.6 MG ~~LOC~~ IMPL
DRUG_IMPLANT | SUBCUTANEOUS | Status: AC
  Filled 2019-09-05: qty 3.6

## 2019-09-05 NOTE — Patient Instructions (Addendum)

## 2019-10-03 ENCOUNTER — Other Ambulatory Visit: Payer: Self-pay

## 2019-10-03 ENCOUNTER — Inpatient Hospital Stay: Payer: Self-pay | Attending: Hematology and Oncology

## 2019-10-03 VITALS — BP 118/72 | HR 78 | Temp 98.2°F | Resp 15

## 2019-10-03 DIAGNOSIS — C50211 Malignant neoplasm of upper-inner quadrant of right female breast: Secondary | ICD-10-CM | POA: Insufficient documentation

## 2019-10-03 DIAGNOSIS — Z95828 Presence of other vascular implants and grafts: Secondary | ICD-10-CM

## 2019-10-03 DIAGNOSIS — Z5111 Encounter for antineoplastic chemotherapy: Secondary | ICD-10-CM | POA: Insufficient documentation

## 2019-10-03 DIAGNOSIS — Z170 Estrogen receptor positive status [ER+]: Secondary | ICD-10-CM | POA: Insufficient documentation

## 2019-10-03 MED ORDER — GOSERELIN ACETATE 3.6 MG ~~LOC~~ IMPL
3.6000 mg | DRUG_IMPLANT | Freq: Once | SUBCUTANEOUS | Status: AC
  Administered 2019-10-03: 3.6 mg via SUBCUTANEOUS

## 2019-10-03 MED ORDER — GOSERELIN ACETATE 3.6 MG ~~LOC~~ IMPL
DRUG_IMPLANT | SUBCUTANEOUS | Status: AC
  Filled 2019-10-03: qty 3.6

## 2019-10-03 NOTE — Patient Instructions (Signed)

## 2019-10-31 ENCOUNTER — Inpatient Hospital Stay: Payer: Self-pay

## 2019-11-14 ENCOUNTER — Encounter: Payer: Self-pay | Admitting: *Deleted

## 2019-11-28 ENCOUNTER — Other Ambulatory Visit: Payer: Self-pay

## 2019-11-28 ENCOUNTER — Inpatient Hospital Stay: Payer: 59 | Attending: Hematology and Oncology

## 2019-11-28 VITALS — BP 108/67 | HR 80 | Temp 98.2°F | Resp 18

## 2019-11-28 DIAGNOSIS — Z5111 Encounter for antineoplastic chemotherapy: Secondary | ICD-10-CM | POA: Insufficient documentation

## 2019-11-28 DIAGNOSIS — C50211 Malignant neoplasm of upper-inner quadrant of right female breast: Secondary | ICD-10-CM | POA: Insufficient documentation

## 2019-11-28 DIAGNOSIS — Z95828 Presence of other vascular implants and grafts: Secondary | ICD-10-CM

## 2019-11-28 DIAGNOSIS — Z170 Estrogen receptor positive status [ER+]: Secondary | ICD-10-CM | POA: Insufficient documentation

## 2019-11-28 MED ORDER — GOSERELIN ACETATE 3.6 MG ~~LOC~~ IMPL
DRUG_IMPLANT | SUBCUTANEOUS | Status: AC
  Filled 2019-11-28: qty 3.6

## 2019-11-28 MED ORDER — GOSERELIN ACETATE 3.6 MG ~~LOC~~ IMPL
3.6000 mg | DRUG_IMPLANT | Freq: Once | SUBCUTANEOUS | Status: AC
  Administered 2019-11-28: 3.6 mg via SUBCUTANEOUS

## 2019-12-26 ENCOUNTER — Inpatient Hospital Stay: Payer: 59 | Attending: Hematology and Oncology

## 2019-12-26 ENCOUNTER — Other Ambulatory Visit: Payer: Self-pay

## 2019-12-26 VITALS — BP 112/68 | HR 72 | Temp 98.2°F | Resp 18

## 2019-12-26 DIAGNOSIS — Z170 Estrogen receptor positive status [ER+]: Secondary | ICD-10-CM | POA: Insufficient documentation

## 2019-12-26 DIAGNOSIS — C50211 Malignant neoplasm of upper-inner quadrant of right female breast: Secondary | ICD-10-CM | POA: Insufficient documentation

## 2019-12-26 DIAGNOSIS — Z5111 Encounter for antineoplastic chemotherapy: Secondary | ICD-10-CM | POA: Insufficient documentation

## 2019-12-26 DIAGNOSIS — Z95828 Presence of other vascular implants and grafts: Secondary | ICD-10-CM

## 2019-12-26 MED ORDER — GOSERELIN ACETATE 3.6 MG ~~LOC~~ IMPL
DRUG_IMPLANT | SUBCUTANEOUS | Status: AC
  Filled 2019-12-26: qty 3.6

## 2019-12-26 MED ORDER — GOSERELIN ACETATE 3.6 MG ~~LOC~~ IMPL
3.6000 mg | DRUG_IMPLANT | Freq: Once | SUBCUTANEOUS | Status: AC
  Administered 2019-12-26: 3.6 mg via SUBCUTANEOUS

## 2019-12-26 NOTE — Patient Instructions (Signed)

## 2020-01-23 ENCOUNTER — Other Ambulatory Visit: Payer: Self-pay

## 2020-01-23 ENCOUNTER — Inpatient Hospital Stay: Payer: 59 | Attending: Hematology and Oncology

## 2020-01-23 VITALS — BP 113/71 | HR 80 | Temp 98.7°F | Resp 18

## 2020-01-23 DIAGNOSIS — Z5111 Encounter for antineoplastic chemotherapy: Secondary | ICD-10-CM | POA: Diagnosis not present

## 2020-01-23 DIAGNOSIS — Z170 Estrogen receptor positive status [ER+]: Secondary | ICD-10-CM

## 2020-01-23 DIAGNOSIS — C50211 Malignant neoplasm of upper-inner quadrant of right female breast: Secondary | ICD-10-CM | POA: Insufficient documentation

## 2020-01-23 DIAGNOSIS — Z95828 Presence of other vascular implants and grafts: Secondary | ICD-10-CM

## 2020-01-23 MED ORDER — GOSERELIN ACETATE 3.6 MG ~~LOC~~ IMPL
3.6000 mg | DRUG_IMPLANT | Freq: Once | SUBCUTANEOUS | Status: AC
  Administered 2020-01-23: 3.6 mg via SUBCUTANEOUS

## 2020-01-23 MED ORDER — GOSERELIN ACETATE 3.6 MG ~~LOC~~ IMPL
DRUG_IMPLANT | SUBCUTANEOUS | Status: AC
  Filled 2020-01-23: qty 3.6

## 2020-01-23 NOTE — Patient Instructions (Signed)

## 2020-02-20 ENCOUNTER — Other Ambulatory Visit: Payer: Self-pay

## 2020-02-20 ENCOUNTER — Inpatient Hospital Stay: Payer: 59 | Attending: Hematology and Oncology

## 2020-02-20 VITALS — BP 118/72 | HR 78 | Temp 98.2°F | Resp 18

## 2020-02-20 DIAGNOSIS — C50211 Malignant neoplasm of upper-inner quadrant of right female breast: Secondary | ICD-10-CM | POA: Diagnosis present

## 2020-02-20 DIAGNOSIS — Z170 Estrogen receptor positive status [ER+]: Secondary | ICD-10-CM | POA: Diagnosis not present

## 2020-02-20 DIAGNOSIS — Z95828 Presence of other vascular implants and grafts: Secondary | ICD-10-CM

## 2020-02-20 DIAGNOSIS — Z5111 Encounter for antineoplastic chemotherapy: Secondary | ICD-10-CM | POA: Insufficient documentation

## 2020-02-20 MED ORDER — GOSERELIN ACETATE 3.6 MG ~~LOC~~ IMPL
DRUG_IMPLANT | SUBCUTANEOUS | Status: AC
  Filled 2020-02-20: qty 3.6

## 2020-02-20 MED ORDER — GOSERELIN ACETATE 3.6 MG ~~LOC~~ IMPL
3.6000 mg | DRUG_IMPLANT | Freq: Once | SUBCUTANEOUS | Status: AC
  Administered 2020-02-20: 3.6 mg via SUBCUTANEOUS

## 2020-02-20 NOTE — Patient Instructions (Signed)

## 2020-03-19 ENCOUNTER — Other Ambulatory Visit: Payer: Self-pay

## 2020-03-19 ENCOUNTER — Inpatient Hospital Stay: Payer: 59 | Attending: Hematology and Oncology

## 2020-03-19 VITALS — BP 102/73 | HR 72 | Temp 98.6°F | Resp 18

## 2020-03-19 DIAGNOSIS — Z5111 Encounter for antineoplastic chemotherapy: Secondary | ICD-10-CM | POA: Diagnosis not present

## 2020-03-19 DIAGNOSIS — C50211 Malignant neoplasm of upper-inner quadrant of right female breast: Secondary | ICD-10-CM

## 2020-03-19 DIAGNOSIS — Z95828 Presence of other vascular implants and grafts: Secondary | ICD-10-CM

## 2020-03-19 DIAGNOSIS — C170 Malignant neoplasm of duodenum: Secondary | ICD-10-CM | POA: Diagnosis not present

## 2020-03-19 MED ORDER — GOSERELIN ACETATE 3.6 MG ~~LOC~~ IMPL
DRUG_IMPLANT | SUBCUTANEOUS | Status: AC
  Filled 2020-03-19: qty 3.6

## 2020-03-19 MED ORDER — GOSERELIN ACETATE 3.6 MG ~~LOC~~ IMPL
3.6000 mg | DRUG_IMPLANT | Freq: Once | SUBCUTANEOUS | Status: AC
  Administered 2020-03-19: 3.6 mg via SUBCUTANEOUS

## 2020-03-19 NOTE — Patient Instructions (Signed)

## 2020-04-16 ENCOUNTER — Inpatient Hospital Stay: Payer: 59 | Attending: Hematology and Oncology

## 2020-04-16 ENCOUNTER — Other Ambulatory Visit: Payer: Self-pay

## 2020-04-16 VITALS — BP 106/70 | HR 73 | Temp 98.4°F | Resp 18

## 2020-04-16 DIAGNOSIS — Z5111 Encounter for antineoplastic chemotherapy: Secondary | ICD-10-CM | POA: Insufficient documentation

## 2020-04-16 DIAGNOSIS — C170 Malignant neoplasm of duodenum: Secondary | ICD-10-CM | POA: Diagnosis not present

## 2020-04-16 DIAGNOSIS — C50211 Malignant neoplasm of upper-inner quadrant of right female breast: Secondary | ICD-10-CM | POA: Diagnosis present

## 2020-04-16 DIAGNOSIS — Z95828 Presence of other vascular implants and grafts: Secondary | ICD-10-CM

## 2020-04-16 DIAGNOSIS — Z170 Estrogen receptor positive status [ER+]: Secondary | ICD-10-CM

## 2020-04-16 MED ORDER — GOSERELIN ACETATE 3.6 MG ~~LOC~~ IMPL
3.6000 mg | DRUG_IMPLANT | Freq: Once | SUBCUTANEOUS | Status: AC
  Administered 2020-04-16: 3.6 mg via SUBCUTANEOUS

## 2020-04-16 MED ORDER — GOSERELIN ACETATE 3.6 MG ~~LOC~~ IMPL
DRUG_IMPLANT | SUBCUTANEOUS | Status: AC
  Filled 2020-04-16: qty 3.6

## 2020-04-16 NOTE — Progress Notes (Signed)
PA being faxed today. Okay to proceed with Zoladex per Elliot Gault.

## 2020-04-16 NOTE — Patient Instructions (Signed)

## 2020-05-14 ENCOUNTER — Other Ambulatory Visit: Payer: Self-pay

## 2020-05-14 ENCOUNTER — Inpatient Hospital Stay: Payer: 59 | Attending: Hematology and Oncology

## 2020-05-14 VITALS — BP 114/74 | HR 86 | Temp 98.0°F | Resp 18

## 2020-05-14 DIAGNOSIS — Z5111 Encounter for antineoplastic chemotherapy: Secondary | ICD-10-CM | POA: Insufficient documentation

## 2020-05-14 DIAGNOSIS — C50211 Malignant neoplasm of upper-inner quadrant of right female breast: Secondary | ICD-10-CM | POA: Insufficient documentation

## 2020-05-14 DIAGNOSIS — Z79899 Other long term (current) drug therapy: Secondary | ICD-10-CM | POA: Insufficient documentation

## 2020-05-14 DIAGNOSIS — Z170 Estrogen receptor positive status [ER+]: Secondary | ICD-10-CM | POA: Diagnosis not present

## 2020-05-14 DIAGNOSIS — Z923 Personal history of irradiation: Secondary | ICD-10-CM | POA: Insufficient documentation

## 2020-05-14 DIAGNOSIS — Z95828 Presence of other vascular implants and grafts: Secondary | ICD-10-CM

## 2020-05-14 MED ORDER — GOSERELIN ACETATE 3.6 MG ~~LOC~~ IMPL
3.6000 mg | DRUG_IMPLANT | Freq: Once | SUBCUTANEOUS | Status: AC
  Administered 2020-05-14: 3.6 mg via SUBCUTANEOUS

## 2020-05-14 MED ORDER — GOSERELIN ACETATE 3.6 MG ~~LOC~~ IMPL
DRUG_IMPLANT | SUBCUTANEOUS | Status: AC
  Filled 2020-05-14: qty 3.6

## 2020-06-11 ENCOUNTER — Inpatient Hospital Stay: Payer: 59 | Attending: Hematology and Oncology

## 2020-06-11 ENCOUNTER — Other Ambulatory Visit: Payer: Self-pay

## 2020-06-11 VITALS — BP 111/62 | HR 77 | Temp 98.5°F | Resp 16

## 2020-06-11 DIAGNOSIS — Z170 Estrogen receptor positive status [ER+]: Secondary | ICD-10-CM | POA: Diagnosis not present

## 2020-06-11 DIAGNOSIS — Z79899 Other long term (current) drug therapy: Secondary | ICD-10-CM | POA: Diagnosis not present

## 2020-06-11 DIAGNOSIS — Z923 Personal history of irradiation: Secondary | ICD-10-CM | POA: Diagnosis not present

## 2020-06-11 DIAGNOSIS — C50211 Malignant neoplasm of upper-inner quadrant of right female breast: Secondary | ICD-10-CM | POA: Insufficient documentation

## 2020-06-11 DIAGNOSIS — Z95828 Presence of other vascular implants and grafts: Secondary | ICD-10-CM

## 2020-06-11 DIAGNOSIS — Z5111 Encounter for antineoplastic chemotherapy: Secondary | ICD-10-CM | POA: Insufficient documentation

## 2020-06-11 MED ORDER — GOSERELIN ACETATE 3.6 MG ~~LOC~~ IMPL
3.6000 mg | DRUG_IMPLANT | Freq: Once | SUBCUTANEOUS | Status: AC
  Administered 2020-06-11: 3.6 mg via SUBCUTANEOUS

## 2020-06-11 MED ORDER — GOSERELIN ACETATE 3.6 MG ~~LOC~~ IMPL
DRUG_IMPLANT | SUBCUTANEOUS | Status: AC
  Filled 2020-06-11: qty 3.6

## 2020-06-11 NOTE — Patient Instructions (Signed)

## 2020-07-08 NOTE — Progress Notes (Signed)
Patient Care Team: Suella Broad, FNP as PCP - General (Nurse Practitioner) Nicholas Lose, MD as Consulting Physician (Hematology and Oncology) Alphonsa Overall, MD as Consulting Physician (General Surgery) Gery Pray, MD as Consulting Physician (Radiation Oncology)  DIAGNOSIS:    ICD-10-CM   1. Malignant neoplasm of upper-inner quadrant of right breast in female, estrogen receptor positive ([place])  C50.211    Z17.0     SUMMARY OF ONCOLOGIC HISTORY: Oncology History  Malignant neoplasm of upper-inner quadrant of right breast in female, estrogen receptor positive ([place])  01/10/2018 Initial Diagnosis   Palpable right breast mass with calcifications medial right breast at 1 o'clock position: 2.6 cm by ultrasound, 1 abnormal lymph node: Ultrasound biopsy of both the mass and the lymph node revealed grade 3 invasive ductal carcinoma ER 90%, PR 2%, Ki-67 70%, HER-2 negative ratio 0.96, T2 N1 stage II a AJCC 8 clinical stage   01/17/2018 Cancer Staging   Staging form: Breast, AJCC 8th Edition - Clinical stage from 01/17/2018: Stage IIB (cT2, cN1, cM0, G3, ER+, PR+, HER2-) - Signed by Gardenia Phlegm, NP on 06/20/2018   01/29/2018 - 06/12/2018 Neo-Adjuvant Chemotherapy   Dose dense Adriamycin and Cytoxan followed by Taxol x12  Zoladex started with chemo   02/06/2018 Genetic Testing   UPDATE: CDK4 c.408C>A (Silent) VUS was amended to likely benign due to a re-review of the evidence in light of new variant interpretation guidelines and/or new information.  Multi-Cancer panel (83 genes) @ [facility] - No pathogenic mutations detected Variant of Uncertain Significance in CDK4  Genes Analyzed: 83 genes on [facility]'s Multi-Cancer panel (ALK, APC, ATM, AXIN2, BAP1, BARD1, BLM, BMPR1A, BRCA1, BRCA2, BRIP1, CASR, CDC73, CDH1, CDK4, CDKN1B, CDKN1C, CDKN2A, CEBPA, CHEK2, CTNNA1, DICER1, DIS3L2, EGFR, EPCAM, FH, FLCN, GATA2, GPC3, GREM1, HOXB13, HRAS, KIT, MAX, MEN1, MET, MITF, MLH1, MSH2, MSH3,  MSH6, MUTYH, NBN, NF1, NF2, NTHL1, PALB2, PDGFRA, PHOX2B, PMS2, POLD1, POLE, POT1, PRKAR1A, PTCH1, PTEN, RAD50, RAD51C, RAD51D, RB1, RECQL4, RET, RUNX1, SDHA, SDHAF2, SDHB, SDHC, SDHD, SMAD4, SMARCA4, SMARCB1, SMARCE1, STK11, SUFU, TERC, TERT, TMEM127, TP53, TSC1, TSC2, VHL, WRN, WT1).   02/27/2018 Miscellaneous   Genetics negative   07/13/2018 Surgery   Right lumpectomy: Pathologic complete response 03 lymph nodes negative   08/23/2018 - 10/09/2018 Radiation Therapy   1. Right axilla; 1.8 Gy in 25 fractions for a total of 45 Gy                      2. Right breast; 1.8 Gy in 28 fractions for a total of 50.4 Gy                      3. Lumpectomy Boost; 2 Gy in 5 fractions for a total of 10 Gy   09/2018 -  Anti-estrogen oral therapy   Letrozole     CHIEF COMPLIANT: Follow-up of right breast cancer on letrozole  INTERVAL HISTORY: Kim Griffin is a 24 y.o. with above-mentioned history of right breast cancer who underwent neoadjuvant chemotherapy with complete response, followed by lumpectomy, radiation, and who is currently on anti-estrogen therapy with letrozole and receives Zoladex injections. She presents to the clinic today for follow-up.   ALLERGIES:  has No Known Allergies.  MEDICATIONS:  Current Outpatient Medications  Medication Sig Dispense Refill  . ferrous sulfate 325 (65 FE) MG tablet Take 325 mg by mouth daily with breakfast.    . letrozole (FEMARA) 2.5 MG tablet Take 1 tablet (2.5 mg total) by  mouth daily. 90 tablet 3   No current facility-administered medications for this visit.   Facility-Administered Medications Ordered in Other Visits  Medication Dose Route Frequency Provider Last Rate Last Admin  . goserelin (ZOLADEX) injection 3.6 mg  3.6 mg Subcutaneous Once Nicholas Lose, MD        PHYSICAL EXAMINATION: ECOG PERFORMANCE STATUS: 1 - Symptomatic but completely ambulatory  Vitals:   07/09/20 0935  BP: 110/72  Pulse: 81  Resp: 19  Temp: (!) 96.9 F  (36.1 C)  SpO2: 100%   Filed Weights   07/09/20 0935  Weight: 134 lb 4.8 oz (60.9 kg)    BREAST: No palpable masses or nodules in either right or left breasts. No palpable axillary supraclavicular or infraclavicular adenopathy no breast tenderness or nipple discharge. (exam performed in the presence of a chaperone)  LABORATORY DATA:  I have reviewed the data as listed CMP Latest Ref Rng & Units 06/12/2018 06/05/2018 05/29/2018  Glucose 70 - 99 mg/dL 204(H) 99 209(H)  BUN 6 - 20 mg/dL 9 11 12   Creatinine 0.44 - 1.00 mg/dL 0.78 0.68 0.82  Sodium 135 - 145 mmol/L 139 141 138  Potassium 3.5 - 5.1 mmol/L 3.6 3.6 3.4(L)  Chloride 98 - 111 mmol/L 102 105 102  CO2 22 - 32 mmol/L 25 26 27   Calcium 8.9 - 10.3 mg/dL 9.3 9.6 9.7  Total Protein 6.5 - 8.1 g/dL 6.9 7.2 7.2  Total Bilirubin 0.3 - 1.2 mg/dL 0.3 0.2(L) 0.3  Alkaline Phos 38 - 126 U/L 69 70 70  AST 15 - 41 U/L 17 20 17   ALT 0 - 44 U/L 26 32 29    Lab Results  Component Value Date   WBC 3.5 (L) 06/12/2018   HGB 10.4 (L) 06/12/2018   HCT 31.7 (L) 06/12/2018   MCV 71.9 (L) 06/12/2018   PLT 337 06/12/2018   NEUTROABS 2.8 06/12/2018    ASSESSMENT & PLAN:  Malignant neoplasm of upper-inner quadrant of right breast in female, estrogen receptor positive ([place]) 01/10/2018:Palpable right breast mass with calcifications medial right breast at 1 o'clock position: 2.6 cm by ultrasound, 1 abnormal lymph node: Ultrasound biopsy of both the mass and the lymph node revealed grade 3 invasive ductal carcinoma ER 90%, PR 2%, Ki-67 70%, HER-2 negative ratio 0.96, T2 N1 stage II a AJCC 8 clinical stage  Recommendation: 1 Genetic testing: Negative.Staging scans: No metastatic disease 2.neoadjuvant chemotherapy with dose dense Adriamycin and Cytoxan followed by Taxol weekly x12 3.Followed by breast conservingsurgeryon 07/13/2018: Pathologic complete response 4.Followed by adjuvant radiationstarted 08/24/2018  5.Followed by antiestrogen  therapy and evaluation on Natalee clinical trial --------------------------------------------------------------------- Recommendation: Antiestrogen therapy with ovarian suppression and letrozole started 09/25/2018 Complete estrogen blockade toxicities: Occasional hot flashes but tolerating it extremely well.  Surveillance: 1.  Mammogram 01/22/2019: Benign breast density category D, she will need annual mammogram.  I sent a order. 2.  Breast MRI was recommended but not done because of cost reasons.  She tells me that she now has insurance and therefore we will set it up to be done in March 2022. 3.  Breast exam 07/09/20: Benign  Return to clinic in 1 year for follow-up    No orders of the defined types were placed in this encounter.  The patient has a good understanding of the overall plan. she agrees with it. she will call with any problems that may develop before the next visit here.  Total time spent: 20 mins including face to face time and  time spent for planning, charting and coordination of care  Nicholas Lose, MD 07/09/2020  I, Cloyde Reams Dorshimer, am acting as scribe for Dr. Nicholas Lose.  I have reviewed the above documentation for accuracy and completeness, and I agree with the above.

## 2020-07-09 ENCOUNTER — Other Ambulatory Visit: Payer: Self-pay

## 2020-07-09 ENCOUNTER — Inpatient Hospital Stay: Payer: 59

## 2020-07-09 ENCOUNTER — Inpatient Hospital Stay: Payer: 59 | Attending: Hematology and Oncology | Admitting: Hematology and Oncology

## 2020-07-09 DIAGNOSIS — Z170 Estrogen receptor positive status [ER+]: Secondary | ICD-10-CM

## 2020-07-09 DIAGNOSIS — Z923 Personal history of irradiation: Secondary | ICD-10-CM | POA: Diagnosis not present

## 2020-07-09 DIAGNOSIS — Z95828 Presence of other vascular implants and grafts: Secondary | ICD-10-CM

## 2020-07-09 DIAGNOSIS — C50211 Malignant neoplasm of upper-inner quadrant of right female breast: Secondary | ICD-10-CM | POA: Diagnosis present

## 2020-07-09 DIAGNOSIS — Z79899 Other long term (current) drug therapy: Secondary | ICD-10-CM | POA: Insufficient documentation

## 2020-07-09 DIAGNOSIS — Z5111 Encounter for antineoplastic chemotherapy: Secondary | ICD-10-CM | POA: Diagnosis present

## 2020-07-09 MED ORDER — GOSERELIN ACETATE 3.6 MG ~~LOC~~ IMPL
3.6000 mg | DRUG_IMPLANT | SUBCUTANEOUS | Status: DC
  Administered 2020-07-09: 3.6 mg via SUBCUTANEOUS

## 2020-07-09 MED ORDER — GOSERELIN ACETATE 3.6 MG ~~LOC~~ IMPL
DRUG_IMPLANT | SUBCUTANEOUS | Status: AC
  Filled 2020-07-09: qty 3.6

## 2020-07-09 NOTE — Patient Instructions (Signed)

## 2020-07-09 NOTE — Assessment & Plan Note (Signed)
01/10/2018:Palpable right breast mass with calcifications medial right breast at 1 o'clock position: 2.6 cm by ultrasound, 1 abnormal lymph node: Ultrasound biopsy of both the mass and the lymph node revealed grade 3 invasive ductal carcinoma ER 90%, PR 2%, Ki-67 70%, HER-2 negative ratio 0.96, T2 N1 stage II a AJCC 8 clinical stage  Recommendation: 1 Genetic testing: Negative.Staging scans: No metastatic disease 2.neoadjuvant chemotherapy with dose dense Adriamycin and Cytoxan followed by Taxol weekly x12 3.Followed by breast conservingsurgeryon 07/13/2018: Pathologic complete response 4.Followed by adjuvant radiationstarted 08/24/2018  5.Followed by antiestrogen therapy and evaluation on Natalee clinical trial --------------------------------------------------------------------- Recommendation: Antiestrogen therapy with ovarian suppression and letrozole started 09/25/2018 Complete estrogen blockade toxicities: Occasional hot flashes but tolerating it extremely well.  Surveillance: 1.  Mammogram 01/22/2019: Benign breast density category D 2.  Breast MRI was recommended but not done because of cost reasons. 3.  Breast exam 07/09/20: Benign  Return to clinic in 1 year for follow-up

## 2020-07-29 ENCOUNTER — Other Ambulatory Visit: Payer: Self-pay

## 2020-07-29 ENCOUNTER — Ambulatory Visit
Admission: RE | Admit: 2020-07-29 | Discharge: 2020-07-29 | Disposition: A | Discharge status: Home / Self Care | Payer: 59 | Source: Ambulatory Visit | Attending: Hematology and Oncology | Admitting: Hematology and Oncology

## 2020-07-29 DIAGNOSIS — C50211 Malignant neoplasm of upper-inner quadrant of right female breast: Secondary | ICD-10-CM

## 2020-07-29 HISTORY — DX: Personal history of irradiation: Z92.3

## 2020-07-29 HISTORY — DX: Personal history of antineoplastic chemotherapy: Z92.21

## 2020-08-06 ENCOUNTER — Other Ambulatory Visit: Payer: Self-pay

## 2020-08-06 ENCOUNTER — Inpatient Hospital Stay: Payer: 59 | Attending: Hematology and Oncology

## 2020-08-06 VITALS — BP 110/64 | HR 84 | Resp 18

## 2020-08-06 DIAGNOSIS — Z5111 Encounter for antineoplastic chemotherapy: Secondary | ICD-10-CM | POA: Insufficient documentation

## 2020-08-06 DIAGNOSIS — Z923 Personal history of irradiation: Secondary | ICD-10-CM | POA: Insufficient documentation

## 2020-08-06 DIAGNOSIS — C50211 Malignant neoplasm of upper-inner quadrant of right female breast: Secondary | ICD-10-CM | POA: Diagnosis present

## 2020-08-06 DIAGNOSIS — Z95828 Presence of other vascular implants and grafts: Secondary | ICD-10-CM

## 2020-08-06 DIAGNOSIS — Z79899 Other long term (current) drug therapy: Secondary | ICD-10-CM | POA: Insufficient documentation

## 2020-08-06 DIAGNOSIS — Z170 Estrogen receptor positive status [ER+]: Secondary | ICD-10-CM | POA: Insufficient documentation

## 2020-08-06 MED ORDER — GOSERELIN ACETATE 3.6 MG ~~LOC~~ IMPL
DRUG_IMPLANT | SUBCUTANEOUS | Status: AC
  Filled 2020-08-06: qty 3.6

## 2020-08-06 MED ORDER — GOSERELIN ACETATE 3.6 MG ~~LOC~~ IMPL
3.6000 mg | DRUG_IMPLANT | SUBCUTANEOUS | Status: DC
  Administered 2020-08-06: 3.6 mg via SUBCUTANEOUS

## 2020-08-06 NOTE — Patient Instructions (Signed)

## 2020-08-31 ENCOUNTER — Ambulatory Visit (HOSPITAL_COMMUNITY)
Admission: EM | Admit: 2020-08-31 | Discharge: 2020-08-31 | Disposition: A | Discharge status: Home / Self Care | Payer: 59 | Attending: Family Medicine | Admitting: Family Medicine

## 2020-08-31 ENCOUNTER — Encounter (HOSPITAL_COMMUNITY): Payer: Self-pay

## 2020-08-31 ENCOUNTER — Other Ambulatory Visit: Payer: Self-pay

## 2020-08-31 ENCOUNTER — Ambulatory Visit (INDEPENDENT_AMBULATORY_CARE_PROVIDER_SITE_OTHER): Payer: 59

## 2020-08-31 DIAGNOSIS — W228XXA Striking against or struck by other objects, initial encounter: Secondary | ICD-10-CM

## 2020-08-31 DIAGNOSIS — M79644 Pain in right finger(s): Secondary | ICD-10-CM

## 2020-08-31 DIAGNOSIS — S62652A Nondisplaced fracture of medial phalanx of right middle finger, initial encounter for closed fracture: Secondary | ICD-10-CM

## 2020-08-31 NOTE — Progress Notes (Signed)
Orthopedic Tech Progress Note Patient Details:  Kim [facility] Apr 20, 1996 [id number] Applied finger splint. Ortho Devices Type of Ortho Device: Finger splint Ortho Device/Splint Location: RUE Ortho Device/Splint Interventions: Ordered, Application   Post Interventions Patient Tolerated: Well Instructions Provided: Adjustment of device   Petra Kuba 08/31/2020, 8:10 PM

## 2020-08-31 NOTE — ED Triage Notes (Signed)
Pt c/o pain right 3rd finger. Pt states that while placing a heavy box onto a shelf yesterday, the box slipped and she was forced to catch it at "weird angle", causing pain to her finger. Pt reports that later in the afternoon yesterday, pt struck the same finger on the gear shift of her car, causing more pain to the middle joint of the finger and how has difficulty fully flexing the finger. Mild edema noted to right third finger at mid-joint area. Brisk cap refill, fingers warm.

## 2020-08-31 NOTE — ED Provider Notes (Signed)
[facility]    CSN: [id number] Arrival date & time: 08/31/20  1554      History   Chief Complaint Chief Complaint  Patient presents with  . Finger Injury    HPI Kim Griffin is a 24 y.o. female.   Patient presenting today with right third finger pain and mild swelling after smashing it under a heavy box yesterday. She later hit it against the gear shift in her car which was very painful. Since has had stiffness, pain, mild swelling to the finger. Able to move, and no discoloration, numbness, tingling. Has had finger in a finger splint since incident. Not taking anything OTC for pain.      Past Medical History:  Diagnosis Date  . Anemia    takes iron supplement  . Breast cancer, right ([place]) 12/2017  . History of chemotherapy    finished chemo 06/12/2018  . Personal history of chemotherapy   . Personal history of radiation therapy   . Sickle cell trait [facility])     Patient Active Problem List   Diagnosis Date Noted  . Port-A-Cath in place 03/28/2018  . Genetic testing 02/08/2018  . Malignant neoplasm of upper-inner quadrant of right breast in female, estrogen receptor positive ([place]) 01/16/2018    Past Surgical History:  Procedure Laterality Date  . BREAST LUMPECTOMY Right   . BREAST LUMPECTOMY WITH RADIOACTIVE SEED AND SENTINEL LYMPH NODE BIOPSY Right 07/13/2018   Procedure: RIGHT BREAST LUMPECTOMY X 2 WITH RADIOACTIVE SEED X 2, RIGHT SEED TARGETED AXILLARY LYMPH NODE EXCISION AND RIGHT SENTINEL LYMPH NODE BIOPSY;  Surgeon: Alphonsa Overall, MD;  Location: [place];  Service: General;  Laterality: Right;  . PORT-A-CATH REMOVAL Right 07/13/2018   Procedure: REMOVAL PORT-A-CATH;  Surgeon: Alphonsa Overall, MD;  Location: [place];  Service: General;  Laterality: Right;  . PORTACATH PLACEMENT N/A 01/22/2018   Procedure: INSERTION PORT-A-CATH WITH ULTRASOUND ERAS PATHWAY;  Surgeon: Alphonsa Overall, MD;  Location: [place];  Service:  General;  Laterality: N/A;  . WISDOM TOOTH EXTRACTION      OB History   No obstetric history on file.      Home Medications    Prior to Admission medications   Medication Sig Start Date End Date Taking? Authorizing Provider  ferrous sulfate 325 (65 FE) MG tablet Take 325 mg by mouth daily with breakfast.   Yes [provider]  letrozole (FEMARA) 2.5 MG tablet Take 1 tablet (2.5 mg total) by mouth daily. 07/09/19  Yes Nicholas Lose, MD    Family History Family History  Problem Relation Age of Onset  . Lung cancer Other        2 of maternal grandmother's siblings  . Breast cancer Cousin        2nd cousin    Social History Social History   Tobacco Use  . Smoking status: Never Smoker  . Smokeless tobacco: Never Used  Vaping Use  . Vaping Use: Never used  Substance Use Topics  . Alcohol use: No  . Drug use: No     Allergies   Patient has no known allergies.   Review of Systems Review of Systems PER HPI   Physical Exam Triage Vital Signs ED Triage Vitals  Enc Vitals Group     BP 08/31/20 1733 128/74     Pulse Rate 08/31/20 1733 85     Resp 08/31/20 1733 16     Temp 08/31/20 1733 98.8 F (37.1 C)  Temp Source 08/31/20 1733 Oral     SpO2 08/31/20 1733 100 %     Weight --      Height --      Head Circumference --      Peak Flow --      Pain Score 08/31/20 1731 3     Pain Loc --      Pain Edu? --      Excl. in [place]? --    No data found.  Updated Vital Signs BP 128/74 (BP Location: Left Arm)   Pulse 85   Temp 98.8 F (37.1 C) (Oral)   Resp 16   SpO2 100%   Visual Acuity Right Eye Distance:   Left Eye Distance:   Bilateral Distance:    Right Eye Near:   Left Eye Near:    Bilateral Near:     Physical Exam Vitals and nursing note reviewed.  Constitutional:      Appearance: Normal appearance. She is not ill-appearing.  HENT:     Head: Atraumatic.  Eyes:     Extraocular Movements: Extraocular movements intact.      Conjunctiva/sclera: Conjunctivae normal.  Cardiovascular:     Rate and Rhythm: Normal rate and regular rhythm.     Pulses: Normal pulses.     Heart sounds: Normal heart sounds.  Pulmonary:     Effort: Pulmonary effort is normal.     Breath sounds: Normal breath sounds.  Musculoskeletal:        General: Swelling (trace edema right third finger) and tenderness (ttp over PIP right 3rd finger) present. No deformity. Normal range of motion.     Cervical back: Normal range of motion and neck supple.  Skin:    General: Skin is warm and dry.     Findings: No bruising or erythema.  Neurological:     Mental Status: She is alert and oriented to person, place, and time.     Comments: Right hand neurovascularly intact  Psychiatric:        Mood and Affect: Mood normal.        Thought Content: Thought content normal.        Judgment: Judgment normal.      UC Treatments / Results  Labs (all labs ordered are listed, but only abnormal results are displayed) Labs Reviewed - No data to display  EKG   Radiology DG Finger Middle Right  Result Date: 08/31/2020 CLINICAL DATA:  Injury, edema, limited range of motion. Long finger pain after hitting against gear shift. EXAM: RIGHT MIDDLE FINGER 2+V COMPARISON:  None. FINDINGS: Volar plate fracture of the middle phalanx is essentially nondisplaced. There is mild straightening of the digit with slight apex volar angulation of the distal phalanx at the distal interphalangeal joint. No associated distal phalanx fracture. Soft tissue edema about the volar aspect. IMPRESSION: Nondisplaced volar plate fracture of the middle phalanx. Electronically Signed   By: Keith Rake M.D.   On: 08/31/2020 18:29    Procedures Procedures (including critical care time)  Medications Ordered in UC Medications - No data to display  Initial Impression / Assessment and Plan / UC Course  I have reviewed the triage vital signs and the nursing notes.  Pertinent labs &  imaging results that were available during my care of the patient were reviewed by me and considered in my medical decision making (see chart for details).     X-ray showing nondisplaced volar plate fracture of the right 3rd middle phalanx. Finger splinted, work  note given for several days off, [facility] information given for her to f/u for recheck later this week. Discussed OTC pain relievers and supportive home care.   Final Clinical Impressions(s) / UC Diagnoses   Final diagnoses:  Nondisplaced fracture of middle phalanx of right middle finger, initial encounter for closed fracture   Discharge Instructions   None    ED Prescriptions    None     PDMP not reviewed this encounter.   Volney American, [place] 08/31/20 1856

## 2020-09-10 ENCOUNTER — Inpatient Hospital Stay: Payer: 59 | Attending: Hematology and Oncology

## 2020-09-10 ENCOUNTER — Other Ambulatory Visit: Payer: Self-pay

## 2020-09-10 VITALS — BP 114/75 | HR 83 | Resp 18

## 2020-09-10 DIAGNOSIS — Z79899 Other long term (current) drug therapy: Secondary | ICD-10-CM | POA: Insufficient documentation

## 2020-09-10 DIAGNOSIS — Z170 Estrogen receptor positive status [ER+]: Secondary | ICD-10-CM | POA: Insufficient documentation

## 2020-09-10 DIAGNOSIS — Z923 Personal history of irradiation: Secondary | ICD-10-CM | POA: Insufficient documentation

## 2020-09-10 DIAGNOSIS — C50211 Malignant neoplasm of upper-inner quadrant of right female breast: Secondary | ICD-10-CM | POA: Diagnosis not present

## 2020-09-10 DIAGNOSIS — Z95828 Presence of other vascular implants and grafts: Secondary | ICD-10-CM

## 2020-09-10 MED ORDER — GOSERELIN ACETATE 3.6 MG ~~LOC~~ IMPL
DRUG_IMPLANT | SUBCUTANEOUS | Status: AC
  Filled 2020-09-10: qty 3.6

## 2020-09-10 MED ORDER — GOSERELIN ACETATE 3.6 MG ~~LOC~~ IMPL
3.6000 mg | DRUG_IMPLANT | SUBCUTANEOUS | Status: DC
  Administered 2020-09-10: 3.6 mg via SUBCUTANEOUS

## 2020-09-10 NOTE — Patient Instructions (Signed)

## 2020-09-18 ENCOUNTER — Ambulatory Visit (INDEPENDENT_AMBULATORY_CARE_PROVIDER_SITE_OTHER): Payer: 59

## 2020-09-18 ENCOUNTER — Ambulatory Visit (INDEPENDENT_AMBULATORY_CARE_PROVIDER_SITE_OTHER): Payer: 59 | Admitting: Family Medicine

## 2020-09-18 ENCOUNTER — Other Ambulatory Visit: Payer: Self-pay

## 2020-09-18 ENCOUNTER — Encounter: Payer: Self-pay | Admitting: Family Medicine

## 2020-09-18 DIAGNOSIS — M79644 Pain in right finger(s): Secondary | ICD-10-CM

## 2020-09-18 DIAGNOSIS — S62652A Nondisplaced fracture of medial phalanx of right middle finger, initial encounter for closed fracture: Secondary | ICD-10-CM | POA: Diagnosis not present

## 2020-09-18 NOTE — Progress Notes (Signed)
Office Visit Note   Patient: Kim Griffin           Date of Birth: 1995-12-09           MRN: [id number] Visit Date: 09/18/2020 Requested by: Suella Broad, FNP [address] [place],  [place] [address] PCP: Suella Broad, FNP  Subjective: Chief Complaint  Patient presents with  . Right Middle Finger - Fracture    HPI: She is here with right third finger fracture.  On November 1 she was at work in the Scientist, research (physical sciences) at [employer], lifting a heavy box.  The box shifted on the shelf and started to fall, pinning her finger against the shelf.  She had immediate pain but was able to finish her shift.  Afterward she went to the ER where x-rays revealed a nondisplaced volar plate avulsion fracture at the PIP joint.  She was given an aluminum splint and has been out of work since then.  She is right-hand dominant, no previous problems with her finger.  It does seem to be improving but she still has a lot of pain when trying to flex it.  She has a history of right breast cancer, but is otherwise in good health.  She is a non-smoker.                ROS:   All other systems were reviewed and are negative.  Objective: Vital Signs: There were no vitals taken for this visit.  Physical Exam:  General:  Alert and oriented, in no acute distress. Pulm:  Breathing unlabored. Psy:  Normal mood, congruent affect. Skin: No bruising or erythema Right hand: Her third finger PIP joint is tender on the volar aspect.  She has limited active flexion at the PIP joint but her flexor digitorum profundus and superficialis tendon functions are intact.  There is no laxity of the collateral ligaments at the PIP joint.  No tenderness at the MCP or DIP joints.  Imaging: XR Finger Middle Right  Result Date: 09/18/2020 X-rays of the right third finger show anatomic alignment of the volar plate avulsion fracture.  Clinically appears to be healing.  No other abnormality  seen.   Assessment & Plan: 1.  Stable 3-week status post right third finger PIP joint volar plate avulsion fracture -We will start hand therapy to work on range of motion.  Can buddy tape for protection if desired, but no splinting needed as long as she is careful. -Okay to return to work next week minimizing use of right hand for 3 weeks. -Follow-up in 3 weeks for recheck.  No x-rays needed if pain is improving.     Procedures: No procedures performed  No notes on file     PMFS History: Patient Active Problem List   Diagnosis Date Noted  . Port-A-Cath in place 03/28/2018  . Genetic testing 02/08/2018  . Malignant neoplasm of upper-inner quadrant of right breast in female, estrogen receptor positive ([place]) 01/16/2018   Past Medical History:  Diagnosis Date  . Anemia    takes iron supplement  . Breast cancer, right ([place] [facility]) 12/2017  . History of chemotherapy    finished chemo 06/12/2018  . Personal history of chemotherapy   . Personal history of radiation therapy   . Sickle cell trait ([facility])     Family History  Problem Relation Age of Onset  . Lung cancer Other        2 of maternal grandmother's siblings  .  Breast cancer Cousin        2nd cousin    Past Surgical History:  Procedure Laterality Date  . BREAST LUMPECTOMY Right   . BREAST LUMPECTOMY WITH RADIOACTIVE SEED AND SENTINEL LYMPH NODE BIOPSY Right 07/13/2018   Procedure: RIGHT BREAST LUMPECTOMY X 2 WITH RADIOACTIVE SEED X 2, RIGHT SEED TARGETED AXILLARY LYMPH NODE EXCISION AND RIGHT SENTINEL LYMPH NODE BIOPSY;  Surgeon: Alphonsa Overall, MD;  Location: [place];  Service: General;  Laterality: Right;  . PORT-A-CATH REMOVAL Right 07/13/2018   Procedure: REMOVAL PORT-A-CATH;  Surgeon: Alphonsa Overall, MD;  Location: [facility];  Service: General;  Laterality: Right;  . PORTACATH PLACEMENT N/A 01/22/2018   Procedure: INSERTION PORT-A-CATH WITH ULTRASOUND ERAS PATHWAY;  Surgeon: Alphonsa Overall, MD;   Location: [facility];  Service: General;  Laterality: N/A;  . WISDOM TOOTH EXTRACTION     Social History   Occupational History  . Not on file  Tobacco Use  . Smoking status: Never Smoker  . Smokeless tobacco: Never Used  Vaping Use  . Vaping Use: Never used  Substance and Sexual Activity  . Alcohol use: No  . Drug use: No  . Sexual activity: Not Currently

## 2020-10-08 ENCOUNTER — Other Ambulatory Visit: Payer: Self-pay | Admitting: Hematology and Oncology

## 2020-10-08 ENCOUNTER — Other Ambulatory Visit: Payer: Self-pay

## 2020-10-08 ENCOUNTER — Inpatient Hospital Stay: Payer: 59 | Attending: Hematology and Oncology

## 2020-10-08 ENCOUNTER — Other Ambulatory Visit: Payer: Self-pay | Admitting: *Deleted

## 2020-10-08 VITALS — BP 109/69 | HR 77 | Resp 18

## 2020-10-08 DIAGNOSIS — Z923 Personal history of irradiation: Secondary | ICD-10-CM | POA: Diagnosis not present

## 2020-10-08 DIAGNOSIS — C50211 Malignant neoplasm of upper-inner quadrant of right female breast: Secondary | ICD-10-CM | POA: Insufficient documentation

## 2020-10-08 DIAGNOSIS — Z79899 Other long term (current) drug therapy: Secondary | ICD-10-CM | POA: Insufficient documentation

## 2020-10-08 DIAGNOSIS — Z170 Estrogen receptor positive status [ER+]: Secondary | ICD-10-CM | POA: Insufficient documentation

## 2020-10-08 MED ORDER — GOSERELIN ACETATE 3.6 MG ~~LOC~~ IMPL
3.6000 mg | DRUG_IMPLANT | Freq: Once | SUBCUTANEOUS | Status: AC
  Administered 2020-10-08: 3.6 mg via SUBCUTANEOUS

## 2020-10-08 MED ORDER — GOSERELIN ACETATE 3.6 MG ~~LOC~~ IMPL
DRUG_IMPLANT | SUBCUTANEOUS | Status: AC
  Filled 2020-10-08: qty 3.6

## 2020-10-08 NOTE — Patient Instructions (Signed)

## 2020-10-09 ENCOUNTER — Ambulatory Visit (INDEPENDENT_AMBULATORY_CARE_PROVIDER_SITE_OTHER): Payer: 59 | Admitting: Family Medicine

## 2020-10-09 ENCOUNTER — Encounter: Payer: Self-pay | Admitting: Family Medicine

## 2020-10-09 DIAGNOSIS — S62629D Displaced fracture of medial phalanx of unspecified finger, subsequent encounter for fracture with routine healing: Secondary | ICD-10-CM

## 2020-10-09 DIAGNOSIS — S62622A Displaced fracture of medial phalanx of right middle finger, initial encounter for closed fracture: Secondary | ICD-10-CM

## 2020-10-09 NOTE — Progress Notes (Signed)
Office Visit Note   Patient: Kim Griffin           Date of Birth: 12/23/1995           MRN: [id number] Visit Date: 10/09/2020 Requested by: Suella Broad, FNP [address] [place],  [place] [address] PCP: Suella Broad, FNP  Subjective: Chief Complaint  Patient presents with  . Right Middle Finger - Fracture, Follow-up    Only a little pain in the finger - much better than before. Had to hold off on therapy - did not take her insurance.    HPI: She is about 5 weeks status post right hand third finger PIP joint volar plate avulsion fracture.  She was unable to see go to physical therapy due to insurance reasons, but has been doing range of motion exercises at home on her own.  She feels substantially better, and hardly bothers her.                ROS:   All other systems were reviewed and are negative.  Objective: Vital Signs: There were no vitals taken for this visit.  Physical Exam:  General:  Alert and oriented, in no acute distress. Pulm:  Breathing unlabored. Psy:  Normal mood, congruent affect.  Right hand: Her third finger has full flexion and extension compared to the left.  There is minimal tenderness to palpation at the PIP joint volar plate.  No laxity of the collateral ligaments.    Imaging: No results found.  Assessment & Plan: 1.  Clinically healing 5 weeks status post right third finger PIP volar plate avulsion fracture -I anticipate she will be completely pain-free in another 3 to 4 weeks.  At this point she can follow-up as needed.     Procedures: No procedures performed        PMFS History: Patient Active Problem List   Diagnosis Date Noted  . Port-A-Cath in place 03/28/2018  . Genetic testing 02/08/2018  . Malignant neoplasm of upper-inner quadrant of right breast in female, estrogen receptor positive ([place]) 01/16/2018   Past Medical History:  Diagnosis Date  . Anemia    takes iron supplement  .  Breast cancer, right ([place]) 12/2017  . History of chemotherapy    finished chemo 06/12/2018  . Personal history of chemotherapy   . Personal history of radiation therapy   . Sickle cell trait ([facility])     Family History  Problem Relation Age of Onset  . Lung cancer Other        2 of maternal grandmother's siblings  . Breast cancer Cousin        2nd cousin    Past Surgical History:  Procedure Laterality Date  . BREAST LUMPECTOMY Right   . BREAST LUMPECTOMY WITH RADIOACTIVE SEED AND SENTINEL LYMPH NODE BIOPSY Right 07/13/2018   Procedure: RIGHT BREAST LUMPECTOMY X 2 WITH RADIOACTIVE SEED X 2, RIGHT SEED TARGETED AXILLARY LYMPH NODE EXCISION AND RIGHT SENTINEL LYMPH NODE BIOPSY;  Surgeon: Alphonsa Overall, MD;  Location: [facility];  Service: General;  Laterality: Right;  . PORT-A-CATH REMOVAL Right 07/13/2018   Procedure: REMOVAL PORT-A-CATH;  Surgeon: Alphonsa Overall, MD;  Location: [place];  Service: General;  Laterality: Right;  . PORTACATH PLACEMENT N/A 01/22/2018   Procedure: INSERTION PORT-A-CATH WITH ULTRASOUND ERAS PATHWAY;  Surgeon: Alphonsa Overall, MD;  Location: [place];  Service: General;  Laterality: N/A;  . WISDOM TOOTH EXTRACTION     Social  History   Occupational History  . Not on file  Tobacco Use  . Smoking status: Never Smoker  . Smokeless tobacco: Never Used  Vaping Use  . Vaping Use: Never used  Substance and Sexual Activity  . Alcohol use: No  . Drug use: No  . Sexual activity: Not Currently

## 2021-07-07 NOTE — Progress Notes (Signed)
Patient Care Team: Suella Broad, FNP as PCP - General (Nurse Practitioner) Nicholas Lose, MD as Consulting Physician (Hematology and Oncology) Alphonsa Overall, MD as Consulting Physician (General Surgery) Gery Pray, MD as Consulting Physician (Radiation Oncology)  DIAGNOSIS:    ICD-10-CM   1. Malignant neoplasm of upper-inner quadrant of right breast in female, estrogen receptor positive ([place])  C50.211    Z17.0       SUMMARY OF ONCOLOGIC HISTORY: Oncology History  Malignant neoplasm of upper-inner quadrant of right breast in female, estrogen receptor positive ([place])  01/10/2018 Initial Diagnosis   Palpable right breast mass with calcifications medial right breast at 1 o'clock position: 2.6 cm by ultrasound, 1 abnormal lymph node: Ultrasound biopsy of both the mass and the lymph node revealed grade 3 invasive ductal carcinoma ER 90%, PR 2%, Ki-67 70%, HER-2 negative ratio 0.96, T2 N1 stage II a AJCC 8 clinical stage   01/17/2018 Cancer Staging   Staging form: Breast, AJCC 8th Edition - Clinical stage from 01/17/2018: Stage IIB (cT2, cN1, cM0, G3, ER+, PR+, HER2-) - Signed by Gardenia Phlegm, NP on 06/20/2018   01/29/2018 - 06/12/2018 Neo-Adjuvant Chemotherapy   Dose dense Adriamycin and Cytoxan followed by Taxol x12  Zoladex started with chemo   02/06/2018 Genetic Testing   UPDATE: CDK4 c.408C>A (Silent) VUS was amended to likely benign due to a re-review of the evidence in light of new variant interpretation guidelines and/or new information.  Multi-Cancer panel (83 genes) @ [facility] - No pathogenic mutations detected Variant of Uncertain Significance in CDK4  Genes Analyzed: 83 genes on [facility]'s Multi-Cancer panel (ALK, APC, ATM, AXIN2, BAP1, BARD1, BLM, BMPR1A, BRCA1, BRCA2, BRIP1, CASR, CDC73, CDH1, CDK4, CDKN1B, CDKN1C, CDKN2A, CEBPA, CHEK2, CTNNA1, DICER1, DIS3L2, EGFR, EPCAM, FH, FLCN, GATA2, GPC3, GREM1, HOXB13, HRAS, KIT, MAX, MEN1, MET, MITF, MLH1, MSH2,  MSH3, MSH6, MUTYH, NBN, NF1, NF2, NTHL1, PALB2, PDGFRA, PHOX2B, PMS2, POLD1, POLE, POT1, PRKAR1A, PTCH1, PTEN, RAD50, RAD51C, RAD51D, RB1, RECQL4, RET, RUNX1, SDHA, SDHAF2, SDHB, SDHC, SDHD, SMAD4, SMARCA4, SMARCB1, SMARCE1, STK11, SUFU, TERC, TERT, TMEM127, TP53, TSC1, TSC2, VHL, WRN, WT1).   02/27/2018 Miscellaneous   Genetics negative   07/13/2018 Surgery   Right lumpectomy: Pathologic complete response 03 lymph nodes negative   08/23/2018 - 10/09/2018 Radiation Therapy   1. Right axilla; 1.8 Gy in 25 fractions for a total of 45 Gy                      2. Right breast; 1.8 Gy in 28 fractions for a total of 50.4 Gy                      3. Lumpectomy Boost; 2 Gy in 5 fractions for a total of 10 Gy   09/2018 -  Anti-estrogen oral therapy   Letrozole     CHIEF COMPLIANT: Follow-up of right breast cancer on letrozole  INTERVAL HISTORY: Kim Griffin is a 25 y.o. with above-mentioned history of right breast cancer who underwent neoadjuvant chemotherapy with complete response, followed by lumpectomy, radiation, and who is currently on anti-estrogen therapy with letrozole and receives Zoladex injections. She presents to the clinic today for follow-up.  For some reason she has not received any Zoladex injections since December 2021.  She is also not been taking her letrozole pills as she ran out of them.  ALLERGIES:  has No Known Allergies.  MEDICATIONS:  Current Outpatient Medications  Medication Sig Dispense Refill  ferrous sulfate 325 (65 FE) MG tablet Take 325 mg by mouth daily with breakfast.     letrozole (FEMARA) 2.5 MG tablet Take 1 tablet (2.5 mg total) by mouth daily. 90 tablet 3   No current facility-administered medications for this visit.    PHYSICAL EXAMINATION: ECOG PERFORMANCE STATUS: 1 - Symptomatic but completely ambulatory  Vitals:   07/08/21 1039  BP: 131/64  Pulse: 79  Resp: 18  Temp: (!) 97.4 F (36.3 C)  SpO2: 100%   Filed Weights   07/08/21 1039   Weight: 133 lb 1.6 oz (60.4 kg)    BREAST: No palpable masses or nodules in either right or left breasts. No palpable axillary supraclavicular or infraclavicular adenopathy no breast tenderness or nipple discharge. (exam performed in the presence of a chaperone)  LABORATORY DATA:  I have reviewed the data as listed CMP Latest Ref Rng & Units 06/12/2018 06/05/2018 05/29/2018  Glucose 70 - 99 mg/dL 204(H) 99 209(H)  BUN 6 - 20 mg/dL 9 11 12   Creatinine 0.44 - 1.00 mg/dL 0.78 0.68 0.82  Sodium 135 - 145 mmol/L 139 141 138  Potassium 3.5 - 5.1 mmol/L 3.6 3.6 3.4(L)  Chloride 98 - 111 mmol/L 102 105 102  CO2 22 - 32 mmol/L 25 26 27   Calcium 8.9 - 10.3 mg/dL 9.3 9.6 9.7  Total Protein 6.5 - 8.1 g/dL 6.9 7.2 7.2  Total Bilirubin 0.3 - 1.2 mg/dL 0.3 0.2(L) 0.3  Alkaline Phos 38 - 126 U/L 69 70 70  AST 15 - 41 U/L 17 20 17   ALT 0 - 44 U/L 26 32 29    Lab Results  Component Value Date   WBC 3.5 (L) 06/12/2018   HGB 10.4 (L) 06/12/2018   HCT 31.7 (L) 06/12/2018   MCV 71.9 (L) 06/12/2018   PLT 337 06/12/2018   NEUTROABS 2.8 06/12/2018    ASSESSMENT & PLAN:  Malignant neoplasm of upper-inner quadrant of right breast in female, estrogen receptor positive ([facility]) 01/10/2018:Palpable right breast mass with calcifications medial right breast at 1 o'clock position: 2.6 cm by ultrasound, 1 abnormal lymph node: Ultrasound biopsy of both the mass and the lymph node revealed grade 3 invasive ductal carcinoma ER 90%, PR 2%, Ki-67 70%, HER-2 negative ratio 0.96, T2 N1 stage II a AJCC 8 clinical stage   Recommendation: 1 Genetic testing: Negative.  Staging scans: No metastatic disease 2. neoadjuvant chemotherapy with dose dense Adriamycin and Cytoxan followed by Taxol weekly x12 3.  Followed by breast conserving surgery on 07/13/2018: Pathologic complete response 4.  Followed by adjuvant radiation started 08/24/2018   5.  Followed by antiestrogen therapy and evaluation on [place] clinical  trial --------------------------------------------------------------------- Recommendation: Antiestrogen therapy with ovarian suppression and letrozole started 09/25/2018 discontinued December 2021 Complete estrogen blockade toxicities  I encouraged her to stay compliant with her injections and pills.  I sent a new refill for letrozole.   Surveillance: 1.  Mammogram 07/29/2020: Benign breast density category D 2.  Breast MRI was recommended but not done because of cost reasons.  3.  Breast exam  07/08/2021: Benign   Return to clinic in 1 year for follow-up    No orders of the defined types were placed in this encounter.  The patient has a good understanding of the overall plan. she agrees with it. she will call with any problems that may develop before the next visit here.  Total time spent: 20 mins including face to face time and time spent for planning, charting  and coordination of care  Rulon Eisenmenger, MD, MPH 07/08/2021  I, Thana Ates, am acting as scribe for Dr. Nicholas Lose.  I have reviewed the above documentation for accuracy and completeness, and I agree with the above.

## 2021-07-08 ENCOUNTER — Other Ambulatory Visit: Payer: Self-pay

## 2021-07-08 ENCOUNTER — Inpatient Hospital Stay: Payer: 59

## 2021-07-08 ENCOUNTER — Inpatient Hospital Stay: Payer: 59 | Attending: Hematology and Oncology | Admitting: Hematology and Oncology

## 2021-07-08 DIAGNOSIS — Z923 Personal history of irradiation: Secondary | ICD-10-CM | POA: Insufficient documentation

## 2021-07-08 DIAGNOSIS — Z95828 Presence of other vascular implants and grafts: Secondary | ICD-10-CM

## 2021-07-08 DIAGNOSIS — Z170 Estrogen receptor positive status [ER+]: Secondary | ICD-10-CM | POA: Diagnosis not present

## 2021-07-08 DIAGNOSIS — C50211 Malignant neoplasm of upper-inner quadrant of right female breast: Secondary | ICD-10-CM

## 2021-07-08 DIAGNOSIS — Z79899 Other long term (current) drug therapy: Secondary | ICD-10-CM | POA: Insufficient documentation

## 2021-07-08 MED ORDER — LETROZOLE 2.5 MG PO TABS
2.5000 mg | ORAL_TABLET | Freq: Every day | ORAL | 3 refills | Status: DC
Start: 2021-07-08 — End: 2022-11-18

## 2021-07-08 MED ORDER — GOSERELIN ACETATE 3.6 MG ~~LOC~~ IMPL
3.6000 mg | DRUG_IMPLANT | Freq: Once | SUBCUTANEOUS | Status: AC
  Administered 2021-07-08: 3.6 mg via SUBCUTANEOUS
  Filled 2021-07-08: qty 3.6

## 2021-07-08 NOTE — Patient Instructions (Signed)
Goserelin injection What is this medication? GOSERELIN (GOE se rel in) is similar to a hormone found in the body. It lowers the amount of sex hormones that the body makes. Men will have lower testosterone levels and women will have lower estrogen levels while taking this medicine. In men, this medicine is used to treat prostate cancer; the injection is either given once per month or once every 12 weeks. A once per month injection (only) is used to treat women with endometriosis, dysfunctional uterine bleeding, or advanced breast cancer. This medicine may be used for other purposes; ask your health care provider or pharmacist if you have questions. COMMON BRAND NAME(S): Zoladex What should I tell my care team before I take this medication? They need to know if you have any of these conditions: bone problems diabetes heart disease history of irregular heartbeat an unusual or allergic reaction to goserelin, other medicines, foods, dyes, or preservatives pregnant or trying to get pregnant breast-feeding How should I use this medication? This medicine is for injection under the skin. It is given by a health care professional in a hospital or clinic setting. Talk to your pediatrician regarding the use of this medicine in children. Special care may be needed. Overdosage: If you think you have taken too much of this medicine contact a poison control center or emergency room at once. NOTE: This medicine is only for you. Do not share this medicine with others. What if I miss a dose? It is important not to miss your dose. Call your doctor or health care professional if you are unable to keep an appointment. What may interact with this medication? Do not take this medicine with any of the following medications: cisapride dronedarone pimozide thioridazine This medicine may also interact with the following medications: other medicines that prolong the QT interval (an abnormal heart rhythm) This list  may not describe all possible interactions. Give your health care provider a list of all the medicines, herbs, non-prescription drugs, or dietary supplements you use. Also tell them if you smoke, drink alcohol, or use illegal drugs. Some items may interact with your medicine. What should I watch for while using this medication? Visit your doctor or health care provider for regular checks on your progress. Your symptoms may appear to get worse during the first weeks of this therapy. Tell your doctor or healthcare provider if your symptoms do not start to get better or if they get worse after this time. Your bones may get weaker if you take this medicine for a long time. If you smoke or frequently drink alcohol you may increase your risk of bone loss. A family history of osteoporosis, chronic use of drugs for seizures (convulsions), or corticosteroids can also increase your risk of bone loss. Talk to your doctor about how to keep your bones strong. This medicine should stop regular monthly menstruation in women. Tell your doctor if you continue to menstruate. Women should not become pregnant while taking this medicine or for 12 weeks after stopping this medicine. Women should inform their doctor if they wish to become pregnant or think they might be pregnant. There is a potential for serious side effects to an unborn child. Talk to your health care professional or pharmacist for more information. Do not breast-feed an infant while taking this medicine. Men should inform their doctors if they wish to father a child. This medicine may lower sperm counts. Talk to your health care professional or pharmacist for more information. This medicine may   increase blood sugar. Ask your healthcare provider if changes in diet or medicines are needed if you have diabetes. What side effects may I notice from receiving this medication? Side effects that you should report to your doctor or health care professional as soon as  possible: allergic reactions like skin rash, itching or hives, swelling of the face, lips, or tongue bone pain breathing problems changes in vision chest pain feeling faint or lightheaded, falls fever, chills pain, swelling, warmth in the leg pain, tingling, numbness in the hands or feet signs and symptoms of high blood sugar such as being more thirsty or hungry or having to urinate more than normal. You may also feel very tired or have blurry vision signs and symptoms of low blood pressure like dizziness; feeling faint or lightheaded, falls; unusually weak or tired stomach pain swelling of the ankles, feet, hands trouble passing urine or change in the amount of urine unusually high or low blood pressure unusually weak or tired Side effects that usually do not require medical attention (report to your doctor or health care professional if they continue or are bothersome): change in sex drive or performance changes in breast size in both males and females changes in emotions or moods headache hot flashes irritation at site where injected loss of appetite skin problems like acne, dry skin vaginal dryness This list may not describe all possible side effects. Call your doctor for medical advice about side effects. You may report side effects to FDA at 1-800-FDA-1088. Where should I keep my medication? This drug is given in a hospital or clinic and will not be stored at home. NOTE: This sheet is a summary. It may not cover all possible information. If you have questions about this medicine, talk to your doctor, pharmacist, or health care provider.  2022 Elsevier/Gold Standard (2019-02-04 14:05:56)  

## 2021-07-08 NOTE — Assessment & Plan Note (Signed)
01/10/2018:Palpable right breast mass with calcifications medial right breast at 1 o'clock position: 2.6 cm by ultrasound, 1 abnormal lymph node: Ultrasound biopsy of both the mass and the lymph node revealed grade 3 invasive ductal carcinoma ER 90%, PR 2%, Ki-67 70%, HER-2 negative ratio 0.96, T2 N1 stage II a AJCC 8 clinical stage  Recommendation: 1 Genetic testing: Negative.Staging scans: No metastatic disease 2.neoadjuvant chemotherapy with dose dense Adriamycin and Cytoxan followed by Taxol weekly x12 3.Followed by breast conservingsurgeryon 07/13/2018: Pathologic complete response 4.Followed by adjuvant radiationstarted 08/24/2018  5.Followed by antiestrogen therapy and evaluation on Natalee clinical trial --------------------------------------------------------------------- Recommendation: Antiestrogen therapy with ovarian suppression and letrozolestarted 09/25/2018 discontinued December 2021 Complete estrogen blockade toxicities:Occasional hot flashes but tolerating it extremely well.  Surveillance: 1.Mammogram 07/29/2020: Benign breast density category D 2.Breast MRI was recommended but not done because of cost reasons.  3.Breast exam  07/08/2021: Benign  Return to clinic in 1 year for follow-up

## 2021-08-06 ENCOUNTER — Inpatient Hospital Stay: Payer: 59 | Attending: Hematology and Oncology

## 2021-08-06 ENCOUNTER — Other Ambulatory Visit: Payer: Self-pay

## 2021-08-06 VITALS — BP 109/67 | HR 73 | Resp 18

## 2021-08-06 DIAGNOSIS — Z923 Personal history of irradiation: Secondary | ICD-10-CM | POA: Diagnosis not present

## 2021-08-06 DIAGNOSIS — Z170 Estrogen receptor positive status [ER+]: Secondary | ICD-10-CM | POA: Diagnosis present

## 2021-08-06 DIAGNOSIS — C50211 Malignant neoplasm of upper-inner quadrant of right female breast: Secondary | ICD-10-CM | POA: Insufficient documentation

## 2021-08-06 DIAGNOSIS — Z95828 Presence of other vascular implants and grafts: Secondary | ICD-10-CM

## 2021-08-06 DIAGNOSIS — Z79899 Other long term (current) drug therapy: Secondary | ICD-10-CM | POA: Insufficient documentation

## 2021-08-06 MED ORDER — GOSERELIN ACETATE 3.6 MG ~~LOC~~ IMPL
3.6000 mg | DRUG_IMPLANT | Freq: Once | SUBCUTANEOUS | Status: AC
  Administered 2021-08-06: 3.6 mg via SUBCUTANEOUS
  Filled 2021-08-06: qty 3.6

## 2021-09-06 ENCOUNTER — Inpatient Hospital Stay: Payer: 59 | Attending: Hematology and Oncology

## 2021-10-08 ENCOUNTER — Other Ambulatory Visit: Payer: Self-pay

## 2021-10-08 ENCOUNTER — Inpatient Hospital Stay: Payer: 59 | Attending: Hematology and Oncology

## 2021-10-08 VITALS — BP 117/70 | HR 89 | Temp 97.7°F | Resp 18

## 2021-10-08 DIAGNOSIS — Z170 Estrogen receptor positive status [ER+]: Secondary | ICD-10-CM

## 2021-10-08 DIAGNOSIS — Z95828 Presence of other vascular implants and grafts: Secondary | ICD-10-CM

## 2021-10-08 DIAGNOSIS — Z923 Personal history of irradiation: Secondary | ICD-10-CM | POA: Diagnosis not present

## 2021-10-08 DIAGNOSIS — Z79899 Other long term (current) drug therapy: Secondary | ICD-10-CM | POA: Insufficient documentation

## 2021-10-08 DIAGNOSIS — C50211 Malignant neoplasm of upper-inner quadrant of right female breast: Secondary | ICD-10-CM | POA: Diagnosis not present

## 2021-10-08 MED ORDER — GOSERELIN ACETATE 3.6 MG ~~LOC~~ IMPL
3.6000 mg | DRUG_IMPLANT | Freq: Once | SUBCUTANEOUS | Status: AC
  Administered 2021-10-08: 3.6 mg via SUBCUTANEOUS
  Filled 2021-10-08: qty 3.6

## 2021-10-28 ENCOUNTER — Encounter: Payer: Self-pay | Admitting: Hematology and Oncology

## 2021-11-01 ENCOUNTER — Encounter: Payer: Self-pay | Admitting: Hematology and Oncology

## 2021-11-05 ENCOUNTER — Encounter: Payer: Self-pay | Admitting: Hematology and Oncology

## 2021-11-08 ENCOUNTER — Inpatient Hospital Stay: Payer: 59 | Attending: Hematology and Oncology

## 2021-11-08 ENCOUNTER — Other Ambulatory Visit: Payer: Self-pay

## 2021-11-08 VITALS — BP 117/70 | HR 95 | Temp 98.0°F | Resp 18

## 2021-11-08 DIAGNOSIS — Z79899 Other long term (current) drug therapy: Secondary | ICD-10-CM | POA: Diagnosis not present

## 2021-11-08 DIAGNOSIS — Z923 Personal history of irradiation: Secondary | ICD-10-CM | POA: Diagnosis not present

## 2021-11-08 DIAGNOSIS — C50211 Malignant neoplasm of upper-inner quadrant of right female breast: Secondary | ICD-10-CM | POA: Diagnosis present

## 2021-11-08 DIAGNOSIS — Z170 Estrogen receptor positive status [ER+]: Secondary | ICD-10-CM | POA: Diagnosis present

## 2021-11-08 DIAGNOSIS — Z95828 Presence of other vascular implants and grafts: Secondary | ICD-10-CM

## 2021-11-08 MED ORDER — GOSERELIN ACETATE 3.6 MG ~~LOC~~ IMPL
3.6000 mg | DRUG_IMPLANT | Freq: Once | SUBCUTANEOUS | Status: AC
  Administered 2021-11-08: 3.6 mg via SUBCUTANEOUS
  Filled 2021-11-08: qty 3.6

## 2021-12-10 ENCOUNTER — Other Ambulatory Visit: Payer: Self-pay

## 2021-12-10 ENCOUNTER — Encounter: Payer: Self-pay | Admitting: Hematology and Oncology

## 2021-12-10 ENCOUNTER — Inpatient Hospital Stay: Payer: 59 | Attending: Hematology and Oncology

## 2021-12-10 VITALS — BP 113/68 | HR 90 | Temp 99.0°F | Resp 18

## 2021-12-10 DIAGNOSIS — Z79899 Other long term (current) drug therapy: Secondary | ICD-10-CM | POA: Insufficient documentation

## 2021-12-10 DIAGNOSIS — Z170 Estrogen receptor positive status [ER+]: Secondary | ICD-10-CM | POA: Diagnosis present

## 2021-12-10 DIAGNOSIS — Z95828 Presence of other vascular implants and grafts: Secondary | ICD-10-CM

## 2021-12-10 DIAGNOSIS — C50211 Malignant neoplasm of upper-inner quadrant of right female breast: Secondary | ICD-10-CM | POA: Diagnosis present

## 2021-12-10 DIAGNOSIS — Z923 Personal history of irradiation: Secondary | ICD-10-CM | POA: Diagnosis not present

## 2021-12-10 MED ORDER — GOSERELIN ACETATE 3.6 MG ~~LOC~~ IMPL
3.6000 mg | DRUG_IMPLANT | Freq: Once | SUBCUTANEOUS | Status: AC
  Administered 2021-12-10: 3.6 mg via SUBCUTANEOUS
  Filled 2021-12-10: qty 3.6

## 2022-01-07 ENCOUNTER — Inpatient Hospital Stay: Payer: 59 | Attending: Hematology and Oncology

## 2022-01-07 ENCOUNTER — Other Ambulatory Visit: Payer: Self-pay

## 2022-01-07 VITALS — BP 108/71 | HR 84 | Temp 98.2°F | Resp 18

## 2022-01-07 DIAGNOSIS — C50211 Malignant neoplasm of upper-inner quadrant of right female breast: Secondary | ICD-10-CM | POA: Diagnosis present

## 2022-01-07 DIAGNOSIS — Z79899 Other long term (current) drug therapy: Secondary | ICD-10-CM | POA: Diagnosis not present

## 2022-01-07 DIAGNOSIS — Z170 Estrogen receptor positive status [ER+]: Secondary | ICD-10-CM | POA: Insufficient documentation

## 2022-01-07 DIAGNOSIS — Z923 Personal history of irradiation: Secondary | ICD-10-CM | POA: Insufficient documentation

## 2022-01-07 DIAGNOSIS — Z95828 Presence of other vascular implants and grafts: Secondary | ICD-10-CM

## 2022-01-07 MED ORDER — GOSERELIN ACETATE 3.6 MG ~~LOC~~ IMPL
3.6000 mg | DRUG_IMPLANT | Freq: Once | SUBCUTANEOUS | Status: AC
  Administered 2022-01-07: 3.6 mg via SUBCUTANEOUS
  Filled 2022-01-07: qty 3.6

## 2022-01-31 ENCOUNTER — Encounter (HOSPITAL_COMMUNITY): Payer: Self-pay

## 2022-01-31 ENCOUNTER — Ambulatory Visit (HOSPITAL_COMMUNITY)
Admission: EM | Admit: 2022-01-31 | Discharge: 2022-01-31 | Disposition: A | Discharge status: Home / Self Care | Payer: 59 | Attending: Family Medicine | Admitting: Family Medicine

## 2022-01-31 DIAGNOSIS — R42 Dizziness and giddiness: Secondary | ICD-10-CM | POA: Insufficient documentation

## 2022-01-31 DIAGNOSIS — Z853 Personal history of malignant neoplasm of breast: Secondary | ICD-10-CM | POA: Insufficient documentation

## 2022-01-31 DIAGNOSIS — R519 Headache, unspecified: Secondary | ICD-10-CM | POA: Insufficient documentation

## 2022-01-31 LAB — COMPREHENSIVE METABOLIC PANEL
ALT: 17 U/L (ref 0–44)
AST: 19 U/L (ref 15–41)
Albumin: 3.7 g/dL (ref 3.5–5.0)
Alkaline Phosphatase: 75 U/L (ref 38–126)
Anion gap: 9 (ref 5–15)
BUN: 7 mg/dL (ref 6–20)
CO2: 27 mmol/L (ref 22–32)
Calcium: 9.1 mg/dL (ref 8.9–10.3)
Chloride: 102 mmol/L (ref 98–111)
Creatinine, Ser: 0.75 mg/dL (ref 0.44–1.00)
GFR, Estimated: >60 mL/min (ref >60)
Glucose, Bld: 102 mg/dL — ABNORMAL HIGH (ref 70–99)
Potassium: 3.6 mmol/L (ref 3.5–5.1)
Sodium: 138 mmol/L (ref 135–145)
Total Bilirubin: 0.4 mg/dL (ref 0.3–1.2)
Total Protein: 7.5 g/dL (ref 6.5–8.1)

## 2022-01-31 LAB — CBC
HCT: 38.8 % (ref 36.0–46.0)
Hemoglobin: 12.4 g/dL (ref 12.0–15.0)
MCH: 21.8 pg — ABNORMAL LOW (ref 26.0–34.0)
MCHC: 32 g/dL (ref 30.0–36.0)
MCV: 68.1 fL — ABNORMAL LOW (ref 80.0–100.0)
Platelets: 305 10*3/uL (ref 150–400)
RBC: 5.7 MIL/uL — ABNORMAL HIGH (ref 3.87–5.11)
RDW: 15.2 % (ref 11.5–15.5)
WBC: 5.8 10*3/uL (ref 4.0–10.5)
nRBC: 0 % (ref 0.0–0.2)

## 2022-01-31 MED ORDER — KETOROLAC TROMETHAMINE 30 MG/ML IJ SOLN
30.0000 mg | Freq: Once | INTRAMUSCULAR | Status: AC
  Administered 2022-01-31: 30 mg via INTRAMUSCULAR

## 2022-01-31 MED ORDER — MECLIZINE HCL 25 MG PO TABS: 25.0000 mg | ORAL_TABLET | Freq: Three times a day (TID) | ORAL | 0 refills | Status: AC | PRN

## 2022-01-31 MED ORDER — KETOROLAC TROMETHAMINE 30 MG/ML IJ SOLN
INTRAMUSCULAR | Status: AC
  Filled 2022-01-31: qty 1

## 2022-01-31 NOTE — ED Provider Notes (Addendum)
?[name] ? ? ? ?CSN: [id number] ?Arrival date & time: 01/31/22  1724 ? ? ?  ? ?History   ?Chief Complaint ?Chief Complaint  ?Patient presents with  ? Dizziness  ? Headache  ? ? ?HPI ?Kim Griffin is a 26 y.o. female.  ? ? ?Dizziness ?Associated symptoms: headaches   ?Headache ?Associated symptoms: dizziness   ?Here for dizziness and headache that began this afternoon. 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  She also noticed a sharp headache in her frontal area that began then.  She has never had migraines previously.  She has felt a little nauseated with that. ? ? ?She reports a history of anemia, however she has not had blood work since about 2019.  She does get Zoladex infusions once monthly, and her last one was March 10.  She has a history of breast cancer that was diagnosed in 2018 and she received chemo, radiation, and surgery. ? ? ? ? ?Past Medical History:  ?Diagnosis Date  ? Anemia   ? takes iron supplement  ? Breast cancer, right ([place]) 12/2017  ? History of chemotherapy   ? finished chemo 06/12/2018  ? Personal history of chemotherapy   ? Personal history of radiation therapy   ? Sickle cell trait ([place])   ? ? ?Patient Active Problem List  ? Diagnosis Date Noted  ? Port-A-Cath in place 03/28/2018  ? Genetic testing 02/08/2018  ? Malignant neoplasm of upper-inner quadrant of right breast in female, estrogen receptor positive ([place]) 01/16/2018  ? ? ?Past Surgical History:  ?Procedure Laterality Date  ? BREAST LUMPECTOMY Right   ? BREAST LUMPECTOMY WITH RADIOACTIVE SEED AND SENTINEL LYMPH NODE BIOPSY Right 07/13/2018  ? Procedure: RIGHT BREAST LUMPECTOMY X 2 WITH RADIOACTIVE SEED X 2, RIGHT SEED TARGETED AXILLARY LYMPH NODE EXCISION AND RIGHT SENTINEL LYMPH NODE BIOPSY;  Surgeon: Alphonsa Overall, MD;  Location: [facility];  Service: General;  Laterality: Right;  ? PORT-A-CATH REMOVAL Right 07/13/2018  ? Procedure: REMOVAL  PORT-A-CATH;  Surgeon: Alphonsa Overall, MD;  Location: [facility];  Service: General;  Laterality: Right;  ? PORTACATH PLACEMENT N/A 01/22/2018  ? Procedure: INSERTION PORT-A-CATH WITH ULTRASOUND ERAS PATHWAY;  Surgeon: Alphonsa Overall, MD;  Location: [facility];  Service: General;  Laterality: N/A;  ? WISDOM TOOTH EXTRACTION    ? ? ?OB History   ?No obstetric history on file. ?  ? ? ? ?Home Medications   ? ?Prior to Admission medications   ?Medication Sig Start Date End Date Taking? Authorizing Provider  ?meclizine (ANTIVERT) 25 MG tablet Take 1 tablet (25 mg total) by mouth 3 (three) times daily as needed for dizziness. 01/31/22  Yes Barrett Henle, MD  ?ferrous sulfate 325 (65 FE) MG tablet Take 325 mg by mouth daily with breakfast.    [provider]  ?letrozole (FEMARA) 2.5 MG tablet Take 1 tablet (2.5 mg total) by mouth daily. 07/08/21   Nicholas Lose, MD  ? ? ?Family History ?Family History  ?Problem Relation Age of Onset  ? Lung cancer Other   ?     2 of maternal grandmother's siblings  ? Breast cancer Cousin   ?     2nd cousin  ? ? ?Social History ?Social History  ? ?Tobacco Use  ? Smoking status: Never  ? Smokeless tobacco: Never  ?Vaping Use  ? Vaping Use: Never used  ?Substance Use Topics  ?  Alcohol use: No  ? Drug use: No  ? ? ? ?Allergies   ?Patient has no known allergies. ? ? ?Review of Systems ?Review of Systems  ?Neurological:  Positive for dizziness and headaches.  ? ? ?Physical Exam ?Triage Vital Signs ?ED Triage Vitals  ?Enc Vitals Group  ?   BP 01/31/22 1734 114/69  ?   Pulse Rate 01/31/22 1734 93  ?   Resp 01/31/22 1734 16  ?   Temp 01/31/22 1734 98.2 ?F (36.8 ?C)  ?   Temp Source 01/31/22 1734 Oral  ?   SpO2 01/31/22 1734 100 %  ?   Weight --   ?   Height --   ?   Head Circumference --   ?   Peak Flow --   ?   Pain Score 01/31/22 1731 3  ?   Pain Loc --   ?   Pain Edu? --   ?   Excl. in [place]? --   ? ?No data found. ? ?Updated Vital Signs ?BP 114/69 (BP Location: Right Arm)   Pulse  93   Temp 98.2 ?F (36.8 ?C) (Oral)   Resp 16   SpO2 100%  ? ?Visual Acuity ?Right Eye Distance:   ?Left Eye Distance:   ?Bilateral Distance:   ? ?Right Eye Near:   ?Left Eye Near:    ?Bilateral Near:    ? ?Physical Exam ?Constitutional:   ?   General: She is not in acute distress. ?   Appearance: She is not ill-appearing, toxic-appearing or diaphoretic.  ?HENT:  ?   Right Ear: Tympanic membrane and ear canal normal.  ?   Left Ear: Tympanic membrane and ear canal normal.  ?   Nose: Nose normal.  ?   Mouth/Throat:  ?   Mouth: Mucous membranes are moist.  ?   Pharynx: No oropharyngeal exudate or posterior oropharyngeal erythema.  ?Eyes:  ?   Extraocular Movements: Extraocular movements intact.  ?   Conjunctiva/sclera: Conjunctivae normal.  ?   Pupils: Pupils are equal, round, and reactive to light.  ?Cardiovascular:  ?   Rate and Rhythm: Normal rate and regular rhythm.  ?   Heart sounds: No murmur heard. ?Pulmonary:  ?   Effort: Pulmonary effort is normal.  ?   Breath sounds: Normal breath sounds.  ?Musculoskeletal:  ?   Cervical back: Neck supple.  ?Lymphadenopathy:  ?   Cervical: No cervical adenopathy.  ?Skin: ?   Capillary Refill: Capillary refill takes less than 2 seconds.  ?   Coloration: Skin is not jaundiced or pale.  ?Neurological:  ?   General: No focal deficit present.  ?   Mental Status: She is alert and oriented to person, place, and time.  ?Psychiatric:     ?   Behavior: Behavior normal.  ? ? ? ?UC Treatments / Results  ?Labs ?(all labs ordered are listed, but only abnormal results are displayed) ?Labs Reviewed  ?CBC  ?COMPREHENSIVE METABOLIC PANEL  ? ? ?EKG ? ? ?Radiology ?No results found. ? ?Procedures ?Procedures (including critical care time) ? ?Medications Ordered in UC ?Medications  ?ketorolac (TORADOL) 30 MG/ML injection 30 mg (30 mg Intramuscular Given 01/31/22 1944)  ? ? ?Initial Impression / Assessment and Plan / UC Course  ?I have reviewed the triage vital signs and the nursing  notes. ? ?Pertinent labs & imaging results that were available during my care of the patient were reviewed by me and considered in my medical decision making (  see chart for details). ? ?  ? ?We will treat the headache with Toradol and the vertigo with meclizine.  Basic labs done today since she is at risk for anemia and other abnormalities with her history of cancer.  She will follow-up with her PCP ?Final Clinical Impressions(s) / UC Diagnoses  ? ?Final diagnoses:  ?Vertigo  ?Nonintractable headache, unspecified chronicity pattern, unspecified headache type  ?History of breast cancer  ? ? ? ?Discharge Instructions   ? ?  ?You have been given a shot of Toradol 30 mg here in the office for pain ? ?Take meclizine 25 mg--1 tab 3 times daily as needed for vertigo and dizziness ? ?We have drawn a blood count and a chemistry panel today; staff will call you if anything is significantly abnormal ? ?Please make an appointment with your primary care office ? ? ? ? ?ED Prescriptions   ? ? Medication Sig Dispense Auth. Provider  ? meclizine (ANTIVERT) 25 MG tablet Take 1 tablet (25 mg total) by mouth 3 (three) times daily as needed for dizziness. 30 tablet Barrett Henle, MD  ? ?  ? ?PDMP not reviewed this encounter. ?  ?Barrett Henle, MD ?01/31/22 1925 ? ?  ?Barrett Henle, MD ?01/31/22 1949 ? ?

## 2022-01-31 NOTE — ED Triage Notes (Signed)
Pt presents with c/o a headache.  ? 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 Pt states she feels she will faint.  ? ?Pt states she feels slight nausea. States she took medicine to reduce nausea.  ? ?Pt states last time she felt nausea was only during cancer treatment. Pt reports last taking her iron pills two months ago.  ?

## 2022-01-31 NOTE — Discharge Instructions (Addendum)
You have been given a shot of Toradol 30 mg here in the office for pain ? ?Take meclizine 25 mg--1 tab 3 times daily as needed for vertigo and dizziness ? ?We have drawn a blood count and a chemistry panel today; staff will call you if anything is significantly abnormal ? ?Please make an appointment with your primary care office ?

## 2022-02-07 ENCOUNTER — Inpatient Hospital Stay: Payer: 59 | Attending: Hematology and Oncology

## 2022-02-07 ENCOUNTER — Other Ambulatory Visit: Payer: Self-pay

## 2022-02-07 VITALS — BP 111/74 | HR 88 | Temp 98.9°F | Resp 16

## 2022-02-07 DIAGNOSIS — Z170 Estrogen receptor positive status [ER+]: Secondary | ICD-10-CM | POA: Diagnosis present

## 2022-02-07 DIAGNOSIS — Z923 Personal history of irradiation: Secondary | ICD-10-CM | POA: Insufficient documentation

## 2022-02-07 DIAGNOSIS — C50211 Malignant neoplasm of upper-inner quadrant of right female breast: Secondary | ICD-10-CM | POA: Insufficient documentation

## 2022-02-07 DIAGNOSIS — Z79899 Other long term (current) drug therapy: Secondary | ICD-10-CM | POA: Diagnosis not present

## 2022-02-07 DIAGNOSIS — Z95828 Presence of other vascular implants and grafts: Secondary | ICD-10-CM

## 2022-02-07 MED ORDER — GOSERELIN ACETATE 3.6 MG ~~LOC~~ IMPL
3.6000 mg | DRUG_IMPLANT | Freq: Once | SUBCUTANEOUS | Status: AC
  Administered 2022-02-07: 3.6 mg via SUBCUTANEOUS
  Filled 2022-02-07: qty 3.6

## 2022-03-11 ENCOUNTER — Inpatient Hospital Stay: Payer: 59 | Attending: Hematology and Oncology

## 2022-03-14 ENCOUNTER — Telehealth: Payer: Self-pay | Admitting: Hematology and Oncology

## 2022-03-14 NOTE — Telephone Encounter (Signed)
Tried to reschedule missed 05/12 appointment, called multiple times and left a voicemail.  ?

## 2022-04-05 ENCOUNTER — Encounter: Payer: Self-pay | Admitting: Hematology and Oncology

## 2022-04-05 ENCOUNTER — Other Ambulatory Visit: Payer: Self-pay | Admitting: *Deleted

## 2022-04-05 DIAGNOSIS — Z170 Estrogen receptor positive status [ER+]: Secondary | ICD-10-CM

## 2022-04-11 ENCOUNTER — Inpatient Hospital Stay: Payer: 59 | Attending: Hematology and Oncology

## 2022-04-11 ENCOUNTER — Other Ambulatory Visit: Payer: Self-pay

## 2022-04-11 VITALS — BP 112/76 | HR 87 | Temp 97.7°F | Resp 18

## 2022-04-11 DIAGNOSIS — Z95828 Presence of other vascular implants and grafts: Secondary | ICD-10-CM

## 2022-04-11 DIAGNOSIS — Z170 Estrogen receptor positive status [ER+]: Secondary | ICD-10-CM | POA: Insufficient documentation

## 2022-04-11 DIAGNOSIS — Z79899 Other long term (current) drug therapy: Secondary | ICD-10-CM | POA: Insufficient documentation

## 2022-04-11 DIAGNOSIS — C50211 Malignant neoplasm of upper-inner quadrant of right female breast: Secondary | ICD-10-CM | POA: Insufficient documentation

## 2022-04-11 DIAGNOSIS — Z923 Personal history of irradiation: Secondary | ICD-10-CM | POA: Diagnosis not present

## 2022-04-11 MED ORDER — GOSERELIN ACETATE 3.6 MG ~~LOC~~ IMPL
3.6000 mg | DRUG_IMPLANT | Freq: Once | SUBCUTANEOUS | Status: AC
  Administered 2022-04-11: 3.6 mg via SUBCUTANEOUS
  Filled 2022-04-11: qty 3.6

## 2022-04-20 ENCOUNTER — Ambulatory Visit (INDEPENDENT_AMBULATORY_CARE_PROVIDER_SITE_OTHER): Payer: 59

## 2022-04-20 ENCOUNTER — Ambulatory Visit (HOSPITAL_COMMUNITY)
Admission: EM | Admit: 2022-04-20 | Discharge: 2022-04-20 | Disposition: A | Discharge status: Home / Self Care | Payer: 59 | Attending: Emergency Medicine | Admitting: Emergency Medicine

## 2022-04-20 ENCOUNTER — Encounter: Payer: Self-pay | Admitting: Hematology and Oncology

## 2022-04-20 ENCOUNTER — Encounter (HOSPITAL_COMMUNITY): Payer: Self-pay | Admitting: Emergency Medicine

## 2022-04-20 DIAGNOSIS — M25572 Pain in left ankle and joints of left foot: Secondary | ICD-10-CM

## 2022-04-20 NOTE — ED Provider Notes (Signed)
[facility]    CSN: [id number] Arrival date & time: 04/20/22  1209     History   Chief Complaint Chief Complaint  Patient presents with   Ankle Injury    HPI Kim Griffin is a 26 y.o. female.  Presents after twisting her left ankle 3 days ago. Some mild 4/10 pain with walking. Has been using ankle brace and elevating the leg. Has not tried ice or antiinflammatory medicine. Full ROM of the ankle, some swelling to the outer ankle.  History of breast cancer for which she received chemotherapy in 2019. Takes Femara.  Past Medical History:  Diagnosis Date   Anemia    takes iron supplement   Breast cancer, right ([place]) 12/2017   History of chemotherapy    finished chemo 06/12/2018   Personal history of chemotherapy    Personal history of radiation therapy    Sickle cell trait [facility])     Patient Active Problem List   Diagnosis Date Noted   Port-A-Cath in place 03/28/2018   Genetic testing 02/08/2018   Malignant neoplasm of upper-inner quadrant of right breast in female, estrogen receptor positive ([place]) 01/16/2018    Past Surgical History:  Procedure Laterality Date   BREAST LUMPECTOMY Right    BREAST LUMPECTOMY WITH RADIOACTIVE SEED AND SENTINEL LYMPH NODE BIOPSY Right 07/13/2018   Procedure: RIGHT BREAST LUMPECTOMY X 2 WITH RADIOACTIVE SEED X 2, RIGHT SEED TARGETED AXILLARY LYMPH NODE EXCISION AND RIGHT SENTINEL LYMPH NODE BIOPSY;  Surgeon: Alphonsa Overall, MD;  Location: [place];  Service: General;  Laterality: Right;   PORT-A-CATH REMOVAL Right 07/13/2018   Procedure: REMOVAL PORT-A-CATH;  Surgeon: Alphonsa Overall, MD;  Location: [place];  Service: General;  Laterality: Right;   PORTACATH PLACEMENT N/A 01/22/2018   Procedure: [place];  Surgeon: Alphonsa Overall, MD;  Location: [place];  Service: General;  Laterality: N/A;   WISDOM TOOTH EXTRACTION      OB History   No obstetric history  on file.      Home Medications    Prior to Admission medications   Medication Sig Start Date End Date Taking? Authorizing Provider  letrozole (FEMARA) 2.5 MG tablet Take 1 tablet (2.5 mg total) by mouth daily. 07/08/21  Yes Nicholas Lose, MD  ferrous sulfate 325 (65 FE) MG tablet Take 325 mg by mouth daily with breakfast.    [provider]  meclizine (ANTIVERT) 25 MG tablet Take 1 tablet (25 mg total) by mouth 3 (three) times daily as needed for dizziness. 01/31/22   Barrett Henle, MD    Family History Family History  Problem Relation Age of Onset   Lung cancer Other        2 of maternal grandmother's siblings   Breast cancer Cousin        2nd cousin    Social History Social History   Tobacco Use   Smoking status: Never   Smokeless tobacco: Never  Vaping Use   Vaping Use: Never used  Substance Use Topics   Alcohol use: No   Drug use: No     Allergies   Patient has no known allergies.   Review of Systems Review of Systems  Per HPI  Physical Exam Triage Vital Signs ED Triage Vitals  Enc Vitals Group     BP 04/20/22 1220 120/70     Pulse Rate 04/20/22 1220 86     Resp 04/20/22 1220 16  Temp 04/20/22 1220 97.9 F (36.6 C)     Temp Source 04/20/22 1220 Oral     SpO2 04/20/22 1220 100 %     Weight --      Height --      Head Circumference --      Peak Flow --      Pain Score 04/20/22 1224 4     Pain Loc --      Pain Edu? --      Excl. in [place]? --    No data found.  Updated Vital Signs BP 120/70 (BP Location: Left Arm)   Pulse 86   Temp 97.9 F (36.6 C) (Oral)   Resp 16   LMP  (LMP Unknown) Comment: " don't have one due to hormone suppression treatment".  SpO2 100%    Physical Exam Vitals and nursing note reviewed.  Constitutional:      General: She is not in acute distress.    Appearance: Normal appearance.  HENT:     Mouth/Throat:     Mouth: Mucous membranes are moist.     Pharynx: Oropharynx is clear.  Eyes:      Conjunctiva/sclera: Conjunctivae normal.  Cardiovascular:     Rate and Rhythm: Normal rate and regular rhythm.     Pulses: Normal pulses.     Heart sounds: Normal heart sounds.  Pulmonary:     Effort: Pulmonary effort is normal.     Breath sounds: Normal breath sounds.  Musculoskeletal:        General: Normal range of motion.     Cervical back: Normal range of motion.     Left ankle: Swelling present. No deformity. Tenderness present over the lateral malleolus. Normal range of motion.     Comments: Mild swelling to lateral left ankle, some tenderness to palpation  Skin:    General: Skin is warm and dry.  Neurological:     Mental Status: She is alert and oriented to person, place, and time.     UC Treatments / Results  Labs (all labs ordered are listed, but only abnormal results are displayed) Labs Reviewed - No data to display  EKG  Radiology DG Ankle Complete Left  Result Date: 04/20/2022 CLINICAL DATA:  Ankle injury which occurred 2-3 days ago. EXAM: LEFT ANKLE COMPLETE - 3+ VIEW COMPARISON:  None Available. FINDINGS: There is no evidence of fracture, dislocation, or joint effusion. There is no evidence of arthropathy or other focal bone abnormality. Soft tissues are unremarkable. IMPRESSION: Negative. Electronically Signed   By: Kerby Moors M.D.   On: 04/20/2022 12:52    Procedures Procedures  Medications Ordered in UC Medications - No data to display  Initial Impression / Assessment and Plan / UC Course  I have reviewed the triage vital signs and the nursing notes.  Pertinent labs & imaging results that were available during my care of the patient were reviewed by me and considered in my medical decision making (see chart for details).  Left ankle xray obtained is negative. Applied ace wrap in clinic for compression. RICE therapy. Ibuprofen every 6 hours for pain. Follow up with ortho if symptoms persist. Return precautions discussed. Patient agrees to plan and is  discharged in stable condition.  Final Clinical Impressions(s) / UC Diagnoses   Final diagnoses:  Acute left ankle pain     Discharge Instructions      You can use the ACE bandage for compression. Continue to elevate the leg. Apply ice to the area  for swelling and pain. You can try ibuprofen every 6 hours for pain.  Follow up with the orthopedic clinic if your symptoms are not improving.     ED Prescriptions   None    PDMP not reviewed this encounter.   Namrata Dangler, Vernice Jefferson 04/20/22 1315

## 2022-04-20 NOTE — Discharge Instructions (Addendum)
You can use the ACE bandage for compression. Continue to elevate the leg. Apply ice to the area for swelling and pain. You can try ibuprofen every 6 hours for pain.  Follow up with the orthopedic clinic if your symptoms are not improving.

## 2022-04-20 NOTE — ED Triage Notes (Signed)
Patient c/o LFT ankle injury that occurred 2-3 days ago.   Patient states " one night, I slipped on my dogs blanket and I slipped backwards on my ankle".   Patient endorses pain with ambulation.   Patient has used a brace and elevation with some relief of symptoms.

## 2022-05-13 ENCOUNTER — Inpatient Hospital Stay: Payer: 59 | Attending: Hematology and Oncology

## 2022-05-13 ENCOUNTER — Other Ambulatory Visit: Payer: Self-pay

## 2022-05-13 VITALS — BP 111/72 | HR 74 | Temp 98.5°F | Resp 16

## 2022-05-13 DIAGNOSIS — Z923 Personal history of irradiation: Secondary | ICD-10-CM | POA: Insufficient documentation

## 2022-05-13 DIAGNOSIS — Z170 Estrogen receptor positive status [ER+]: Secondary | ICD-10-CM | POA: Insufficient documentation

## 2022-05-13 DIAGNOSIS — Z79899 Other long term (current) drug therapy: Secondary | ICD-10-CM | POA: Diagnosis not present

## 2022-05-13 DIAGNOSIS — C50211 Malignant neoplasm of upper-inner quadrant of right female breast: Secondary | ICD-10-CM | POA: Insufficient documentation

## 2022-05-13 DIAGNOSIS — Z95828 Presence of other vascular implants and grafts: Secondary | ICD-10-CM

## 2022-05-13 MED ORDER — GOSERELIN ACETATE 3.6 MG ~~LOC~~ IMPL
3.6000 mg | DRUG_IMPLANT | Freq: Once | SUBCUTANEOUS | Status: AC
  Administered 2022-05-13: 3.6 mg via SUBCUTANEOUS
  Filled 2022-05-13: qty 3.6

## 2022-06-13 ENCOUNTER — Inpatient Hospital Stay: Payer: Commercial Managed Care - HMO | Attending: Hematology and Oncology

## 2022-06-13 DIAGNOSIS — C50211 Malignant neoplasm of upper-inner quadrant of right female breast: Secondary | ICD-10-CM | POA: Insufficient documentation

## 2022-06-13 DIAGNOSIS — Z170 Estrogen receptor positive status [ER+]: Secondary | ICD-10-CM | POA: Insufficient documentation

## 2022-06-16 ENCOUNTER — Telehealth: Payer: Self-pay | Admitting: Hematology and Oncology

## 2022-06-16 NOTE — Telephone Encounter (Signed)
Per 8/17 phone line pt called to r/s missed injection

## 2022-06-20 ENCOUNTER — Inpatient Hospital Stay: Payer: Commercial Managed Care - HMO

## 2022-06-24 ENCOUNTER — Telehealth: Payer: Self-pay | Admitting: Hematology and Oncology

## 2022-06-24 NOTE — Telephone Encounter (Signed)
Contacted patient to schedule missed appointments. Scheduled appointment per patient request. Patient is aware of appointment details.

## 2022-06-27 ENCOUNTER — Other Ambulatory Visit: Payer: Self-pay

## 2022-06-27 ENCOUNTER — Inpatient Hospital Stay: Payer: Commercial Managed Care - HMO

## 2022-06-27 DIAGNOSIS — Z95828 Presence of other vascular implants and grafts: Secondary | ICD-10-CM

## 2022-06-27 DIAGNOSIS — C50211 Malignant neoplasm of upper-inner quadrant of right female breast: Secondary | ICD-10-CM | POA: Diagnosis not present

## 2022-06-27 DIAGNOSIS — Z170 Estrogen receptor positive status [ER+]: Secondary | ICD-10-CM | POA: Diagnosis present

## 2022-06-27 MED ORDER — GOSERELIN ACETATE 3.6 MG ~~LOC~~ IMPL
3.6000 mg | DRUG_IMPLANT | Freq: Once | SUBCUTANEOUS | Status: AC
  Administered 2022-06-27: 3.6 mg via SUBCUTANEOUS
  Filled 2022-06-27: qty 3.6

## 2022-07-15 ENCOUNTER — Ambulatory Visit: Payer: 59

## 2022-07-15 ENCOUNTER — Ambulatory Visit: Payer: 59 | Admitting: Hematology and Oncology

## 2022-07-23 NOTE — Progress Notes (Signed)
Patient Care Team: Suella Broad, FNP as PCP - General (Nurse Practitioner) Nicholas Lose, MD as Consulting Physician (Hematology and Oncology) Alphonsa Overall, MD as Consulting Physician (General Surgery) Gery Pray, MD as Consulting Physician (Radiation Oncology)  DIAGNOSIS: No diagnosis found.  SUMMARY OF ONCOLOGIC HISTORY: Oncology History  Malignant neoplasm of upper-inner quadrant of right breast in female, estrogen receptor positive ([place])  01/10/2018 Initial Diagnosis   Palpable right breast mass with calcifications medial right breast at 1 o'clock position: 2.6 cm by ultrasound, 1 abnormal lymph node: Ultrasound biopsy of both the mass and the lymph node revealed grade 3 invasive ductal carcinoma ER 90%, PR 2%, Ki-67 70%, HER-2 negative ratio 0.96, T2 N1 stage II a AJCC 8 clinical stage   01/17/2018 Cancer Staging   Staging form: Breast, AJCC 8th Edition - Clinical stage from 01/17/2018: Stage IIB (cT2, cN1, cM0, G3, ER+, PR+, HER2-) - Signed by Gardenia Phlegm, NP on 06/20/2018   01/29/2018 - 06/12/2018 Neo-Adjuvant Chemotherapy   Dose dense Adriamycin and Cytoxan followed by Taxol x12  Zoladex started with chemo   02/06/2018 Genetic Testing   UPDATE: CDK4 c.[id number] (Silent) VUS was amended to likely benign due to a re-review of the evidence in light of new variant interpretation guidelines and/or new information.  Multi-Cancer panel (83 genes) @ [facility] - No pathogenic mutations detected Variant of Uncertain Significance in CDK4  Genes Analyzed: 83 genes on [facility]'s Multi-Cancer panel (ALK, APC, ATM, AXIN2, BAP1, BARD1, BLM, BMPR1A, BRCA1, BRCA2, BRIP1, CASR, CDC73, CDH1, CDK4, CDKN1B, CDKN1C, CDKN2A, CEBPA, CHEK2, CTNNA1, DICER1, DIS3L2, EGFR, EPCAM, FH, FLCN, GATA2, GPC3, GREM1, HOXB13, HRAS, KIT, MAX, MEN1, MET, MITF, MLH1, MSH2, MSH3, MSH6, MUTYH, NBN, NF1, NF2, NTHL1, PALB2, PDGFRA, PHOX2B, PMS2, POLD1, POLE, POT1, PRKAR1A, PTCH1, PTEN, RAD50, RAD51C, RAD51D,  RB1, RECQL4, RET, RUNX1, SDHA, SDHAF2, SDHB, SDHC, SDHD, SMAD4, SMARCA4, SMARCB1, SMARCE1, STK11, SUFU, TERC, TERT, TMEM127, TP53, TSC1, TSC2, VHL, WRN, WT1).   02/27/2018 Miscellaneous   Genetics negative   07/13/2018 Surgery   Right lumpectomy: Pathologic complete response 03 lymph nodes negative   08/23/2018 - 10/09/2018 Radiation Therapy   1. Right axilla; 1.8 Gy in 25 fractions for a total of 45 Gy                      2. Right breast; 1.8 Gy in 28 fractions for a total of 50.4 Gy                      3. Lumpectomy Boost; 2 Gy in 5 fractions for a total of 10 Gy   09/2018 -  Anti-estrogen oral therapy   Letrozole     CHIEF COMPLIANT: Follow-up of right breast cancer on letrozole    INTERVAL HISTORY: Kim Griffin is a   26 y.o. with above-mentioned history of right breast cancer who underwent neoadjuvant chemotherapy with complete response, followed by lumpectomy, radiation, and who is currently on anti-estrogen therapy with letrozole and receives Zoladex injections. She presents to the clinic today for follow-up.   ALLERGIES:  has No Known Allergies.  MEDICATIONS:  Current Outpatient Medications  Medication Sig Dispense Refill   ferrous sulfate 325 (65 FE) MG tablet Take 325 mg by mouth daily with breakfast.     letrozole (FEMARA) 2.5 MG tablet Take 1 tablet (2.5 mg total) by mouth daily. 90 tablet 3   meclizine (ANTIVERT) 25 MG tablet Take 1 tablet (25 mg total) by mouth 3 (three) times  daily as needed for dizziness. 30 tablet 0   No current facility-administered medications for this visit.    PHYSICAL EXAMINATION: ECOG PERFORMANCE STATUS: {CHL ONC ECOG PS:[id number]}  There were no vitals filed for this visit. There were no vitals filed for this visit.  BREAST:*** No palpable masses or nodules in either right or left breasts. No palpable axillary supraclavicular or infraclavicular adenopathy no breast tenderness or nipple discharge. (exam performed in the  presence of a chaperone)  LABORATORY DATA:  I have reviewed the data as listed    Latest Ref Rng & Units 01/31/2022    7:49 PM 06/12/2018    1:28 PM 06/05/2018    1:42 PM  CMP  Glucose 70 - 99 mg/dL 102  204  99   BUN 6 - 20 mg/dL 7  9  11    Creatinine 0.44 - 1.00 mg/dL 0.75  0.78  0.68   Sodium 135 - 145 mmol/L 138  139  141   Potassium 3.5 - 5.1 mmol/L 3.6  3.6  3.6   Chloride 98 - 111 mmol/L 102  102  105   CO2 22 - 32 mmol/L 27  25  26    Calcium 8.9 - 10.3 mg/dL 9.1  9.3  9.6   Total Protein 6.5 - 8.1 g/dL 7.5  6.9  7.2   Total Bilirubin 0.3 - 1.2 mg/dL 0.4  0.3  0.2   Alkaline Phos 38 - 126 U/L 75  69  70   AST 15 - 41 U/L 19  17  20    ALT 0 - 44 U/L 17  26  32     Lab Results  Component Value Date   WBC 5.8 01/31/2022   HGB 12.4 01/31/2022   HCT 38.8 01/31/2022   MCV 68.1 (L) 01/31/2022   PLT 305 01/31/2022   NEUTROABS 2.8 06/12/2018    ASSESSMENT & PLAN:  No problem-specific Assessment & Plan notes found for this encounter.    No orders of the defined types were placed in this encounter.  The patient has a good understanding of the overall plan. she agrees with it. she will call with any problems that may develop before the next visit here. Total time spent: 30 mins including face to face time and time spent for planning, charting and co-ordination of care   Suzzette Righter, [place] 07/23/22    I Gardiner Coins am scribing for Dr. Lindi Adie  ***

## 2022-07-26 ENCOUNTER — Inpatient Hospital Stay: Payer: 59 | Attending: Hematology and Oncology | Admitting: Hematology and Oncology

## 2022-07-26 ENCOUNTER — Other Ambulatory Visit: Payer: Self-pay

## 2022-07-26 ENCOUNTER — Inpatient Hospital Stay: Payer: 59

## 2022-07-26 DIAGNOSIS — C50211 Malignant neoplasm of upper-inner quadrant of right female breast: Secondary | ICD-10-CM | POA: Diagnosis not present

## 2022-07-26 DIAGNOSIS — Z5111 Encounter for antineoplastic chemotherapy: Secondary | ICD-10-CM | POA: Diagnosis present

## 2022-07-26 DIAGNOSIS — Z95828 Presence of other vascular implants and grafts: Secondary | ICD-10-CM

## 2022-07-26 DIAGNOSIS — Z170 Estrogen receptor positive status [ER+]: Secondary | ICD-10-CM | POA: Diagnosis not present

## 2022-07-26 DIAGNOSIS — Z79899 Other long term (current) drug therapy: Secondary | ICD-10-CM | POA: Insufficient documentation

## 2022-07-26 MED ORDER — GOSERELIN ACETATE 3.6 MG ~~LOC~~ IMPL
3.6000 mg | DRUG_IMPLANT | Freq: Once | SUBCUTANEOUS | Status: AC
  Administered 2022-07-26: 3.6 mg via SUBCUTANEOUS
  Filled 2022-07-26: qty 3.6

## 2022-07-26 NOTE — Assessment & Plan Note (Addendum)
01/10/2018:Palpable right breast mass with calcifications medial right breast at 1 o'clock position: 2.6 cm by ultrasound, 1 abnormal lymph node: Ultrasound biopsy of both the mass and the lymph node revealed grade 3 invasive ductal carcinoma ER 90%, PR 2%, Ki-67 70%, HER-2 negative ratio 0.96, T2 N1 stage II a AJCC 8 clinical stage  Recommendation: 1 Genetic testing: Negative.Staging scans: No metastatic disease 2.neoadjuvant chemotherapy with dose dense Adriamycin and Cytoxan followed by Taxol weekly x12 3.Followed by breast conservingsurgeryon 07/13/2018: Pathologic complete response 4.Followed by adjuvant radiationstarted 08/24/2018  5.Followed by antiestrogen therapy and evaluation on Natalee clinical trial --------------------------------------------------------------------- Recommendation: Antiestrogen therapy with ovarian suppression and letrozolestarted 09/25/2018   Toxicities: Patient is tolerating the treatment extremely well.  Does not have any significant hot flashes or arthralgias or myalgias.  Energy levels go up and down.  Breast cancer surveillance: 1.Mammogram 07/29/2020: Benign breast density category D She has not had a mammogram in 2 years because of insurance issues. 2.Breast MRI was recommended but not done because of cost reasons.  3.Breast exam  07/26/2022: Benign  We will try to connect her with our financial support and social workers to help figure out her insurance situation.  She is in between jobs and is looking for other employment.  Return to clinic monthly for injections and I will see her back in 1 year Return to clinic in 1 year for follow-up

## 2022-07-28 ENCOUNTER — Telehealth: Payer: Self-pay

## 2022-07-28 NOTE — Telephone Encounter (Signed)
Attempted to contact patient regarding applying for ([facility]) medicaid. Left message on voicemail requesting a return call.

## 2022-08-15 ENCOUNTER — Telehealth: Payer: Self-pay | Admitting: Hematology and Oncology

## 2022-08-15 NOTE — Telephone Encounter (Signed)
Scheduled appointment per 9/26 los. Left voicemail.

## 2022-08-18 ENCOUNTER — Encounter: Payer: Self-pay | Admitting: Hematology and Oncology

## 2022-08-24 ENCOUNTER — Encounter: Payer: Self-pay | Admitting: Hematology and Oncology

## 2022-08-25 ENCOUNTER — Inpatient Hospital Stay: Payer: Medicaid Other

## 2022-08-26 ENCOUNTER — Inpatient Hospital Stay: Payer: Medicaid Other

## 2022-08-27 ENCOUNTER — Encounter: Payer: Self-pay | Admitting: Hematology and Oncology

## 2022-08-29 ENCOUNTER — Encounter: Payer: Self-pay | Admitting: Hematology and Oncology

## 2022-08-29 ENCOUNTER — Inpatient Hospital Stay: Payer: Commercial Managed Care - HMO | Attending: Hematology and Oncology

## 2022-08-29 VITALS — BP 115/69 | HR 84 | Temp 98.1°F | Resp 18

## 2022-08-29 DIAGNOSIS — Z5111 Encounter for antineoplastic chemotherapy: Secondary | ICD-10-CM | POA: Diagnosis present

## 2022-08-29 DIAGNOSIS — C50211 Malignant neoplasm of upper-inner quadrant of right female breast: Secondary | ICD-10-CM | POA: Insufficient documentation

## 2022-08-29 DIAGNOSIS — Z79899 Other long term (current) drug therapy: Secondary | ICD-10-CM | POA: Insufficient documentation

## 2022-08-29 DIAGNOSIS — Z95828 Presence of other vascular implants and grafts: Secondary | ICD-10-CM

## 2022-08-29 MED ORDER — GOSERELIN ACETATE 3.6 MG ~~LOC~~ IMPL
3.6000 mg | DRUG_IMPLANT | Freq: Once | SUBCUTANEOUS | Status: AC
  Administered 2022-08-29: 3.6 mg via SUBCUTANEOUS
  Filled 2022-08-29: qty 3.6

## 2022-08-30 ENCOUNTER — Encounter: Payer: Self-pay | Admitting: Hematology and Oncology

## 2022-09-23 ENCOUNTER — Inpatient Hospital Stay: Payer: Commercial Managed Care - HMO

## 2022-09-26 ENCOUNTER — Inpatient Hospital Stay: Payer: Commercial Managed Care - HMO | Attending: Hematology and Oncology

## 2022-09-26 VITALS — BP 112/60 | HR 92 | Temp 98.0°F | Resp 16

## 2022-09-26 DIAGNOSIS — Z79899 Other long term (current) drug therapy: Secondary | ICD-10-CM | POA: Diagnosis not present

## 2022-09-26 DIAGNOSIS — Z170 Estrogen receptor positive status [ER+]: Secondary | ICD-10-CM | POA: Insufficient documentation

## 2022-09-26 DIAGNOSIS — Z79811 Long term (current) use of aromatase inhibitors: Secondary | ICD-10-CM | POA: Diagnosis not present

## 2022-09-26 DIAGNOSIS — Z95828 Presence of other vascular implants and grafts: Secondary | ICD-10-CM

## 2022-09-26 DIAGNOSIS — C50211 Malignant neoplasm of upper-inner quadrant of right female breast: Secondary | ICD-10-CM | POA: Diagnosis not present

## 2022-09-26 MED ORDER — GOSERELIN ACETATE 3.6 MG ~~LOC~~ IMPL
3.6000 mg | DRUG_IMPLANT | Freq: Once | SUBCUTANEOUS | Status: AC
  Administered 2022-09-26: 3.6 mg via SUBCUTANEOUS
  Filled 2022-09-26: qty 3.6

## 2022-10-18 ENCOUNTER — Ambulatory Visit (HOSPITAL_COMMUNITY)
Admission: EM | Admit: 2022-10-18 | Discharge: 2022-10-18 | Disposition: A | Discharge status: Home / Self Care | Payer: Commercial Managed Care - HMO | Attending: Physician Assistant | Admitting: Physician Assistant

## 2022-10-18 ENCOUNTER — Encounter (HOSPITAL_COMMUNITY): Payer: Self-pay | Admitting: Emergency Medicine

## 2022-10-18 DIAGNOSIS — Z1152 Encounter for screening for COVID-19: Secondary | ICD-10-CM | POA: Diagnosis present

## 2022-10-18 DIAGNOSIS — R509 Fever, unspecified: Secondary | ICD-10-CM | POA: Insufficient documentation

## 2022-10-18 DIAGNOSIS — R051 Acute cough: Secondary | ICD-10-CM | POA: Insufficient documentation

## 2022-10-18 DIAGNOSIS — J111 Influenza due to unidentified influenza virus with other respiratory manifestations: Secondary | ICD-10-CM | POA: Diagnosis not present

## 2022-10-18 DIAGNOSIS — J069 Acute upper respiratory infection, unspecified: Secondary | ICD-10-CM | POA: Insufficient documentation

## 2022-10-18 LAB — POC INFLUENZA A AND B ANTIGEN (URGENT CARE ONLY)
INFLUENZA A ANTIGEN, POC: POSITIVE — AB
INFLUENZA B ANTIGEN, POC: NEGATIVE

## 2022-10-18 LAB — SARS CORONAVIRUS 2 (TAT 6-24 HRS): SARS Coronavirus 2: NEGATIVE

## 2022-10-18 MED ORDER — ONDANSETRON HCL 4 MG PO TABS: 4.0000 mg | ORAL_TABLET | Freq: Three times a day (TID) | ORAL | 0 refills | Status: AC | PRN

## 2022-10-18 MED ORDER — OSELTAMIVIR PHOSPHATE 75 MG PO CAPS: 75.0000 mg | ORAL_CAPSULE | Freq: Two times a day (BID) | ORAL | 0 refills | Status: DC

## 2022-10-18 NOTE — Discharge Instructions (Signed)
COVID test will be completed in 48 hours.  If you do not get a call from this office that indicates the test is negative.  Log onto MyChart to view the test results when it post in 48 hours.  Advised to continue to take TheraFlu or similar product for the cough and congestion.  Advised take Tylenol or ibuprofen as needed for aches pains and fever.  Advised to follow-up with PCP or return to urgent care if symptoms fail to improve.

## 2022-10-18 NOTE — ED Provider Notes (Signed)
[facility]    CSN: [id number] Arrival date & time: 10/18/22  1025      History   Chief Complaint Chief Complaint  Patient presents with   URI    HPI Kim Griffin is a 26 y.o. female.   26 year old female presents with fever, nausea, body aches and pain.  Patient indicates she has around her aunt who had similar symptoms that the patient is presenting with today.  Patient indicates that for the past couple days she has been having upper respiratory congestion with rhinitis and postnasal drip which is mainly been clear to greenish in discoloration.  Patient also indicates she has been having cough with chest congestion which has been intermittent with production being clear to greenish.  Patient also indicates that she has been having fever from 100-101.  Patient indicates with the fever she has been having body aches, body pain, soreness, chills and sweats.  Patient indicates she has been taking some TheraFlu for the cough and congestion to help improve her symptoms.  Patient indicates she has had some nausea and some loose stools but has not had any vomiting.  She does relate that she has having some fatigue and lethargy associated with her symptoms.   URI Presenting symptoms: fatigue and fever     Past Medical History:  Diagnosis Date   Anemia    takes iron supplement   Breast cancer, right ([place]) 12/2017   History of chemotherapy    finished chemo 06/12/2018   Personal history of chemotherapy    Personal history of radiation therapy    Sickle cell trait [facility])     Patient Active Problem List   Diagnosis Date Noted   Port-A-Cath in place 03/28/2018   Genetic testing 02/08/2018   Malignant neoplasm of upper-inner quadrant of right breast in female, estrogen receptor positive ([place]) 01/16/2018    Past Surgical History:  Procedure Laterality Date   BREAST LUMPECTOMY Right    BREAST LUMPECTOMY WITH RADIOACTIVE SEED AND SENTINEL LYMPH NODE BIOPSY Right  07/13/2018   Procedure: RIGHT BREAST LUMPECTOMY X 2 WITH RADIOACTIVE SEED X 2, RIGHT SEED TARGETED AXILLARY LYMPH NODE EXCISION AND RIGHT SENTINEL LYMPH NODE BIOPSY;  Surgeon: Alphonsa Overall, MD;  Location: [place];  Service: General;  Laterality: Right;   PORT-A-CATH REMOVAL Right 07/13/2018   Procedure: REMOVAL PORT-A-CATH;  Surgeon: Alphonsa Overall, MD;  Location: [place];  Service: General;  Laterality: Right;   PORTACATH PLACEMENT N/A 01/22/2018   Procedure: [place];  Surgeon: Alphonsa Overall, MD;  Location: [place];  Service: General;  Laterality: N/A;   WISDOM TOOTH EXTRACTION      OB History   No obstetric history on file.      Home Medications    Prior to Admission medications   Medication Sig Start Date End Date Taking? Authorizing Provider  ondansetron (ZOFRAN) 4 MG tablet Take 1 tablet (4 mg total) by mouth every 8 (eight) hours as needed for nausea or vomiting. 10/18/22  Yes Nyoka Lint, PA-C  oseltamivir (TAMIFLU) 75 MG capsule Take 1 capsule (75 mg total) by mouth every 12 (twelve) hours. 10/18/22  Yes Nyoka Lint, PA-C  ferrous sulfate 325 (65 FE) MG tablet Take 325 mg by mouth daily with breakfast.    [provider]  goserelin (ZOLADEX) 10.8 MG injection  01/24/19   [provider]  letrozole (FEMARA) 2.5 MG tablet Take 1 tablet (2.5 mg total) by mouth daily. 07/08/21  Nicholas Lose, MD  meclizine (ANTIVERT) 25 MG tablet Take 1 tablet (25 mg total) by mouth 3 (three) times daily as needed for dizziness. 01/31/22   Barrett Henle, MD    Family History Family History  Problem Relation Age of Onset   Lung cancer Other        2 of maternal grandmother's siblings   Breast cancer Cousin        2nd cousin    Social History Social History   Tobacco Use   Smoking status: Never   Smokeless tobacco: Never  Vaping Use   Vaping Use: Never used  Substance Use Topics   Alcohol use:  No   Drug use: No     Allergies   Patient has no known allergies.   Review of Systems Review of Systems  Constitutional:  Positive for chills, fatigue and fever.  HENT:  Positive for postnasal drip and sinus pressure.   Gastrointestinal:  Positive for diarrhea and nausea.     Physical Exam Triage Vital Signs ED Triage Vitals  Enc Vitals Group     BP 10/18/22 1319 109/64     Pulse Rate 10/18/22 1319 90     Resp 10/18/22 1319 18     Temp 10/18/22 1319 98.5 F (36.9 C)     Temp Source 10/18/22 1319 Oral     SpO2 10/18/22 1319 96 %     Weight --      Height --      Head Circumference --      Peak Flow --      Pain Score 10/18/22 1317 2     Pain Loc --      Pain Edu? --      Excl. in [place]? --    No data found.  Updated Vital Signs BP 109/64 (BP Location: Left Arm)   Pulse 90   Temp 98.5 F (36.9 C) (Oral)   Resp 18   SpO2 96%   Visual Acuity Right Eye Distance:   Left Eye Distance:   Bilateral Distance:    Right Eye Near:   Left Eye Near:    Bilateral Near:     Physical Exam Constitutional:      Appearance: Normal appearance.  HENT:     Right Ear: Tympanic membrane and ear canal normal.     Left Ear: Tympanic membrane and ear canal normal.     Mouth/Throat:     Mouth: Mucous membranes are moist.     Pharynx: Oropharynx is clear.  Cardiovascular:     Rate and Rhythm: Normal rate and regular rhythm.     Heart sounds: Normal heart sounds.  Pulmonary:     Effort: Pulmonary effort is normal.     Breath sounds: Normal air entry. Examination of the right-lower field reveals rhonchi. Examination of the left-lower field reveals rhonchi. Rhonchi (mild) present. No wheezing or rales.  Lymphadenopathy:     Cervical: No cervical adenopathy.  Neurological:     Mental Status: She is alert.      UC Treatments / Results  Labs (all labs ordered are listed, but only abnormal results are displayed) Labs Reviewed  POC INFLUENZA A AND B ANTIGEN (URGENT CARE ONLY)  - Abnormal; Notable for the following components:      Result Value   INFLUENZA A ANTIGEN, POC POSITIVE (*)    All other components within normal limits  SARS CORONAVIRUS 2 (TAT 6-24 HRS)    EKG   Radiology No results  found.  Procedures Procedures (including critical care time)  Medications Ordered in UC Medications - No data to display  Initial Impression / Assessment and Plan / UC Course  I have reviewed the triage vital signs and the nursing notes.  Pertinent labs & imaging results that were available during my care of the patient were reviewed by me and considered in my medical decision making (see chart for details).    Plan: 1.  The viral upper respiratory tract infection will be treated with the following: A.  Advised patient to continue taking TheraFlu or similar product for the congestion and cough. 2.  The cough will be treated with the following: A.  Advised the patient to continue taking TheraFlu or similar product for the congestion and cough. 3.  The fever will be treated with the following: A.  Advised patient to take Tylenol and ibuprofen to help reduce fever and body aches. 4.  Screening for COVID-19 will be treated with the following: A.  Treatment changes may be considered depending on the results of COVID-19 test. The fluid will be treated with the following: A.  Tamiflu 75 mg twice daily for 5 days to treat the flu. 5.  Advised follow-up PCP or return to urgent care if symptoms fail to improve. Final Clinical Impressions(s) / UC Diagnoses   Final diagnoses:  Viral upper respiratory tract infection  Acute cough  Fever, unspecified  Encounter for screening for COVID-19  Flu     Discharge Instructions      COVID test will be completed in 48 hours.  If you do not get a call from this office that indicates the test is negative.  Log onto MyChart to view the test results when it post in 48 hours.  Advised to continue to take TheraFlu or similar  product for the cough and congestion.  Advised take Tylenol or ibuprofen as needed for aches pains and fever.  Advised to follow-up with PCP or return to urgent care if symptoms fail to improve.    ED Prescriptions     Medication Sig Dispense Auth. Provider   ondansetron (ZOFRAN) 4 MG tablet Take 1 tablet (4 mg total) by mouth every 8 (eight) hours as needed for nausea or vomiting. 20 tablet Nyoka Lint, PA-C   oseltamivir (TAMIFLU) 75 MG capsule Take 1 capsule (75 mg total) by mouth every 12 (twelve) hours. 10 capsule Nyoka Lint, PA-C      PDMP not reviewed this encounter.   Nyoka Lint, PA-C 10/18/22 1413

## 2022-10-18 NOTE — ED Triage Notes (Signed)
Pt reports a fever, nasal congestion, headaches, cold chills, diarrhea, and nausea. Symptoms started yesterday. Has been taking Tylenol, Ibuprofen and Nyquil.

## 2022-10-21 ENCOUNTER — Inpatient Hospital Stay: Payer: Commercial Managed Care - HMO | Attending: Hematology and Oncology

## 2022-10-21 ENCOUNTER — Telehealth: Payer: Self-pay | Admitting: Hematology and Oncology

## 2022-10-21 NOTE — Telephone Encounter (Signed)
Called patient per 12/22 high priority to r/s missed injection. Patient is sick, unsure if covid. Kept existing appointments and added final injection at the end. Patient notified.

## 2022-11-11 ENCOUNTER — Encounter: Payer: Self-pay | Admitting: Hematology and Oncology

## 2022-11-18 ENCOUNTER — Inpatient Hospital Stay: Payer: 59 | Attending: Hematology and Oncology

## 2022-11-18 ENCOUNTER — Other Ambulatory Visit: Payer: Self-pay | Admitting: *Deleted

## 2022-11-18 VITALS — BP 116/66 | HR 90 | Temp 98.3°F | Resp 16

## 2022-11-18 DIAGNOSIS — Z170 Estrogen receptor positive status [ER+]: Secondary | ICD-10-CM | POA: Insufficient documentation

## 2022-11-18 DIAGNOSIS — Z79811 Long term (current) use of aromatase inhibitors: Secondary | ICD-10-CM | POA: Insufficient documentation

## 2022-11-18 DIAGNOSIS — C50211 Malignant neoplasm of upper-inner quadrant of right female breast: Secondary | ICD-10-CM | POA: Diagnosis present

## 2022-11-18 DIAGNOSIS — Z79899 Other long term (current) drug therapy: Secondary | ICD-10-CM | POA: Diagnosis not present

## 2022-11-18 DIAGNOSIS — Z95828 Presence of other vascular implants and grafts: Secondary | ICD-10-CM

## 2022-11-18 MED ORDER — GOSERELIN ACETATE 3.6 MG ~~LOC~~ IMPL
3.6000 mg | DRUG_IMPLANT | Freq: Once | SUBCUTANEOUS | Status: AC
  Administered 2022-11-18: 3.6 mg via SUBCUTANEOUS
  Filled 2022-11-18: qty 3.6

## 2022-11-18 MED ORDER — LETROZOLE 2.5 MG PO TABS: 2.5000 mg | ORAL_TABLET | Freq: Every day | ORAL | 3 refills | Status: DC

## 2022-11-18 NOTE — Patient Instructions (Signed)
Goserelin Implant What is this medication? GOSERELIN (GOE se rel in) treats prostate cancer and breast cancer. It works by decreasing levels of the hormones testosterone and estrogen in the body. This prevents prostate and breast cancer cells from spreading or growing. It may also be used to treat endometriosis. This is a condition where the tissue that lines the uterus grows outside the uterus. It works by decreasing the amount of estrogen your body makes, which reduces heavy bleeding and pain. It can also be used to help thin the lining of the uterus before a surgery used to prevent or reduce heavy periods. This medicine may be used for other purposes; ask your health care provider or pharmacist if you have questions. COMMON BRAND NAME(S): Zoladex, Zoladex 3-Month What should I tell my care team before I take this medication? They need to know if you have any of these conditions: Bone problems Diabetes Heart disease History of irregular heartbeat or rhythm An unusual or allergic reaction to goserelin, other medications, foods, dyes, or preservatives Pregnant or trying to get pregnant Breastfeeding How should I use this medication? This medication is injected under the skin. It is given by your care team in a hospital or clinic setting. Talk to your care team about the use of this medication in children. Special care may be needed. Overdosage: If you think you have taken too much of this medicine contact a poison control center or emergency room at once. NOTE: This medicine is only for you. Do not share this medicine with others. What if I miss a dose? Keep appointments for follow-up doses. It is important not to miss your dose. Call your care team if you are unable to keep an appointment. What may interact with this medication? Do not take this medication with any of the following: Cisapride Dronedarone Pimozide Thioridazine This medication may also interact with the following: Other  medications that cause heart rhythm changes This list may not describe all possible interactions. Give your health care provider a list of all the medicines, herbs, non-prescription drugs, or dietary supplements you use. Also tell them if you smoke, drink alcohol, or use illegal drugs. Some items may interact with your medicine. What should I watch for while using this medication? Visit your care team for regular checks on your progress. Your symptoms may appear to get worse during the first weeks of this therapy. Tell your care team if your symptoms do not start to get better or if they get worse after this time. Using this medication for a long time may weaken your bones. If you smoke or frequently drink alcohol you may increase your risk of bone loss. A family history of osteoporosis, chronic use of medications for seizures (convulsions), or corticosteroids can also increase your risk of bone loss. The risk of bone fractures may be increased. Talk to your care team about your bone health. This medication may increase blood sugar. The risk may be higher in patients who already have diabetes. Ask your care team what you can do to lower your risk of diabetes while taking this medication. This medication should stop regular monthly menstruation in women. Tell your care team if you continue to menstruate. Talk to your care team if you wish to become pregnant or think you might be pregnant. This medication can cause serious birth defects if taken during pregnancy or for 12 weeks after stopping treatment. Talk to your care team about reliable forms of contraception. Do not breastfeed while taking this   medication. This medication may cause infertility. Talk to your care team if you are concerned about your fertility. What side effects may I notice from receiving this medication? Side effects that you should report to your care team as soon as possible: Allergic reactions--skin rash, itching, hives, swelling  of the face, lips, tongue, or throat Change in the amount of urine Heart attack--pain or tightness in the chest, shoulders, arms, or jaw, nausea, shortness of breath, cold or clammy skin, feeling faint or lightheaded Heart rhythm changes--fast or irregular heartbeat, dizziness, feeling faint or lightheaded, chest pain, trouble breathing High blood sugar (hyperglycemia)--increased thirst or amount of urine, unusual weakness or fatigue, blurry vision High calcium level--increased thirst or amount of urine, nausea, vomiting, confusion, unusual weakness or fatigue, bone pain Pain, redness, irritation, or bruising at the injection site Severe back pain, numbness or weakness of the hands, arms, legs, or feet, loss of coordination, loss of bowel or bladder control Stroke--sudden numbness or weakness of the face, arm, or leg, trouble speaking, confusion, trouble walking, loss of balance or coordination, dizziness, severe headache, change in vision Swelling and pain of the tumor site or lymph nodes Trouble passing urine Side effects that usually do not require medical attention (report to your care team if they continue or are bothersome): Change in sex drive or performance Headache Hot flashes Rapid or extreme change in emotion or mood Sweating Swelling of the ankles, hands, or feet Unusual vaginal discharge, itching, or odor This list may not describe all possible side effects. Call your doctor for medical advice about side effects. You may report side effects to FDA at 1-800-FDA-1088. Where should I keep my medication? This medication is given in a hospital or clinic. It will not be stored at home. NOTE: This sheet is a summary. It may not cover all possible information. If you have questions about this medicine, talk to your doctor, pharmacist, or health care provider.  2023 Elsevier/Gold Standard (2007-12-08 00:00:00)  

## 2022-12-05 ENCOUNTER — Encounter: Payer: Self-pay | Admitting: Hematology and Oncology

## 2022-12-16 ENCOUNTER — Inpatient Hospital Stay: Payer: 59 | Attending: Hematology and Oncology

## 2022-12-16 VITALS — BP 114/68 | HR 76 | Temp 98.4°F | Resp 16

## 2022-12-16 DIAGNOSIS — Z95828 Presence of other vascular implants and grafts: Secondary | ICD-10-CM

## 2022-12-16 DIAGNOSIS — Z79811 Long term (current) use of aromatase inhibitors: Secondary | ICD-10-CM | POA: Diagnosis not present

## 2022-12-16 DIAGNOSIS — C50211 Malignant neoplasm of upper-inner quadrant of right female breast: Secondary | ICD-10-CM | POA: Diagnosis present

## 2022-12-16 DIAGNOSIS — Z170 Estrogen receptor positive status [ER+]: Secondary | ICD-10-CM | POA: Diagnosis not present

## 2022-12-16 MED ORDER — GOSERELIN ACETATE 3.6 MG ~~LOC~~ IMPL
3.6000 mg | DRUG_IMPLANT | Freq: Once | SUBCUTANEOUS | Status: AC
  Administered 2022-12-16: 3.6 mg via SUBCUTANEOUS
  Filled 2022-12-16: qty 3.6

## 2023-01-13 ENCOUNTER — Encounter: Payer: Self-pay | Admitting: *Deleted

## 2023-01-13 ENCOUNTER — Inpatient Hospital Stay: Payer: Medicaid Other | Attending: Hematology and Oncology

## 2023-01-13 DIAGNOSIS — C50211 Malignant neoplasm of upper-inner quadrant of right female breast: Secondary | ICD-10-CM | POA: Insufficient documentation

## 2023-01-13 DIAGNOSIS — Z170 Estrogen receptor positive status [ER+]: Secondary | ICD-10-CM | POA: Insufficient documentation

## 2023-01-13 DIAGNOSIS — Z79811 Long term (current) use of aromatase inhibitors: Secondary | ICD-10-CM | POA: Insufficient documentation

## 2023-01-13 NOTE — Progress Notes (Signed)
Pt missed injection appt today.  Message sent to scheduler to reschedule appt and adjust future appts.  Pt prefers timeframe from 2 pm - 345 pm.

## 2023-01-16 ENCOUNTER — Encounter (HOSPITAL_COMMUNITY): Payer: Self-pay | Admitting: *Deleted

## 2023-01-16 ENCOUNTER — Other Ambulatory Visit: Payer: Self-pay

## 2023-01-16 ENCOUNTER — Ambulatory Visit (INDEPENDENT_AMBULATORY_CARE_PROVIDER_SITE_OTHER): Payer: Medicaid Other

## 2023-01-16 ENCOUNTER — Ambulatory Visit (HOSPITAL_COMMUNITY)
Admission: EM | Admit: 2023-01-16 | Discharge: 2023-01-16 | Disposition: A | Discharge status: Home / Self Care | Payer: Medicaid Other | Attending: Family Medicine | Admitting: Family Medicine

## 2023-01-16 DIAGNOSIS — W19XXXA Unspecified fall, initial encounter: Secondary | ICD-10-CM | POA: Diagnosis not present

## 2023-01-16 DIAGNOSIS — Z3202 Encounter for pregnancy test, result negative: Secondary | ICD-10-CM | POA: Diagnosis not present

## 2023-01-16 DIAGNOSIS — S62347A Nondisplaced fracture of base of fifth metacarpal bone. left hand, initial encounter for closed fracture: Secondary | ICD-10-CM

## 2023-01-16 DIAGNOSIS — M79642 Pain in left hand: Secondary | ICD-10-CM

## 2023-01-16 LAB — POC URINE PREG, ED: Preg Test, Ur: NEGATIVE

## 2023-01-16 MED ORDER — ACETAMINOPHEN 325 MG PO TABS
ORAL_TABLET | ORAL | Status: AC
  Filled 2023-01-16: qty 2

## 2023-01-16 MED ORDER — IBUPROFEN 600 MG PO TABS: 600.0000 mg | ORAL_TABLET | Freq: Four times a day (QID) | ORAL | 0 refills | Status: AC | PRN

## 2023-01-16 MED ORDER — ACETAMINOPHEN 325 MG PO TABS
650.0000 mg | ORAL_TABLET | Freq: Once | ORAL | Status: AC
  Administered 2023-01-16: 650 mg via ORAL

## 2023-01-16 NOTE — ED Notes (Signed)
Ortho tech called 

## 2023-01-16 NOTE — ED Provider Notes (Signed)
[facility]    CSN: [id number] Arrival date & time: 01/16/23  1205      History   Chief Complaint Chief Complaint  Patient presents with   Wrist Pain    HPI Kim Griffin is a 27 y.o. female.  Yesterday fell onto her left hand Pain is 4/10 medially Has been using ace wrap, ice. No meds taken No history of injury to this hand  Past Medical History:  Diagnosis Date   Anemia    takes iron supplement   Breast cancer, right ([place]) 12/2017   History of chemotherapy    finished chemo 06/12/2018   Personal history of chemotherapy    Personal history of radiation therapy    Sickle cell trait [facility])     Patient Active Problem List   Diagnosis Date Noted   Port-A-Cath in place 03/28/2018   Genetic testing 02/08/2018   Malignant neoplasm of upper-inner quadrant of right breast in female, estrogen receptor positive ([place]) 01/16/2018    Past Surgical History:  Procedure Laterality Date   BREAST LUMPECTOMY Right    BREAST LUMPECTOMY WITH RADIOACTIVE SEED AND SENTINEL LYMPH NODE BIOPSY Right 07/13/2018   Procedure: RIGHT BREAST LUMPECTOMY X 2 WITH RADIOACTIVE SEED X 2, RIGHT SEED TARGETED AXILLARY LYMPH NODE EXCISION AND RIGHT SENTINEL LYMPH NODE BIOPSY;  Surgeon: Alphonsa Overall, MD;  Location: [place];  Service: General;  Laterality: Right;   PORT-A-CATH REMOVAL Right 07/13/2018   Procedure: REMOVAL PORT-A-CATH;  Surgeon: Alphonsa Overall, MD;  Location: [place];  Service: General;  Laterality: Right;   PORTACATH PLACEMENT N/A 01/22/2018   Procedure: [place];  Surgeon: Alphonsa Overall, MD;  Location: [place];  Service: General;  Laterality: N/A;   WISDOM TOOTH EXTRACTION      OB History   No obstetric history on file.      Home Medications    Prior to Admission medications   Medication Sig Start Date End Date Taking? Authorizing Provider  ibuprofen (ADVIL) 600 MG tablet Take 1 tablet  (600 mg total) by mouth every 6 (six) hours as needed. 01/16/23  Yes Autie Vasudevan, Wells Guiles, PA-C  ferrous sulfate 325 (65 FE) MG tablet Take 325 mg by mouth daily with breakfast.    [provider]  goserelin (ZOLADEX) 10.8 MG injection  01/24/19   [provider]  letrozole (FEMARA) 2.5 MG tablet Take 1 tablet (2.5 mg total) by mouth daily. 11/18/22   Nicholas Lose, MD  meclizine (ANTIVERT) 25 MG tablet Take 1 tablet (25 mg total) by mouth 3 (three) times daily as needed for dizziness. 01/31/22   Barrett Henle, MD  ondansetron (ZOFRAN) 4 MG tablet Take 1 tablet (4 mg total) by mouth every 8 (eight) hours as needed for nausea or vomiting. 10/18/22   Nyoka Lint, PA-C  oseltamivir (TAMIFLU) 75 MG capsule Take 1 capsule (75 mg total) by mouth every 12 (twelve) hours. 10/18/22   Nyoka Lint, PA-C    Family History Family History  Problem Relation Age of Onset   Lung cancer Other        2 of maternal grandmother's siblings   Breast cancer Cousin        2nd cousin    Social History Social History   Tobacco Use   Smoking status: Never   Smokeless tobacco: Never  Vaping Use   Vaping Use: Never used  Substance Use Topics   Alcohol use: No   Drug use: No  Allergies   Patient has no known allergies.   Review of Systems Review of Systems As per HPI  Physical Exam Triage Vital Signs ED Triage Vitals  Enc Vitals Group     BP 01/16/23 1332 118/74     Pulse Rate 01/16/23 1332 (!) 112     Resp 01/16/23 1332 18     Temp 01/16/23 1332 97.9 F (36.6 C)     Temp src --      SpO2 01/16/23 1332 95 %     Weight --      Height --      Head Circumference --      Peak Flow --      Pain Score 01/16/23 1330 4     Pain Loc --      Pain Edu? --      Excl. in [place]? --    No data found.  Updated Vital Signs BP 118/74   Pulse (!) 112   Temp 97.9 F (36.6 C)   Resp 18   SpO2 95%   Physical Exam Vitals and nursing note reviewed.  Constitutional:      General: She  is not in acute distress. Cardiovascular:     Rate and Rhythm: Normal rate and regular rhythm.     Pulses: Normal pulses.  Pulmonary:     Effort: Pulmonary effort is normal.  Musculoskeletal:     Left wrist: Bony tenderness present. No swelling, deformity or snuff box tenderness. Decreased range of motion. Normal pulse.     Comments: Tender over distal ulnar, medial wrist/hand. Decreased ROM due to pain. Grip strength 3/5. Strong radial pulse, sensation intact distally  Skin:    General: Skin is warm and dry.     Capillary Refill: Capillary refill takes less than 2 seconds.     Findings: No bruising or erythema.  Neurological:     Mental Status: She is alert and oriented to person, place, and time.     UC Treatments / Results  Labs (all labs ordered are listed, but only abnormal results are displayed) Labs Reviewed  POC URINE PREG, ED    EKG  Radiology DG Hand Complete Left  Result Date: 01/16/2023 CLINICAL DATA:  Fall yesterday.  Lateral hand pain. EXAM: LEFT HAND - COMPLETE 3+ VIEW COMPARISON:  None Available. FINDINGS: Vague lucency projects across the base of the 5th metacarpal on the PA view, not well seen on the other views. This could reflect a nondisplaced fracture. No other evidence of acute fracture or dislocation. The joint spaces are preserved. No foreign bodies are identified. IMPRESSION: Possible nondisplaced fracture of the base of the 5th metacarpal. Correlate for point tenderness. Electronically Signed   By: Richardean Sale M.D.   On: 01/16/2023 14:17    Procedures Procedures   Medications Ordered in UC Medications  acetaminophen (TYLENOL) tablet 650 mg (650 mg Oral Given 01/16/23 1504)    Initial Impression / Assessment and Plan / UC Course  I have reviewed the triage vital signs and the nursing notes.  Pertinent labs & imaging results that were available during my care of the patient were reviewed by me and considered in my medical decision making (see  chart for details).  No LMP. UPT neg. Left hand x-ray showing lucency across base of 5th metacarpal suspicious for nondisplaced fracture. She is tender in this area. Neurologically intact Short arm splint applied, tylenol dose given Ice, elevate, ibuprofen or tylenol Recommend follow up with ortho next week for repeat  imaging and further eval if needed  Final Clinical Impressions(s) / UC Diagnoses   Final diagnoses:  Closed nondisplaced fracture of base of fifth metacarpal bone of left hand, initial encounter     Discharge Instructions      I recommend follow up with the orthopedic clinic sometime over the next week for repeat imaging.  For pain control: Tylenol - 650 mg every 6 hours   and/or Ibuprofen - 600 mg every 6 hours  Elevate the arm as often as possible     ED Prescriptions     Medication Sig Dispense Auth. Provider   ibuprofen (ADVIL) 600 MG tablet Take 1 tablet (600 mg total) by mouth every 6 (six) hours as needed. 30 tablet Ramesh Moan, Wells Guiles, PA-C      PDMP not reviewed this encounter.   [place], [place] 01/16/23 1534

## 2023-01-16 NOTE — Progress Notes (Signed)
Orthopedic Tech Progress Note Patient Details:  Yulitza Afzali [facility] 19-Aug-1996 [id number]  Ortho Devices Type of Ortho Device: Ulna gutter splint Ortho Device/Splint Location: Left hand Ortho Device/Splint Interventions: Application   Post Interventions Patient Tolerated: Well Instructions Provided: Care of device  Yon Schiffman E Kasiah Manka 01/16/2023, 5:28 PM

## 2023-01-16 NOTE — Discharge Instructions (Addendum)
I recommend follow up with the orthopedic clinic sometime over the next week for repeat imaging.  For pain control: Tylenol - 650 mg every 6 hours   and/or Ibuprofen - 600 mg every 6 hours  Elevate the arm as often as possible

## 2023-01-16 NOTE — ED Triage Notes (Signed)
Pt fell yesterday  onto Lt hand while helping her Mother move. Pt reports pain is located on Lateral side of Lt hand. Pt presents today with an Ace wrap from home.

## 2023-01-17 ENCOUNTER — Telehealth: Payer: Self-pay | Admitting: Hematology and Oncology

## 2023-01-17 NOTE — Telephone Encounter (Signed)
Per 3/18 IB reached out to patient to reschedule; patient aware of date and time of appointment.

## 2023-01-20 ENCOUNTER — Ambulatory Visit: Payer: Medicaid Other

## 2023-01-20 ENCOUNTER — Inpatient Hospital Stay: Payer: Medicaid Other

## 2023-01-20 VITALS — BP 106/76 | HR 71 | Temp 98.2°F | Resp 16

## 2023-01-20 DIAGNOSIS — Z95828 Presence of other vascular implants and grafts: Secondary | ICD-10-CM

## 2023-01-20 DIAGNOSIS — Z79811 Long term (current) use of aromatase inhibitors: Secondary | ICD-10-CM | POA: Diagnosis not present

## 2023-01-20 DIAGNOSIS — Z170 Estrogen receptor positive status [ER+]: Secondary | ICD-10-CM | POA: Diagnosis not present

## 2023-01-20 DIAGNOSIS — C50211 Malignant neoplasm of upper-inner quadrant of right female breast: Secondary | ICD-10-CM | POA: Diagnosis not present

## 2023-01-20 MED ORDER — GOSERELIN ACETATE 3.6 MG ~~LOC~~ IMPL
3.6000 mg | DRUG_IMPLANT | Freq: Once | SUBCUTANEOUS | Status: AC
  Administered 2023-01-20: 3.6 mg via SUBCUTANEOUS
  Filled 2023-01-20: qty 3.6

## 2023-01-20 NOTE — Patient Instructions (Signed)
Goserelin Implant What is this medication? GOSERELIN (GOE se rel in) treats prostate cancer and breast cancer. It works by decreasing levels of the hormones testosterone and estrogen in the body. This prevents prostate and breast cancer cells from spreading or growing. It may also be used to treat endometriosis. This is a condition where the tissue that lines the uterus grows outside the uterus. It works by decreasing the amount of estrogen your body makes, which reduces heavy bleeding and pain. It can also be used to help thin the lining of the uterus before a surgery used to prevent or reduce heavy periods. This medicine may be used for other purposes; ask your health care provider or pharmacist if you have questions. COMMON BRAND NAME(S): Zoladex, Zoladex 3-Month What should I tell my care team before I take this medication? They need to know if you have any of these conditions: Bone problems Diabetes Heart disease History of irregular heartbeat or rhythm An unusual or allergic reaction to goserelin, other medications, foods, dyes, or preservatives Pregnant or trying to get pregnant Breastfeeding How should I use this medication? This medication is injected under the skin. It is given by your care team in a hospital or clinic setting. Talk to your care team about the use of this medication in children. Special care may be needed. Overdosage: If you think you have taken too much of this medicine contact a poison control center or emergency room at once. NOTE: This medicine is only for you. Do not share this medicine with others. What if I miss a dose? Keep appointments for follow-up doses. It is important not to miss your dose. Call your care team if you are unable to keep an appointment. What may interact with this medication? Do not take this medication with any of the following: Cisapride Dronedarone Pimozide Thioridazine This medication may also interact with the following: Other  medications that cause heart rhythm changes This list may not describe all possible interactions. Give your health care provider a list of all the medicines, herbs, non-prescription drugs, or dietary supplements you use. Also tell them if you smoke, drink alcohol, or use illegal drugs. Some items may interact with your medicine. What should I watch for while using this medication? Visit your care team for regular checks on your progress. Your symptoms may appear to get worse during the first weeks of this therapy. Tell your care team if your symptoms do not start to get better or if they get worse after this time. Using this medication for a long time may weaken your bones. If you smoke or frequently drink alcohol you may increase your risk of bone loss. A family history of osteoporosis, chronic use of medications for seizures (convulsions), or corticosteroids can also increase your risk of bone loss. The risk of bone fractures may be increased. Talk to your care team about your bone health. This medication may increase blood sugar. The risk may be higher in patients who already have diabetes. Ask your care team what you can do to lower your risk of diabetes while taking this medication. This medication should stop regular monthly menstruation in women. Tell your care team if you continue to menstruate. Talk to your care team if you wish to become pregnant or think you might be pregnant. This medication can cause serious birth defects if taken during pregnancy or for 12 weeks after stopping treatment. Talk to your care team about reliable forms of contraception. Do not breastfeed while taking this   medication. This medication may cause infertility. Talk to your care team if you are concerned about your fertility. What side effects may I notice from receiving this medication? Side effects that you should report to your care team as soon as possible: Allergic reactions--skin rash, itching, hives, swelling  of the face, lips, tongue, or throat Change in the amount of urine Heart attack--pain or tightness in the chest, shoulders, arms, or jaw, nausea, shortness of breath, cold or clammy skin, feeling faint or lightheaded Heart rhythm changes--fast or irregular heartbeat, dizziness, feeling faint or lightheaded, chest pain, trouble breathing High blood sugar (hyperglycemia)--increased thirst or amount of urine, unusual weakness or fatigue, blurry vision High calcium level--increased thirst or amount of urine, nausea, vomiting, confusion, unusual weakness or fatigue, bone pain Pain, redness, irritation, or bruising at the injection site Severe back pain, numbness or weakness of the hands, arms, legs, or feet, loss of coordination, loss of bowel or bladder control Stroke--sudden numbness or weakness of the face, arm, or leg, trouble speaking, confusion, trouble walking, loss of balance or coordination, dizziness, severe headache, change in vision Swelling and pain of the tumor site or lymph nodes Trouble passing urine Side effects that usually do not require medical attention (report to your care team if they continue or are bothersome): Change in sex drive or performance Headache Hot flashes Rapid or extreme change in emotion or mood Sweating Swelling of the ankles, hands, or feet Unusual vaginal discharge, itching, or odor This list may not describe all possible side effects. Call your doctor for medical advice about side effects. You may report side effects to FDA at 1-800-FDA-1088. Where should I keep my medication? This medication is given in a hospital or clinic. It will not be stored at home. NOTE: This sheet is a summary. It may not cover all possible information. If you have questions about this medicine, talk to your doctor, pharmacist, or health care provider.  2023 Elsevier/Gold Standard (2022-03-02 00:00:00)  

## 2023-01-26 DIAGNOSIS — M79642 Pain in left hand: Secondary | ICD-10-CM | POA: Diagnosis not present

## 2023-01-26 DIAGNOSIS — S62307A Unspecified fracture of fifth metacarpal bone, left hand, initial encounter for closed fracture: Secondary | ICD-10-CM | POA: Diagnosis not present

## 2023-02-10 ENCOUNTER — Inpatient Hospital Stay: Payer: 59

## 2023-02-15 DIAGNOSIS — M79642 Pain in left hand: Secondary | ICD-10-CM | POA: Diagnosis not present

## 2023-02-16 ENCOUNTER — Telehealth: Payer: Self-pay | Admitting: Hematology and Oncology

## 2023-02-16 NOTE — Telephone Encounter (Signed)
Scheduled appointments per staff message. Patient is aware of all made appointments. Also confirmed the patients appointment for 9:30 on 19 April.

## 2023-02-17 ENCOUNTER — Inpatient Hospital Stay: Payer: 59 | Attending: Hematology and Oncology

## 2023-02-17 VITALS — BP 110/70 | HR 92 | Temp 98.4°F | Resp 16

## 2023-02-17 DIAGNOSIS — Z170 Estrogen receptor positive status [ER+]: Secondary | ICD-10-CM | POA: Insufficient documentation

## 2023-02-17 DIAGNOSIS — Z79899 Other long term (current) drug therapy: Secondary | ICD-10-CM | POA: Diagnosis not present

## 2023-02-17 DIAGNOSIS — C50211 Malignant neoplasm of upper-inner quadrant of right female breast: Secondary | ICD-10-CM | POA: Insufficient documentation

## 2023-02-17 DIAGNOSIS — Z95828 Presence of other vascular implants and grafts: Secondary | ICD-10-CM

## 2023-02-17 DIAGNOSIS — Z5111 Encounter for antineoplastic chemotherapy: Secondary | ICD-10-CM | POA: Insufficient documentation

## 2023-02-17 MED ORDER — GOSERELIN ACETATE 3.6 MG ~~LOC~~ IMPL
3.6000 mg | DRUG_IMPLANT | Freq: Once | SUBCUTANEOUS | Status: AC
  Administered 2023-02-17: 3.6 mg via SUBCUTANEOUS
  Filled 2023-02-17: qty 3.6

## 2023-03-03 ENCOUNTER — Ambulatory Visit: Payer: Commercial Managed Care - HMO

## 2023-03-10 ENCOUNTER — Ambulatory Visit: Payer: Self-pay

## 2023-03-15 DIAGNOSIS — M79642 Pain in left hand: Secondary | ICD-10-CM | POA: Diagnosis not present

## 2023-03-17 ENCOUNTER — Inpatient Hospital Stay: Payer: Medicaid Other | Attending: Hematology and Oncology

## 2023-03-17 VITALS — BP 113/66 | HR 84 | Temp 98.4°F | Resp 16

## 2023-03-17 DIAGNOSIS — Z95828 Presence of other vascular implants and grafts: Secondary | ICD-10-CM

## 2023-03-17 DIAGNOSIS — Z79899 Other long term (current) drug therapy: Secondary | ICD-10-CM | POA: Diagnosis not present

## 2023-03-17 DIAGNOSIS — Z5111 Encounter for antineoplastic chemotherapy: Secondary | ICD-10-CM | POA: Insufficient documentation

## 2023-03-17 DIAGNOSIS — C50211 Malignant neoplasm of upper-inner quadrant of right female breast: Secondary | ICD-10-CM | POA: Insufficient documentation

## 2023-03-17 DIAGNOSIS — Z170 Estrogen receptor positive status [ER+]: Secondary | ICD-10-CM | POA: Diagnosis not present

## 2023-03-17 MED ORDER — GOSERELIN ACETATE 3.6 MG ~~LOC~~ IMPL
3.6000 mg | DRUG_IMPLANT | Freq: Once | SUBCUTANEOUS | Status: AC
  Administered 2023-03-17: 3.6 mg via SUBCUTANEOUS
  Filled 2023-03-17: qty 3.6

## 2023-04-14 ENCOUNTER — Other Ambulatory Visit: Payer: Self-pay

## 2023-04-14 ENCOUNTER — Other Ambulatory Visit: Payer: Self-pay | Admitting: Hematology and Oncology

## 2023-04-14 ENCOUNTER — Inpatient Hospital Stay: Payer: 59 | Attending: Hematology and Oncology

## 2023-04-14 VITALS — BP 120/67 | HR 91 | Temp 98.3°F | Resp 16

## 2023-04-14 DIAGNOSIS — Z95828 Presence of other vascular implants and grafts: Secondary | ICD-10-CM

## 2023-04-14 DIAGNOSIS — Z170 Estrogen receptor positive status [ER+]: Secondary | ICD-10-CM | POA: Insufficient documentation

## 2023-04-14 DIAGNOSIS — C50211 Malignant neoplasm of upper-inner quadrant of right female breast: Secondary | ICD-10-CM | POA: Diagnosis present

## 2023-04-14 DIAGNOSIS — Z79899 Other long term (current) drug therapy: Secondary | ICD-10-CM | POA: Diagnosis not present

## 2023-04-14 DIAGNOSIS — Z853 Personal history of malignant neoplasm of breast: Secondary | ICD-10-CM

## 2023-04-14 DIAGNOSIS — Z5111 Encounter for antineoplastic chemotherapy: Secondary | ICD-10-CM | POA: Insufficient documentation

## 2023-04-14 MED ORDER — GOSERELIN ACETATE 3.6 MG ~~LOC~~ IMPL
3.6000 mg | DRUG_IMPLANT | Freq: Once | SUBCUTANEOUS | Status: AC
  Administered 2023-04-14: 3.6 mg via SUBCUTANEOUS
  Filled 2023-04-14: qty 3.6

## 2023-04-20 ENCOUNTER — Other Ambulatory Visit: Payer: Self-pay

## 2023-04-20 ENCOUNTER — Ambulatory Visit (HOSPITAL_COMMUNITY)
Admission: EM | Admit: 2023-04-20 | Discharge: 2023-04-20 | Disposition: A | Discharge status: Home / Self Care | Payer: Medicaid Other | Attending: Sports Medicine | Admitting: Sports Medicine

## 2023-04-20 ENCOUNTER — Encounter (HOSPITAL_COMMUNITY): Payer: Self-pay | Admitting: Emergency Medicine

## 2023-04-20 DIAGNOSIS — M79645 Pain in left finger(s): Secondary | ICD-10-CM | POA: Diagnosis not present

## 2023-04-20 LAB — CBC WITH DIFFERENTIAL/PLATELET
Abs Immature Granulocytes: 0.01 10*3/uL (ref 0.00–0.07)
Basophils Absolute: 0 10*3/uL (ref 0.0–0.1)
Basophils Relative: 1 %
Eosinophils Absolute: 0.2 10*3/uL (ref 0.0–0.5)
Eosinophils Relative: 4 %
HCT: 38.7 % (ref 36.0–46.0)
Hemoglobin: 12.4 g/dL (ref 12.0–15.0)
Immature Granulocytes: 0 %
Lymphocytes Relative: 40 %
Lymphs Abs: 2.1 10*3/uL (ref 0.7–4.0)
MCH: 21.5 pg — ABNORMAL LOW (ref 26.0–34.0)
MCHC: 32 g/dL (ref 30.0–36.0)
MCV: 67 fL — ABNORMAL LOW (ref 80.0–100.0)
Monocytes Absolute: 0.4 10*3/uL (ref 0.1–1.0)
Monocytes Relative: 8 %
Neutro Abs: 2.5 10*3/uL (ref 1.7–7.7)
Neutrophils Relative %: 47 %
Platelets: 293 10*3/uL (ref 150–400)
RBC: 5.78 MIL/uL — ABNORMAL HIGH (ref 3.87–5.11)
RDW: 15.9 % — ABNORMAL HIGH (ref 11.5–15.5)
WBC: 5.2 10*3/uL (ref 4.0–10.5)
nRBC: 0 % (ref 0.0–0.2)

## 2023-04-20 LAB — COMPREHENSIVE METABOLIC PANEL
ALT: 19 U/L (ref 0–44)
AST: 18 U/L (ref 15–41)
Albumin: 3.9 g/dL (ref 3.5–5.0)
Alkaline Phosphatase: 74 U/L (ref 38–126)
Anion gap: 12 (ref 5–15)
BUN: 9 mg/dL (ref 6–20)
CO2: 27 mmol/L (ref 22–32)
Calcium: 9.5 mg/dL (ref 8.9–10.3)
Chloride: 101 mmol/L (ref 98–111)
Creatinine, Ser: 0.87 mg/dL (ref 0.44–1.00)
GFR, Estimated: >60 mL/min (ref >60)
Glucose, Bld: 109 mg/dL — ABNORMAL HIGH (ref 70–99)
Potassium: 4 mmol/L (ref 3.5–5.1)
Sodium: 140 mmol/L (ref 135–145)
Total Bilirubin: 0.3 mg/dL (ref 0.3–1.2)
Total Protein: 7.8 g/dL (ref 6.5–8.1)

## 2023-04-20 MED ORDER — SULFAMETHOXAZOLE-TRIMETHOPRIM 800-160 MG PO TABS: 1.0000 | ORAL_TABLET | Freq: Two times a day (BID) | ORAL | 0 refills | Status: AC

## 2023-04-20 NOTE — ED Triage Notes (Signed)
Pt endorse drainage and blood coming from her cuticles on left hand nails.

## 2023-04-20 NOTE — Discharge Instructions (Addendum)
We will you with any abnormal blood work results and you will be able to view them on MyChart.  Should your values be abnormal I recommend follow-up with your hematology oncology doctor.  If all the results are normal would recommend follow-up with dermatology.  You can call the number on the back of your insurance card and ask for dermatologist locally that is in network.  I have sent to your pharmacy an antibiotic to cover for possible infection.  Follow-up.  The urgent care if symptoms worsen or fail to improve.

## 2023-04-20 NOTE — ED Provider Notes (Signed)
[facility]    CSN: [id number] Arrival date & time: 04/20/23  1109      History   Chief Complaint Chief Complaint  Patient presents with   Hand Problem    Pt endorse drainage and blood coming from her cuticles on left hand nails.    HPI Kim Griffin is a 27 y.o. female.   She is here today with chief complaint of bleeding from her cuticles of her left hand for the past week.  She reports initially she thought she had some pus coming from her cuticles but now noticed a steady bright red oozing from the cuticles.  She denies any trauma or biting or picking at her cuticles.  She notes she had a possible fracture of her fifth metacarpal back in March that caused some swelling to her hand which finally resolved just prior to the bleeding in her cuticles.  She has been keeping her cuticles off and Band-Aids and using Neosporin.  Past medical history includes breast cancer, in remission for the past 2 years.  She also has a past medical history of anemia.  She denies any easy bruising rashes or bleeding elsewhere.      Past Medical History:  Diagnosis Date   Anemia    takes iron supplement   Breast cancer, right ([facility]) 12/2017   History of chemotherapy    finished chemo 06/12/2018   Personal history of chemotherapy    Personal history of radiation therapy    Sickle cell trait [facility])     Patient Active Problem List   Diagnosis Date Noted   Port-A-Cath in place 03/28/2018   Genetic testing 02/08/2018   Malignant neoplasm of upper-inner quadrant of right breast in female, estrogen receptor positive ([facility]) 01/16/2018    Past Surgical History:  Procedure Laterality Date   BREAST LUMPECTOMY Right    BREAST LUMPECTOMY WITH RADIOACTIVE SEED AND SENTINEL LYMPH NODE BIOPSY Right 07/13/2018   Procedure: RIGHT BREAST LUMPECTOMY X 2 WITH RADIOACTIVE SEED X 2, RIGHT SEED TARGETED AXILLARY LYMPH NODE EXCISION AND RIGHT SENTINEL LYMPH NODE BIOPSY;  Surgeon: Ovidio Kin, MD;   Location: [facility];  Service: General;  Laterality: Right;   PORT-A-CATH REMOVAL Right 07/13/2018   Procedure: REMOVAL PORT-A-CATH;  Surgeon: Ovidio Kin, MD;  Location: [facility];  Service: General;  Laterality: Right;   PORTACATH PLACEMENT N/A 01/22/2018   Procedure: INSERTION PORT-A-CATH WITH ULTRASOUND ERAS PATHWAY;  Surgeon: Ovidio Kin, MD;  [facility];  Service: General;  Laterality: N/A;   WISDOM TOOTH EXTRACTION      OB History   No obstetric history on file.      Home Medications    Prior to Admission medications   Medication Sig Start Date End Date Taking? Authorizing Provider  sulfamethoxazole-trimethoprim (BACTRIM DS) 800-160 MG tablet Take 1 tablet by mouth 2 (two) times daily for 7 days. 04/20/23 04/27/23 Yes Heli Dino A, DO  ferrous sulfate 325 (65 FE) MG tablet Take 325 mg by mouth daily with breakfast.    [provider]  goserelin (ZOLADEX) 10.8 MG injection  01/24/19   [provider]  ibuprofen (ADVIL) 600 MG tablet Take 1 tablet (600 mg total) by mouth every 6 (six) hours as needed. 01/16/23   Rising, Lurena Joiner, PA-C  letrozole [facility]) 2.5 MG tablet Take 1 tablet (2.5 mg total) by mouth daily. 11/18/22   Serena Croissant, MD  meclizine (ANTIVERT) 25 MG tablet Take 1 tablet (25 mg total) by mouth  3 (three) times daily as needed for dizziness. 01/31/22   Zenia Resides, MD  ondansetron (ZOFRAN) 4 MG tablet Take 1 tablet (4 mg total) by mouth every 8 (eight) hours as needed for nausea or vomiting. 10/18/22   Ellsworth Lennox, PA-C    Family History Family History  Problem Relation Age of Onset   Lung cancer Other        2 of maternal grandmother's siblings   Breast cancer Cousin        2nd cousin    Social History Social History   Tobacco Use   Smoking status: Never   Smokeless tobacco: Never  Vaping Use   Vaping Use: Never used  Substance Use Topics   Alcohol use: No   Drug use: No      Allergies   Patient has no known allergies.   Review of Systems Review of Systems as listed above in HPI   Physical Exam Triage Vital Signs ED Triage Vitals  Enc Vitals Group     BP 04/20/23 1258 126/79     Pulse Rate 04/20/23 1258 82     Resp 04/20/23 1258 18     Temp 04/20/23 1258 98.2 F (36.8 C)     Temp Source 04/20/23 1258 Oral     SpO2 04/20/23 1258 99 %     Weight --      Height --      Head Circumference --      Peak Flow --      Pain Score 04/20/23 1259 4     Pain Loc --      Pain Edu? --      Excl. in [place]? --    No data found.  Updated Vital Signs BP 126/79 (BP Location: Right Arm)   Pulse 82   Temp 98.2 F (36.8 C) (Oral)   Resp 18   SpO2 99%   Physical Exam Vitals reviewed.  Constitutional:      General: She is not in acute distress.    Appearance: Normal appearance. She is not ill-appearing, toxic-appearing or diaphoretic.  Cardiovascular:     Rate and Rhythm: Normal rate.  Pulmonary:     Effort: Pulmonary effort is normal.  Skin:    General: Skin is warm.  Neurological:     Mental Status: She is alert.   Left hand: No obvious deformity or asymmetry.  She does have some hyperextension of her DIP joint.  She has some darkening of the skin just proximal to the DIP joints.  Is oozing from the cuticles and some yellow crusting around the cuticle of the index finger.  Full range of motion of the fingers.  Some tenderness to palpation.  No petechiae present on the extremities.  Patient may have very tiny faint hyperpigmented lesions on the roof of her mouth.   UC Treatments / Results  Labs (all labs ordered are listed, but only abnormal results are displayed) Labs Reviewed  CBC WITH DIFFERENTIAL/PLATELET  COMPREHENSIVE METABOLIC PANEL    EKG   Radiology No results found.  Procedures Procedures (including critical care time)  Medications Ordered in UC Medications - No data to display  Initial Impression / Assessment and Plan /  UC Course  I have reviewed the triage vital signs and the nursing notes.  Pertinent labs & imaging results that were available during my care of the patient were reviewed by me and considered in my medical decision making (see chart for details).  Atraumatic bleeding cuticles In light of patient's history of anemia and breast cancer we will collect baseline labs on her CBC and a CMP to check her blood count and platelets.  Recommend follow-up with her hematology oncology doctor if lab values are abnormal or dermatology if they are normal.  I also sent to her pharmacy some Bactrim to cover for possible infection as she had some purulent looking crusts.  Return to urgent care if symptoms worsen or fail to improve. Final Clinical Impressions(s) / UC Diagnoses   Final diagnoses:  None   Discharge Instructions   None    ED Prescriptions     Medication Sig Dispense Auth. Provider   sulfamethoxazole-trimethoprim (BACTRIM DS) 800-160 MG tablet Take 1 tablet by mouth 2 (two) times daily for 7 days. 14 tablet Gillermo Murdoch A, DO      PDMP not reviewed this encounter.   Claudie Leach, DO 04/20/23 1357

## 2023-04-28 ENCOUNTER — Ambulatory Visit
Admission: RE | Admit: 2023-04-28 | Discharge: 2023-04-28 | Disposition: A | Discharge status: Home / Self Care | Payer: Medicaid Other | Source: Ambulatory Visit | Attending: Hematology and Oncology | Admitting: Hematology and Oncology

## 2023-04-28 DIAGNOSIS — Z853 Personal history of malignant neoplasm of breast: Secondary | ICD-10-CM

## 2023-05-12 ENCOUNTER — Other Ambulatory Visit: Payer: Self-pay

## 2023-05-12 ENCOUNTER — Inpatient Hospital Stay: Payer: 59

## 2023-05-12 ENCOUNTER — Inpatient Hospital Stay: Payer: 59 | Attending: Hematology and Oncology

## 2023-05-12 ENCOUNTER — Telehealth: Payer: Self-pay | Admitting: Hematology and Oncology

## 2023-05-12 VITALS — BP 122/71 | HR 78 | Resp 18

## 2023-05-12 DIAGNOSIS — Z170 Estrogen receptor positive status [ER+]: Secondary | ICD-10-CM | POA: Insufficient documentation

## 2023-05-12 DIAGNOSIS — C50211 Malignant neoplasm of upper-inner quadrant of right female breast: Secondary | ICD-10-CM | POA: Diagnosis present

## 2023-05-12 DIAGNOSIS — Z5111 Encounter for antineoplastic chemotherapy: Secondary | ICD-10-CM | POA: Insufficient documentation

## 2023-05-12 DIAGNOSIS — Z79899 Other long term (current) drug therapy: Secondary | ICD-10-CM | POA: Diagnosis not present

## 2023-05-12 DIAGNOSIS — Z95828 Presence of other vascular implants and grafts: Secondary | ICD-10-CM

## 2023-05-12 MED ORDER — GOSERELIN ACETATE 3.6 MG ~~LOC~~ IMPL
3.6000 mg | DRUG_IMPLANT | Freq: Once | SUBCUTANEOUS | Status: AC
  Administered 2023-05-12: 3.6 mg via SUBCUTANEOUS
  Filled 2023-05-12: qty 3.6

## 2023-05-12 NOTE — Patient Instructions (Signed)
Goserelin Implant What is this medication? GOSERELIN (GOE se rel in) treats prostate cancer and breast cancer. It works by decreasing levels of the hormones testosterone and estrogen in the body. This prevents prostate and breast cancer cells from spreading or growing. It may also be used to treat endometriosis. This is a condition where the tissue that lines the uterus grows outside the uterus. It works by decreasing the amount of estrogen your body makes, which reduces heavy bleeding and pain. It can also be used to help thin the lining of the uterus before a surgery used to prevent or reduce heavy periods. This medicine may be used for other purposes; ask your health care provider or pharmacist if you have questions. COMMON BRAND NAME(S): Zoladex, Zoladex 3-Month What should I tell my care team before I take this medication? They need to know if you have any of these conditions: Bone problems Diabetes Heart disease History of irregular heartbeat or rhythm An unusual or allergic reaction to goserelin, other medications, foods, dyes, or preservatives Pregnant or trying to get pregnant Breastfeeding How should I use this medication? This medication is injected under the skin. It is given by your care team in a hospital or clinic setting. Talk to your care team about the use of this medication in children. Special care may be needed. Overdosage: If you think you have taken too much of this medicine contact a poison control center or emergency room at once. NOTE: This medicine is only for you. Do not share this medicine with others. What if I miss a dose? Keep appointments for follow-up doses. It is important not to miss your dose. Call your care team if you are unable to keep an appointment. What may interact with this medication? Do not take this medication with any of the following: Cisapride Dronedarone Pimozide Thioridazine This medication may also interact with the following: Other  medications that cause heart rhythm changes This list may not describe all possible interactions. Give your health care provider a list of all the medicines, herbs, non-prescription drugs, or dietary supplements you use. Also tell them if you smoke, drink alcohol, or use illegal drugs. Some items may interact with your medicine. What should I watch for while using this medication? Visit your care team for regular checks on your progress. Your symptoms may appear to get worse during the first weeks of this therapy. Tell your care team if your symptoms do not start to get better or if they get worse after this time. Using this medication for a long time may weaken your bones. If you smoke or frequently drink alcohol you may increase your risk of bone loss. A family history of osteoporosis, chronic use of medications for seizures (convulsions), or corticosteroids can also increase your risk of bone loss. The risk of bone fractures may be increased. Talk to your care team about your bone health. This medication may increase blood sugar. The risk may be higher in patients who already have diabetes. Ask your care team what you can do to lower your risk of diabetes while taking this medication. This medication should stop regular monthly menstruation in women. Tell your care team if you continue to menstruate. Talk to your care team if you wish to become pregnant or think you might be pregnant. This medication can cause serious birth defects if taken during pregnancy or for 12 weeks after stopping treatment. Talk to your care team about reliable forms of contraception. Do not breastfeed while taking this   medication. This medication may cause infertility. Talk to your care team if you are concerned about your fertility. What side effects may I notice from receiving this medication? Side effects that you should report to your care team as soon as possible: Allergic reactions--skin rash, itching, hives, swelling  of the face, lips, tongue, or throat Change in the amount of urine Heart attack--pain or tightness in the chest, shoulders, arms, or jaw, nausea, shortness of breath, cold or clammy skin, feeling faint or lightheaded Heart rhythm changes--fast or irregular heartbeat, dizziness, feeling faint or lightheaded, chest pain, trouble breathing High blood sugar (hyperglycemia)--increased thirst or amount of urine, unusual weakness or fatigue, blurry vision High calcium level--increased thirst or amount of urine, nausea, vomiting, confusion, unusual weakness or fatigue, bone pain Pain, redness, irritation, or bruising at the injection site Severe back pain, numbness or weakness of the hands, arms, legs, or feet, loss of coordination, loss of bowel or bladder control Stroke--sudden numbness or weakness of the face, arm, or leg, trouble speaking, confusion, trouble walking, loss of balance or coordination, dizziness, severe headache, change in vision Swelling and pain of the tumor site or lymph nodes Trouble passing urine Side effects that usually do not require medical attention (report to your care team if they continue or are bothersome): Change in sex drive or performance Headache Hot flashes Rapid or extreme change in emotion or mood Sweating Swelling of the ankles, hands, or feet Unusual vaginal discharge, itching, or odor This list may not describe all possible side effects. Call your doctor for medical advice about side effects. You may report side effects to FDA at 1-800-FDA-1088. Where should I keep my medication? This medication is given in a hospital or clinic. It will not be stored at home. NOTE: This sheet is a summary. It may not cover all possible information. If you have questions about this medicine, talk to your doctor, pharmacist, or health care provider.  2023 Elsevier/Gold Standard (2022-03-02 00:00:00)  

## 2023-06-09 ENCOUNTER — Inpatient Hospital Stay: Payer: 59 | Attending: Hematology and Oncology

## 2023-06-09 ENCOUNTER — Other Ambulatory Visit: Payer: Self-pay

## 2023-06-09 VITALS — BP 99/73 | HR 95 | Temp 98.3°F | Resp 16

## 2023-06-09 DIAGNOSIS — Z170 Estrogen receptor positive status [ER+]: Secondary | ICD-10-CM | POA: Insufficient documentation

## 2023-06-09 DIAGNOSIS — Z79899 Other long term (current) drug therapy: Secondary | ICD-10-CM | POA: Insufficient documentation

## 2023-06-09 DIAGNOSIS — Z5111 Encounter for antineoplastic chemotherapy: Secondary | ICD-10-CM | POA: Diagnosis not present

## 2023-06-09 DIAGNOSIS — C50211 Malignant neoplasm of upper-inner quadrant of right female breast: Secondary | ICD-10-CM | POA: Diagnosis not present

## 2023-06-09 DIAGNOSIS — Z95828 Presence of other vascular implants and grafts: Secondary | ICD-10-CM

## 2023-06-09 MED ORDER — GOSERELIN ACETATE 3.6 MG ~~LOC~~ IMPL
3.6000 mg | DRUG_IMPLANT | Freq: Once | SUBCUTANEOUS | Status: AC
  Administered 2023-06-09: 3.6 mg via SUBCUTANEOUS
  Filled 2023-06-09: qty 3.6

## 2023-07-07 ENCOUNTER — Inpatient Hospital Stay: Payer: 59 | Attending: Hematology and Oncology

## 2023-07-07 VITALS — BP 112/70 | HR 82 | Temp 98.0°F | Resp 16

## 2023-07-07 DIAGNOSIS — Z95828 Presence of other vascular implants and grafts: Secondary | ICD-10-CM

## 2023-07-07 DIAGNOSIS — Z170 Estrogen receptor positive status [ER+]: Secondary | ICD-10-CM | POA: Insufficient documentation

## 2023-07-07 DIAGNOSIS — C50211 Malignant neoplasm of upper-inner quadrant of right female breast: Secondary | ICD-10-CM | POA: Insufficient documentation

## 2023-07-07 DIAGNOSIS — Z79899 Other long term (current) drug therapy: Secondary | ICD-10-CM | POA: Insufficient documentation

## 2023-07-07 MED ORDER — GOSERELIN ACETATE 3.6 MG ~~LOC~~ IMPL
3.6000 mg | DRUG_IMPLANT | Freq: Once | SUBCUTANEOUS | Status: AC
  Administered 2023-07-07: 3.6 mg via SUBCUTANEOUS
  Filled 2023-07-07: qty 3.6

## 2023-07-11 ENCOUNTER — Encounter: Payer: Self-pay | Admitting: Hematology and Oncology

## 2023-07-12 ENCOUNTER — Encounter: Payer: Self-pay | Admitting: Hematology and Oncology

## 2023-07-27 ENCOUNTER — Ambulatory Visit: Payer: Self-pay | Admitting: Hematology and Oncology

## 2023-07-28 ENCOUNTER — Encounter: Payer: Self-pay | Admitting: Hematology and Oncology

## 2023-08-04 ENCOUNTER — Inpatient Hospital Stay: Payer: 59 | Attending: Hematology and Oncology | Admitting: Hematology and Oncology

## 2023-08-04 ENCOUNTER — Encounter: Payer: Self-pay | Admitting: Hematology and Oncology

## 2023-08-04 ENCOUNTER — Inpatient Hospital Stay: Payer: 59

## 2023-08-04 VITALS — BP 107/66 | Ht 61.0 in | Wt 138.0 lb

## 2023-08-04 DIAGNOSIS — R232 Flushing: Secondary | ICD-10-CM | POA: Diagnosis not present

## 2023-08-04 DIAGNOSIS — Z79899 Other long term (current) drug therapy: Secondary | ICD-10-CM | POA: Diagnosis not present

## 2023-08-04 DIAGNOSIS — Z170 Estrogen receptor positive status [ER+]: Secondary | ICD-10-CM | POA: Diagnosis not present

## 2023-08-04 DIAGNOSIS — R5383 Other fatigue: Secondary | ICD-10-CM | POA: Insufficient documentation

## 2023-08-04 DIAGNOSIS — Z95828 Presence of other vascular implants and grafts: Secondary | ICD-10-CM

## 2023-08-04 DIAGNOSIS — Z8049 Family history of malignant neoplasm of other genital organs: Secondary | ICD-10-CM | POA: Insufficient documentation

## 2023-08-04 DIAGNOSIS — C50211 Malignant neoplasm of upper-inner quadrant of right female breast: Secondary | ICD-10-CM | POA: Diagnosis not present

## 2023-08-04 DIAGNOSIS — Z79811 Long term (current) use of aromatase inhibitors: Secondary | ICD-10-CM | POA: Diagnosis not present

## 2023-08-04 MED ORDER — GOSERELIN ACETATE 3.6 MG ~~LOC~~ IMPL
3.6000 mg | DRUG_IMPLANT | Freq: Once | SUBCUTANEOUS | Status: AC
  Administered 2023-08-04: 3.6 mg via SUBCUTANEOUS
  Filled 2023-08-04: qty 3.6

## 2023-08-04 MED ORDER — LETROZOLE 2.5 MG PO TABS: 2.5000 mg | ORAL_TABLET | Freq: Every day | ORAL | 3 refills | Status: AC

## 2023-08-04 NOTE — Progress Notes (Signed)
Patient Care Team: Maryagnes Amos, FNP as PCP - General (Nurse Practitioner) Serena Croissant, MD as Consulting Physician (Hematology and Oncology) Ovidio Kin, MD as Consulting Physician (General Surgery) Antony Blackbird, MD as Consulting Physician (Radiation Oncology)  DIAGNOSIS:  Encounter Diagnosis  Name Primary?   Malignant neoplasm of upper-inner quadrant of right breast in female, estrogen receptor positive (HCC) Yes    SUMMARY OF ONCOLOGIC HISTORY: Oncology History  Malignant neoplasm of upper-inner quadrant of right breast in female, estrogen receptor positive (HCC)  01/10/2018 Initial Diagnosis   Palpable right breast mass with calcifications medial right breast at 1 o'clock position: 2.6 cm by ultrasound, 1 abnormal lymph node: Ultrasound biopsy of both the mass and the lymph node revealed grade 3 invasive ductal carcinoma ER 90%, PR 2%, Ki-67 70%, HER-2 negative ratio 0.96, T2 N1 stage II a AJCC 8 clinical stage   01/17/2018 Cancer Staging   Staging form: Breast, AJCC 8th Edition - Clinical stage from 01/17/2018: Stage IIB (cT2, cN1, cM0, G3, ER+, PR+, HER2-) - Signed by Loa Socks, NP on 06/20/2018   01/29/2018 - 06/12/2018 Neo-Adjuvant Chemotherapy   Dose dense Adriamycin and Cytoxan followed by Taxol x12  Zoladex started with chemo   02/06/2018 Genetic Testing   UPDATE: CDK4 [id number] (Silent) VUS was amended to likely benign due to a re-review of the evidence in light of new variant interpretation guidelines and/or new information.  Multi-Cancer panel (83 genes) @ [facility] - No pathogenic mutations detected Variant of Uncertain Significance in CDK4  Genes Analyzed: 83 genes on [facility]'s Multi-Cancer panel (ALK, APC, ATM, AXIN2, BAP1, BARD1, BLM, BMPR1A, BRCA1, BRCA2, BRIP1, CASR, CDC73, CDH1, CDK4, CDKN1B, CDKN1C, CDKN2A, CEBPA, CHEK2, CTNNA1, DICER1, DIS3L2, EGFR, EPCAM, FH, FLCN, GATA2, GPC3, GREM1, HOXB13, HRAS, KIT, MAX, MEN1, MET, MITF, MLH1, MSH2,  MSH3, MSH6, MUTYH, NBN, NF1, NF2, NTHL1, PALB2, PDGFRA, PHOX2B, PMS2, POLD1, POLE, POT1, PRKAR1A, PTCH1, PTEN, RAD50, RAD51C, RAD51D, RB1, RECQL4, RET, RUNX1, SDHA, SDHAF2, SDHB, SDHC, SDHD, SMAD4, SMARCA4, SMARCB1, SMARCE1, STK11, SUFU, TERC, TERT, TMEM127, TP53, TSC1, TSC2, VHL, WRN, WT1).   02/27/2018 Miscellaneous   Genetics negative   07/13/2018 Surgery   Right lumpectomy: Pathologic complete response 03 lymph nodes negative   08/23/2018 - 10/09/2018 Radiation Therapy   1. Right axilla; 1.8 Gy in 25 fractions for a total of 45 Gy                      2. Right breast; 1.8 Gy in 28 fractions for a total of 50.4 Gy                      3. Lumpectomy Boost; 2 Gy in 5 fractions for a total of 10 Gy   09/2018 -  Anti-estrogen oral therapy   Letrozole     CHIEF COMPLIANT:   History of Present Illness   The patient, a breast cancer survivor, presents for a routine follow-up. She recently completed a mammogram, which was delayed due to difficulties in scheduling. The patient is also due for an MRI, but due to the cost and inconvenience, the doctor proposes a contrast mammogram for the following year. This procedure involves an iodinated contrast and is said to provide imaging quality similar to an MRI at a fraction of the cost.  The patient is currently on letrozole and receives monthly Zoladex injections. She reports occasional fatigue and hot flashes but is generally tolerating the treatment well. It has been approximately five and a  half years since her breast cancer diagnosis and she has completed chemotherapy and lumpectomy.  In addition to her own health, the patient is dealing with family health issues. Her grandmother was recently diagnosed with cervical cancer, which has been a source of anxiety. The patient is close to her grandmother and is concerned about her upcoming surgery, chemotherapy, and radiation treatments.        ALLERGIES:  has No Known Allergies.  MEDICATIONS:   Current Outpatient Medications  Medication Sig Dispense Refill   ferrous sulfate 325 (65 FE) MG tablet Take 325 mg by mouth daily with breakfast.     goserelin (ZOLADEX) 10.8 MG injection      ibuprofen (ADVIL) 600 MG tablet Take 1 tablet (600 mg total) by mouth every 6 (six) hours as needed. 30 tablet 0   letrozole (FEMARA) 2.5 MG tablet Take 1 tablet (2.5 mg total) by mouth daily. 90 tablet 3   meclizine (ANTIVERT) 25 MG tablet Take 1 tablet (25 mg total) by mouth 3 (three) times daily as needed for dizziness. 30 tablet 0   ondansetron (ZOFRAN) 4 MG tablet Take 1 tablet (4 mg total) by mouth every 8 (eight) hours as needed for nausea or vomiting. 20 tablet 0   No current facility-administered medications for this visit.    PHYSICAL EXAMINATION: ECOG PERFORMANCE STATUS: 1 - Symptomatic but completely ambulatory  Vitals:   08/04/23 0942  BP: 107/66   Filed Weights   08/04/23 0942  Weight: 138 lb (62.6 kg)     LABORATORY DATA:  I have reviewed the data as listed    Latest Ref Rng & Units 04/20/2023    1:26 PM 01/31/2022    7:49 PM 06/12/2018    1:28 PM  CMP  Glucose 70 - 99 mg/dL 161  096  045   BUN 6 - 20 mg/dL 9  7  9    Creatinine 0.44 - 1.00 mg/dL 4.09  8.11  9.14   Sodium 135 - 145 mmol/L 140  138  139   Potassium 3.5 - 5.1 mmol/L 4.0  3.6  3.6   Chloride 98 - 111 mmol/L 101  102  102   CO2 22 - 32 mmol/L 27  27  25    Calcium 8.9 - 10.3 mg/dL 9.5  9.1  9.3   Total Protein 6.5 - 8.1 g/dL 7.8  7.5  6.9   Total Bilirubin 0.3 - 1.2 mg/dL 0.3  0.4  0.3   Alkaline Phos 38 - 126 U/L 74  75  69   AST 15 - 41 U/L 18  19  17    ALT 0 - 44 U/L 19  17  26      Lab Results  Component Value Date   WBC 5.2 04/20/2023   HGB 12.4 04/20/2023   HCT 38.7 04/20/2023   MCV 67.0 (L) 04/20/2023   PLT 293 04/20/2023   NEUTROABS 2.5 04/20/2023    ASSESSMENT & PLAN:  Malignant neoplasm of upper-inner quadrant of right breast in female, estrogen receptor positive  (HCC) 01/10/2018:Palpable right breast mass with calcifications medial right breast at 1 o'clock position: 2.6 cm by ultrasound, 1 abnormal lymph node: Ultrasound biopsy of both the mass and the lymph node revealed grade 3 invasive ductal carcinoma ER 90%, PR 2%, Ki-67 70%, HER-2 negative ratio 0.96, T2 N1 stage II a AJCC 8 clinical stage   Recommendation: 1 Genetic testing: Negative.  Staging scans: No metastatic disease 2. neoadjuvant chemotherapy with dose dense Adriamycin and Cytoxan  followed by Taxol weekly x12 3.  Followed by breast conserving surgery on 07/13/2018: Pathologic complete response 4.  Followed by adjuvant radiation started 08/24/2018   5.  Followed by antiestrogen therapy and evaluation on Natalee clinical trial --------------------------------------------------------------------- Recommendation: Antiestrogen therapy with ovarian suppression and letrozole started 09/25/2018    Toxicities: Patient is tolerating the treatment extremely well.  Does not have any significant hot flashes or arthralgias or myalgias.  Energy levels go up and down.   Breast cancer surveillance: 1.  Mammogram 04/28/2023: Benign breast density category C 2.  Breast MRI was recommended but not done because of cost reasons I recommended contrast-enhanced mammograms instead.   Psychosocial Stress Expressed anxiety related to personal health and recent diagnosis of cervical cancer in close family member. -Encouraged patient to maintain open communication and seek support as needed.  General Health Maintenance / Followup Plans -Continue monthly Zoladex injections.      Return to clinic monthly for injections and I will see her back in 1 year Return to clinic in 1 year for follow-up     Orders Placed This Encounter  Procedures   MM 2D DIAG BILAT WITH CONTRAST BCG ONLY    Standing Status:   Future    Standing Expiration Date:   08/03/2024    Order Specific Question:   Reason for Exam (SYMPTOM  OR  DIAGNOSIS REQUIRED)    Answer:   High risk breast cancer    Order Specific Question:   If indicated for the ordered procedure, I authorize the administration of contrast media per Radiology protocol    Answer:   Yes    Order Specific Question:   Does the patient have a contrast media/X-ray dye allergy?    Answer:   No    Order Specific Question:   Is the patient pregnant?    Answer:   No    Order Specific Question:   Preferred Imaging Location?    Answer:   [facility]    Order Specific Question:   Release to patient    Answer:   Immediate   The patient has a good understanding of the overall plan. she agrees with it. she will call with any problems that may develop before the next visit here. Total time spent: 30 mins including face to face time and time spent for planning, charting and co-ordination of care   Tamsen Meek, MD 08/04/23

## 2023-08-04 NOTE — Assessment & Plan Note (Signed)
01/10/2018:Palpable right breast mass with calcifications medial right breast at 1 o'clock position: 2.6 cm by ultrasound, 1 abnormal lymph node: Ultrasound biopsy of both the mass and the lymph node revealed grade 3 invasive ductal carcinoma ER 90%, PR 2%, Ki-67 70%, HER-2 negative ratio 0.96, T2 N1 stage II a AJCC 8 clinical stage   Recommendation: 1 Genetic testing: Negative.  Staging scans: No metastatic disease 2. neoadjuvant chemotherapy with dose dense Adriamycin and Cytoxan followed by Taxol weekly x12 3.  Followed by breast conserving surgery on 07/13/2018: Pathologic complete response 4.  Followed by adjuvant radiation started 08/24/2018   5.  Followed by antiestrogen therapy and evaluation on Natalee clinical trial --------------------------------------------------------------------- Recommendation: Antiestrogen therapy with ovarian suppression and letrozole started 09/25/2018    Toxicities: Patient is tolerating the treatment extremely well.  Does not have any significant hot flashes or arthralgias or myalgias.  Energy levels go up and down.   Breast cancer surveillance: 1.  Mammogram 04/28/2023: Benign breast density category C 2.  Breast MRI was recommended but not done because of cost reasons I recommended contrast-enhanced mammograms instead.   Return to clinic monthly for injections and I will see her back in 1 year Return to clinic in 1 year for follow-up

## 2023-08-07 ENCOUNTER — Telehealth: Payer: Self-pay | Admitting: Hematology and Oncology

## 2023-09-01 ENCOUNTER — Inpatient Hospital Stay: Payer: 59 | Attending: Hematology and Oncology

## 2023-09-01 VITALS — BP 100/80 | Temp 98.5°F | Resp 16

## 2023-09-01 DIAGNOSIS — Z95828 Presence of other vascular implants and grafts: Secondary | ICD-10-CM

## 2023-09-01 DIAGNOSIS — Z170 Estrogen receptor positive status [ER+]: Secondary | ICD-10-CM | POA: Insufficient documentation

## 2023-09-01 DIAGNOSIS — R5383 Other fatigue: Secondary | ICD-10-CM | POA: Insufficient documentation

## 2023-09-01 DIAGNOSIS — Z8049 Family history of malignant neoplasm of other genital organs: Secondary | ICD-10-CM | POA: Insufficient documentation

## 2023-09-01 DIAGNOSIS — C50211 Malignant neoplasm of upper-inner quadrant of right female breast: Secondary | ICD-10-CM | POA: Insufficient documentation

## 2023-09-01 DIAGNOSIS — Z79811 Long term (current) use of aromatase inhibitors: Secondary | ICD-10-CM | POA: Insufficient documentation

## 2023-09-01 DIAGNOSIS — Z79899 Other long term (current) drug therapy: Secondary | ICD-10-CM | POA: Diagnosis not present

## 2023-09-01 DIAGNOSIS — R232 Flushing: Secondary | ICD-10-CM | POA: Diagnosis not present

## 2023-09-01 MED ORDER — GOSERELIN ACETATE 3.6 MG ~~LOC~~ IMPL
3.6000 mg | DRUG_IMPLANT | Freq: Once | SUBCUTANEOUS | Status: AC
  Administered 2023-09-01: 3.6 mg via SUBCUTANEOUS
  Filled 2023-09-01: qty 3.6

## 2023-09-29 ENCOUNTER — Inpatient Hospital Stay: Payer: 59

## 2023-09-29 VITALS — BP 110/67 | HR 80 | Temp 98.0°F | Resp 18

## 2023-09-29 DIAGNOSIS — C50211 Malignant neoplasm of upper-inner quadrant of right female breast: Secondary | ICD-10-CM | POA: Diagnosis not present

## 2023-09-29 DIAGNOSIS — Z95828 Presence of other vascular implants and grafts: Secondary | ICD-10-CM

## 2023-09-29 MED ORDER — GOSERELIN ACETATE 3.6 MG ~~LOC~~ IMPL
3.6000 mg | DRUG_IMPLANT | Freq: Once | SUBCUTANEOUS | Status: AC
  Administered 2023-09-29: 3.6 mg via SUBCUTANEOUS
  Filled 2023-09-29: qty 3.6

## 2023-10-27 ENCOUNTER — Inpatient Hospital Stay: Payer: 59 | Attending: Hematology and Oncology

## 2023-10-27 VITALS — BP 114/63 | HR 77 | Temp 98.0°F | Resp 16

## 2023-10-27 DIAGNOSIS — R5383 Other fatigue: Secondary | ICD-10-CM | POA: Diagnosis not present

## 2023-10-27 DIAGNOSIS — Z95828 Presence of other vascular implants and grafts: Secondary | ICD-10-CM

## 2023-10-27 DIAGNOSIS — Z8049 Family history of malignant neoplasm of other genital organs: Secondary | ICD-10-CM | POA: Diagnosis not present

## 2023-10-27 DIAGNOSIS — Z79811 Long term (current) use of aromatase inhibitors: Secondary | ICD-10-CM | POA: Insufficient documentation

## 2023-10-27 DIAGNOSIS — Z79899 Other long term (current) drug therapy: Secondary | ICD-10-CM | POA: Insufficient documentation

## 2023-10-27 DIAGNOSIS — R232 Flushing: Secondary | ICD-10-CM | POA: Insufficient documentation

## 2023-10-27 DIAGNOSIS — Z170 Estrogen receptor positive status [ER+]: Secondary | ICD-10-CM | POA: Insufficient documentation

## 2023-10-27 DIAGNOSIS — C50211 Malignant neoplasm of upper-inner quadrant of right female breast: Secondary | ICD-10-CM | POA: Diagnosis present

## 2023-10-27 MED ORDER — GOSERELIN ACETATE 3.6 MG ~~LOC~~ IMPL
3.6000 mg | DRUG_IMPLANT | Freq: Once | SUBCUTANEOUS | Status: AC
  Administered 2023-10-27: 3.6 mg via SUBCUTANEOUS
  Filled 2023-10-27: qty 3.6

## 2023-11-24 ENCOUNTER — Inpatient Hospital Stay: Payer: 59 | Attending: Hematology and Oncology

## 2023-11-24 VITALS — BP 110/66 | HR 81 | Temp 98.4°F | Resp 16

## 2023-11-24 DIAGNOSIS — C50211 Malignant neoplasm of upper-inner quadrant of right female breast: Secondary | ICD-10-CM | POA: Insufficient documentation

## 2023-11-24 DIAGNOSIS — R5383 Other fatigue: Secondary | ICD-10-CM | POA: Insufficient documentation

## 2023-11-24 DIAGNOSIS — Z79899 Other long term (current) drug therapy: Secondary | ICD-10-CM | POA: Insufficient documentation

## 2023-11-24 DIAGNOSIS — R232 Flushing: Secondary | ICD-10-CM | POA: Diagnosis not present

## 2023-11-24 DIAGNOSIS — Z8049 Family history of malignant neoplasm of other genital organs: Secondary | ICD-10-CM | POA: Diagnosis not present

## 2023-11-24 DIAGNOSIS — Z95828 Presence of other vascular implants and grafts: Secondary | ICD-10-CM

## 2023-11-24 DIAGNOSIS — Z79811 Long term (current) use of aromatase inhibitors: Secondary | ICD-10-CM | POA: Insufficient documentation

## 2023-11-24 DIAGNOSIS — Z170 Estrogen receptor positive status [ER+]: Secondary | ICD-10-CM | POA: Diagnosis not present

## 2023-11-24 MED ORDER — GOSERELIN ACETATE 3.6 MG ~~LOC~~ IMPL
3.6000 mg | DRUG_IMPLANT | Freq: Once | SUBCUTANEOUS | Status: AC
  Administered 2023-11-24: 3.6 mg via SUBCUTANEOUS
  Filled 2023-11-24: qty 3.6

## 2023-12-22 ENCOUNTER — Inpatient Hospital Stay: Payer: 59 | Attending: Hematology and Oncology

## 2023-12-22 VITALS — BP 107/65 | HR 91 | Temp 97.9°F | Resp 18

## 2023-12-22 DIAGNOSIS — Z79811 Long term (current) use of aromatase inhibitors: Secondary | ICD-10-CM | POA: Insufficient documentation

## 2023-12-22 DIAGNOSIS — Z170 Estrogen receptor positive status [ER+]: Secondary | ICD-10-CM | POA: Diagnosis not present

## 2023-12-22 DIAGNOSIS — Z8049 Family history of malignant neoplasm of other genital organs: Secondary | ICD-10-CM | POA: Diagnosis not present

## 2023-12-22 DIAGNOSIS — R232 Flushing: Secondary | ICD-10-CM | POA: Diagnosis not present

## 2023-12-22 DIAGNOSIS — C50211 Malignant neoplasm of upper-inner quadrant of right female breast: Secondary | ICD-10-CM | POA: Insufficient documentation

## 2023-12-22 DIAGNOSIS — R5383 Other fatigue: Secondary | ICD-10-CM | POA: Insufficient documentation

## 2023-12-22 DIAGNOSIS — Z79899 Other long term (current) drug therapy: Secondary | ICD-10-CM | POA: Diagnosis not present

## 2023-12-22 DIAGNOSIS — Z95828 Presence of other vascular implants and grafts: Secondary | ICD-10-CM

## 2023-12-22 MED ORDER — GOSERELIN ACETATE 3.6 MG ~~LOC~~ IMPL
3.6000 mg | DRUG_IMPLANT | Freq: Once | SUBCUTANEOUS | Status: AC
  Administered 2023-12-22: 3.6 mg via SUBCUTANEOUS
  Filled 2023-12-22: qty 3.6

## 2024-01-19 ENCOUNTER — Inpatient Hospital Stay: Payer: 59 | Attending: Hematology and Oncology

## 2024-01-19 VITALS — BP 112/67 | HR 81 | Temp 98.3°F | Resp 17

## 2024-01-19 DIAGNOSIS — Z170 Estrogen receptor positive status [ER+]: Secondary | ICD-10-CM | POA: Insufficient documentation

## 2024-01-19 DIAGNOSIS — Z95828 Presence of other vascular implants and grafts: Secondary | ICD-10-CM

## 2024-01-19 DIAGNOSIS — Z79899 Other long term (current) drug therapy: Secondary | ICD-10-CM | POA: Diagnosis not present

## 2024-01-19 DIAGNOSIS — R5383 Other fatigue: Secondary | ICD-10-CM | POA: Insufficient documentation

## 2024-01-19 DIAGNOSIS — C50211 Malignant neoplasm of upper-inner quadrant of right female breast: Secondary | ICD-10-CM | POA: Insufficient documentation

## 2024-01-19 DIAGNOSIS — Z8049 Family history of malignant neoplasm of other genital organs: Secondary | ICD-10-CM | POA: Insufficient documentation

## 2024-01-19 DIAGNOSIS — R232 Flushing: Secondary | ICD-10-CM | POA: Insufficient documentation

## 2024-01-19 DIAGNOSIS — Z79811 Long term (current) use of aromatase inhibitors: Secondary | ICD-10-CM | POA: Diagnosis not present

## 2024-01-19 MED ORDER — GOSERELIN ACETATE 3.6 MG ~~LOC~~ IMPL
3.6000 mg | DRUG_IMPLANT | Freq: Once | SUBCUTANEOUS | Status: AC
  Administered 2024-01-19: 3.6 mg via SUBCUTANEOUS
  Filled 2024-01-19: qty 3.6

## 2024-02-15 ENCOUNTER — Encounter: Payer: Self-pay | Admitting: Hematology and Oncology

## 2024-02-16 ENCOUNTER — Inpatient Hospital Stay: Payer: 59 | Attending: Hematology and Oncology

## 2024-02-16 VITALS — BP 114/78 | HR 93 | Temp 98.4°F | Resp 18

## 2024-02-16 DIAGNOSIS — Z79899 Other long term (current) drug therapy: Secondary | ICD-10-CM | POA: Diagnosis not present

## 2024-02-16 DIAGNOSIS — R5383 Other fatigue: Secondary | ICD-10-CM | POA: Diagnosis not present

## 2024-02-16 DIAGNOSIS — Z79811 Long term (current) use of aromatase inhibitors: Secondary | ICD-10-CM | POA: Insufficient documentation

## 2024-02-16 DIAGNOSIS — C50211 Malignant neoplasm of upper-inner quadrant of right female breast: Secondary | ICD-10-CM | POA: Insufficient documentation

## 2024-02-16 DIAGNOSIS — Z170 Estrogen receptor positive status [ER+]: Secondary | ICD-10-CM | POA: Diagnosis not present

## 2024-02-16 DIAGNOSIS — Z8049 Family history of malignant neoplasm of other genital organs: Secondary | ICD-10-CM | POA: Diagnosis not present

## 2024-02-16 DIAGNOSIS — R232 Flushing: Secondary | ICD-10-CM | POA: Insufficient documentation

## 2024-02-16 DIAGNOSIS — Z95828 Presence of other vascular implants and grafts: Secondary | ICD-10-CM

## 2024-02-16 MED ORDER — GOSERELIN ACETATE 3.6 MG ~~LOC~~ IMPL
3.6000 mg | DRUG_IMPLANT | Freq: Once | SUBCUTANEOUS | Status: AC
  Administered 2024-02-16: 3.6 mg via SUBCUTANEOUS
  Filled 2024-02-16: qty 3.6

## 2024-03-15 ENCOUNTER — Inpatient Hospital Stay: Payer: 59 | Attending: Hematology and Oncology

## 2024-03-15 VITALS — BP 118/73 | HR 88 | Temp 98.5°F | Resp 17

## 2024-03-15 DIAGNOSIS — Z79811 Long term (current) use of aromatase inhibitors: Secondary | ICD-10-CM | POA: Insufficient documentation

## 2024-03-15 DIAGNOSIS — C50211 Malignant neoplasm of upper-inner quadrant of right female breast: Secondary | ICD-10-CM | POA: Insufficient documentation

## 2024-03-15 DIAGNOSIS — Z8049 Family history of malignant neoplasm of other genital organs: Secondary | ICD-10-CM | POA: Diagnosis not present

## 2024-03-15 DIAGNOSIS — Z170 Estrogen receptor positive status [ER+]: Secondary | ICD-10-CM | POA: Insufficient documentation

## 2024-03-15 DIAGNOSIS — R232 Flushing: Secondary | ICD-10-CM | POA: Insufficient documentation

## 2024-03-15 DIAGNOSIS — R5383 Other fatigue: Secondary | ICD-10-CM | POA: Insufficient documentation

## 2024-03-15 DIAGNOSIS — Z95828 Presence of other vascular implants and grafts: Secondary | ICD-10-CM

## 2024-03-15 DIAGNOSIS — Z79899 Other long term (current) drug therapy: Secondary | ICD-10-CM | POA: Insufficient documentation

## 2024-03-15 MED ORDER — GOSERELIN ACETATE 3.6 MG ~~LOC~~ IMPL
3.6000 mg | DRUG_IMPLANT | Freq: Once | SUBCUTANEOUS | Status: AC
  Administered 2024-03-15: 3.6 mg via SUBCUTANEOUS
  Filled 2024-03-15: qty 3.6

## 2024-04-10 ENCOUNTER — Encounter: Payer: Self-pay | Admitting: Hematology and Oncology

## 2024-04-12 ENCOUNTER — Inpatient Hospital Stay: Payer: 59 | Attending: Hematology and Oncology

## 2024-04-12 VITALS — BP 134/70 | HR 93 | Resp 16

## 2024-04-12 DIAGNOSIS — R232 Flushing: Secondary | ICD-10-CM | POA: Insufficient documentation

## 2024-04-12 DIAGNOSIS — Z79811 Long term (current) use of aromatase inhibitors: Secondary | ICD-10-CM | POA: Diagnosis not present

## 2024-04-12 DIAGNOSIS — Z170 Estrogen receptor positive status [ER+]: Secondary | ICD-10-CM | POA: Insufficient documentation

## 2024-04-12 DIAGNOSIS — R5383 Other fatigue: Secondary | ICD-10-CM | POA: Insufficient documentation

## 2024-04-12 DIAGNOSIS — Z79899 Other long term (current) drug therapy: Secondary | ICD-10-CM | POA: Insufficient documentation

## 2024-04-12 DIAGNOSIS — C50211 Malignant neoplasm of upper-inner quadrant of right female breast: Secondary | ICD-10-CM | POA: Insufficient documentation

## 2024-04-12 DIAGNOSIS — Z8049 Family history of malignant neoplasm of other genital organs: Secondary | ICD-10-CM | POA: Insufficient documentation

## 2024-04-12 DIAGNOSIS — Z95828 Presence of other vascular implants and grafts: Secondary | ICD-10-CM

## 2024-04-12 MED ORDER — GOSERELIN ACETATE 3.6 MG ~~LOC~~ IMPL
3.6000 mg | DRUG_IMPLANT | Freq: Once | SUBCUTANEOUS | Status: AC
  Administered 2024-04-12: 3.6 mg via SUBCUTANEOUS
  Filled 2024-04-12: qty 3.6

## 2024-05-10 ENCOUNTER — Inpatient Hospital Stay: Payer: 59 | Attending: Hematology and Oncology

## 2024-05-10 ENCOUNTER — Encounter: Payer: Self-pay | Admitting: *Deleted

## 2024-05-10 VITALS — BP 121/79 | HR 95 | Temp 98.3°F | Resp 16

## 2024-05-10 DIAGNOSIS — R5383 Other fatigue: Secondary | ICD-10-CM | POA: Insufficient documentation

## 2024-05-10 DIAGNOSIS — Z8049 Family history of malignant neoplasm of other genital organs: Secondary | ICD-10-CM | POA: Diagnosis not present

## 2024-05-10 DIAGNOSIS — C50211 Malignant neoplasm of upper-inner quadrant of right female breast: Secondary | ICD-10-CM | POA: Insufficient documentation

## 2024-05-10 DIAGNOSIS — R232 Flushing: Secondary | ICD-10-CM | POA: Insufficient documentation

## 2024-05-10 DIAGNOSIS — Z95828 Presence of other vascular implants and grafts: Secondary | ICD-10-CM

## 2024-05-10 DIAGNOSIS — Z79899 Other long term (current) drug therapy: Secondary | ICD-10-CM | POA: Insufficient documentation

## 2024-05-10 DIAGNOSIS — Z79811 Long term (current) use of aromatase inhibitors: Secondary | ICD-10-CM | POA: Diagnosis not present

## 2024-05-10 DIAGNOSIS — Z170 Estrogen receptor positive status [ER+]: Secondary | ICD-10-CM | POA: Insufficient documentation

## 2024-05-10 MED ORDER — GOSERELIN ACETATE 3.6 MG ~~LOC~~ IMPL
3.6000 mg | DRUG_IMPLANT | Freq: Once | SUBCUTANEOUS | Status: AC
  Administered 2024-05-10: 3.6 mg via SUBCUTANEOUS
  Filled 2024-05-10: qty 3.6

## 2024-05-10 NOTE — Progress Notes (Signed)
 Received call from pt stating she needs to be scheduled for her Mammogram.  MD has ordered contrast enhanced mammogram at the breast center.  RN transferred pt to the breast center to schedule.  Pt was unable to be scheduled due to certain schedulers being able to schedule this exam.  RN sent email to appropriate scheduling team with the breast center to schedule pt.

## 2024-05-10 NOTE — Patient Instructions (Signed)
Goserelin Implant What is this medication? GOSERELIN (GOE se rel in) treats prostate cancer and breast cancer. It works by decreasing levels of the hormones testosterone and estrogen in the body. This prevents prostate and breast cancer cells from spreading or growing. It may also be used to treat endometriosis. This is a condition where the tissue that lines the uterus grows outside the uterus. It works by decreasing the amount of estrogen your body makes, which reduces heavy bleeding and pain. It can also be used to help thin the lining of the uterus before a surgery used to prevent or reduce heavy periods. This medicine may be used for other purposes; ask your health care provider or pharmacist if you have questions. COMMON BRAND NAME(S): Zoladex, Zoladex 40-Month What should I tell my care team before I take this medication? They need to know if you have any of these conditions: Bone problems Diabetes Heart disease History of irregular heartbeat or rhythm An unusual or allergic reaction to goserelin, other medications, foods, dyes, or preservatives Pregnant or trying to get pregnant Breastfeeding How should I use this medication? This medication is injected under the skin. It is given by your care team in a hospital or clinic setting. Talk to your care team about the use of this medication in children. Special care may be needed. Overdosage: If you think you have taken too much of this medicine contact a poison control center or emergency room at once. NOTE: This medicine is only for you. Do not share this medicine with others. What if I miss a dose? Keep appointments for follow-up doses. It is important not to miss your dose. Call your care team if you are unable to keep an appointment. What may interact with this medication? Do not take this medication with any of the following: Cisapride Dronedarone Pimozide Thioridazine This medication may also interact with the following: Other  medications that cause heart rhythm changes This list may not describe all possible interactions. Give your health care provider a list of all the medicines, herbs, non-prescription drugs, or dietary supplements you use. Also tell them if you smoke, drink alcohol, or use illegal drugs. Some items may interact with your medicine. What should I watch for while using this medication? Visit your care team for regular checks on your progress. Your symptoms may appear to get worse during the first weeks of this therapy. Tell your care team if your symptoms do not start to get better or if they get worse after this time. Using this medication for a long time may weaken your bones. If you smoke or frequently drink alcohol you may increase your risk of bone loss. A family history of osteoporosis, chronic use of medications for seizures (convulsions), or corticosteroids can also increase your risk of bone loss. The risk of bone fractures may be increased. Talk to your care team about your bone health. This medication may increase blood sugar. The risk may be higher in patients who already have diabetes. Ask your care team what you can do to lower your risk of diabetes while taking this medication. This medication should stop regular monthly menstruation in women. Tell your care team if you continue to menstruate. Talk to your care team if you wish to become pregnant or think you might be pregnant. This medication can cause serious birth defects if taken during pregnancy or for 12 weeks after stopping treatment. Talk to your care team about reliable forms of contraception. Do not breastfeed while taking this  medication. This medication may cause infertility. Talk to your care team if you are concerned about your fertility. What side effects may I notice from receiving this medication? Side effects that you should report to your care team as soon as possible: Allergic reactions--skin rash, itching, hives, swelling  of the face, lips, tongue, or throat Change in the amount of urine Heart attack--pain or tightness in the chest, shoulders, arms, or jaw, nausea, shortness of breath, cold or clammy skin, feeling faint or lightheaded Heart rhythm changes--fast or irregular heartbeat, dizziness, feeling faint or lightheaded, chest pain, trouble breathing High blood sugar (hyperglycemia)--increased thirst or amount of urine, unusual weakness or fatigue, blurry vision High calcium level--increased thirst or amount of urine, nausea, vomiting, confusion, unusual weakness or fatigue, bone pain Pain, redness, irritation, or bruising at the injection site Severe back pain, numbness or weakness of the hands, arms, legs, or feet, loss of coordination, loss of bowel or bladder control Stroke--sudden numbness or weakness of the face, arm, or leg, trouble speaking, confusion, trouble walking, loss of balance or coordination, dizziness, severe headache, change in vision Swelling and pain of the tumor site or lymph nodes Trouble passing urine Side effects that usually do not require medical attention (report to your care team if they continue or are bothersome): Change in sex drive or performance Headache Hot flashes Rapid or extreme change in emotion or mood Sweating Swelling of the ankles, hands, or feet Unusual vaginal discharge, itching, or odor This list may not describe all possible side effects. Call your doctor for medical advice about side effects. You may report side effects to FDA at 1-800-FDA-1088. Where should I keep my medication? This medication is given in a hospital or clinic. It will not be stored at home. NOTE: This sheet is a summary. It may not cover all possible information. If you have questions about this medicine, talk to your doctor, pharmacist, or health care provider.  2023 Elsevier/Gold Standard (2022-03-02 00:00:00)

## 2024-05-20 ENCOUNTER — Encounter: Payer: Self-pay | Admitting: Hematology and Oncology

## 2024-05-21 ENCOUNTER — Other Ambulatory Visit: Payer: Self-pay | Admitting: Hematology and Oncology

## 2024-05-21 ENCOUNTER — Ambulatory Visit
Admission: RE | Admit: 2024-05-21 | Discharge: 2024-05-21 | Disposition: A | Discharge status: Home / Self Care | Source: Ambulatory Visit | Attending: Hematology and Oncology

## 2024-05-21 ENCOUNTER — Encounter: Payer: Self-pay | Admitting: Hematology and Oncology

## 2024-05-21 ENCOUNTER — Inpatient Hospital Stay
Admission: RE | Admit: 2024-05-21 | Discharge: 2024-05-21 | Discharge status: Home / Self Care | Source: Ambulatory Visit | Attending: Hematology and Oncology | Admitting: Hematology and Oncology

## 2024-05-21 DIAGNOSIS — N6489 Other specified disorders of breast: Secondary | ICD-10-CM

## 2024-05-21 DIAGNOSIS — C50211 Malignant neoplasm of upper-inner quadrant of right female breast: Secondary | ICD-10-CM

## 2024-05-21 DIAGNOSIS — Z853 Personal history of malignant neoplasm of breast: Secondary | ICD-10-CM | POA: Diagnosis not present

## 2024-05-21 DIAGNOSIS — Z08 Encounter for follow-up examination after completed treatment for malignant neoplasm: Secondary | ICD-10-CM | POA: Diagnosis not present

## 2024-05-21 MED ORDER — IOPAMIDOL (ISOVUE-370) INJECTION 76%
100.0000 mL | Freq: Once | INTRAVENOUS | Status: AC | PRN
  Administered 2024-05-21: 100 mL via INTRAVENOUS

## 2024-05-23 ENCOUNTER — Inpatient Hospital Stay
Admission: RE | Admit: 2024-05-23 | Discharge: 2024-05-23 | Discharge status: Home / Self Care | Source: Ambulatory Visit | Attending: Hematology and Oncology | Admitting: Hematology and Oncology

## 2024-05-23 ENCOUNTER — Ambulatory Visit
Admission: RE | Admit: 2024-05-23 | Discharge: 2024-05-23 | Disposition: A | Discharge status: Home / Self Care | Source: Ambulatory Visit | Attending: Hematology and Oncology | Admitting: Hematology and Oncology

## 2024-05-23 DIAGNOSIS — N6489 Other specified disorders of breast: Secondary | ICD-10-CM

## 2024-05-23 DIAGNOSIS — N6012 Diffuse cystic mastopathy of left breast: Secondary | ICD-10-CM | POA: Diagnosis not present

## 2024-05-23 DIAGNOSIS — C50211 Malignant neoplasm of upper-inner quadrant of right female breast: Secondary | ICD-10-CM

## 2024-05-23 HISTORY — PX: BREAST BIOPSY: SHX20

## 2024-05-24 LAB — SURGICAL PATHOLOGY

## 2024-05-31 ENCOUNTER — Encounter: Payer: Self-pay | Admitting: Hematology and Oncology

## 2024-06-05 ENCOUNTER — Encounter: Payer: Self-pay | Admitting: Hematology and Oncology

## 2024-06-07 ENCOUNTER — Inpatient Hospital Stay: Payer: 59 | Attending: Hematology and Oncology

## 2024-06-07 VITALS — BP 122/70 | HR 96 | Temp 98.3°F | Resp 19

## 2024-06-07 DIAGNOSIS — R232 Flushing: Secondary | ICD-10-CM | POA: Insufficient documentation

## 2024-06-07 DIAGNOSIS — Z79811 Long term (current) use of aromatase inhibitors: Secondary | ICD-10-CM | POA: Diagnosis not present

## 2024-06-07 DIAGNOSIS — R5383 Other fatigue: Secondary | ICD-10-CM | POA: Diagnosis not present

## 2024-06-07 DIAGNOSIS — C50211 Malignant neoplasm of upper-inner quadrant of right female breast: Secondary | ICD-10-CM | POA: Insufficient documentation

## 2024-06-07 DIAGNOSIS — Z8049 Family history of malignant neoplasm of other genital organs: Secondary | ICD-10-CM | POA: Insufficient documentation

## 2024-06-07 DIAGNOSIS — Z170 Estrogen receptor positive status [ER+]: Secondary | ICD-10-CM | POA: Diagnosis not present

## 2024-06-07 DIAGNOSIS — Z79899 Other long term (current) drug therapy: Secondary | ICD-10-CM | POA: Diagnosis not present

## 2024-06-07 DIAGNOSIS — Z95828 Presence of other vascular implants and grafts: Secondary | ICD-10-CM

## 2024-06-07 MED ORDER — GOSERELIN ACETATE 3.6 MG ~~LOC~~ IMPL
3.6000 mg | DRUG_IMPLANT | Freq: Once | SUBCUTANEOUS | Status: AC
Start: 2024-06-07 — End: 2024-06-07
  Administered 2024-06-07: 3.6 mg via SUBCUTANEOUS
  Filled 2024-06-07: qty 3.6

## 2024-07-05 ENCOUNTER — Inpatient Hospital Stay: Payer: 59 | Attending: Hematology and Oncology

## 2024-07-05 VITALS — BP 123/61 | HR 94 | Temp 98.0°F | Resp 18

## 2024-07-05 DIAGNOSIS — R5383 Other fatigue: Secondary | ICD-10-CM | POA: Insufficient documentation

## 2024-07-05 DIAGNOSIS — C50211 Malignant neoplasm of upper-inner quadrant of right female breast: Secondary | ICD-10-CM | POA: Diagnosis not present

## 2024-07-05 DIAGNOSIS — Z8049 Family history of malignant neoplasm of other genital organs: Secondary | ICD-10-CM | POA: Diagnosis not present

## 2024-07-05 DIAGNOSIS — R232 Flushing: Secondary | ICD-10-CM | POA: Insufficient documentation

## 2024-07-05 DIAGNOSIS — Z170 Estrogen receptor positive status [ER+]: Secondary | ICD-10-CM | POA: Insufficient documentation

## 2024-07-05 DIAGNOSIS — Z79899 Other long term (current) drug therapy: Secondary | ICD-10-CM | POA: Diagnosis not present

## 2024-07-05 DIAGNOSIS — Z79811 Long term (current) use of aromatase inhibitors: Secondary | ICD-10-CM | POA: Diagnosis not present

## 2024-07-05 DIAGNOSIS — Z95828 Presence of other vascular implants and grafts: Secondary | ICD-10-CM

## 2024-07-05 MED ORDER — GOSERELIN ACETATE 3.6 MG ~~LOC~~ IMPL
3.6000 mg | DRUG_IMPLANT | Freq: Once | SUBCUTANEOUS | Status: AC
  Administered 2024-07-05: 3.6 mg via SUBCUTANEOUS
  Filled 2024-07-05: qty 3.6

## 2024-08-01 ENCOUNTER — Other Ambulatory Visit: Payer: Self-pay | Admitting: *Deleted

## 2024-08-01 ENCOUNTER — Inpatient Hospital Stay: Payer: 59 | Attending: Hematology and Oncology

## 2024-08-01 ENCOUNTER — Inpatient Hospital Stay: Payer: 59

## 2024-08-01 ENCOUNTER — Inpatient Hospital Stay: Payer: 59 | Admitting: Hematology and Oncology

## 2024-08-01 VITALS — BP 119/64 | HR 83 | Temp 97.8°F | Resp 16 | Ht 61.0 in | Wt 139.1 lb

## 2024-08-01 DIAGNOSIS — F32A Depression, unspecified: Secondary | ICD-10-CM | POA: Insufficient documentation

## 2024-08-01 DIAGNOSIS — Z170 Estrogen receptor positive status [ER+]: Secondary | ICD-10-CM | POA: Diagnosis not present

## 2024-08-01 DIAGNOSIS — Z95828 Presence of other vascular implants and grafts: Secondary | ICD-10-CM

## 2024-08-01 DIAGNOSIS — R5383 Other fatigue: Secondary | ICD-10-CM | POA: Diagnosis not present

## 2024-08-01 DIAGNOSIS — C50211 Malignant neoplasm of upper-inner quadrant of right female breast: Secondary | ICD-10-CM

## 2024-08-01 DIAGNOSIS — Z79899 Other long term (current) drug therapy: Secondary | ICD-10-CM | POA: Diagnosis not present

## 2024-08-01 DIAGNOSIS — Z8049 Family history of malignant neoplasm of other genital organs: Secondary | ICD-10-CM | POA: Diagnosis not present

## 2024-08-01 DIAGNOSIS — R110 Nausea: Secondary | ICD-10-CM | POA: Insufficient documentation

## 2024-08-01 DIAGNOSIS — Z79811 Long term (current) use of aromatase inhibitors: Secondary | ICD-10-CM | POA: Insufficient documentation

## 2024-08-01 LAB — CBC WITH DIFFERENTIAL (CANCER CENTER ONLY)
Abs Immature Granulocytes: 0.02 K/uL (ref 0.00–0.07)
Basophils Absolute: 0.1 K/uL (ref 0.0–0.1)
Basophils Relative: 1 %
Eosinophils Absolute: 0.2 K/uL (ref 0.0–0.5)
Eosinophils Relative: 2 %
HCT: 37 % (ref 36.0–46.0)
Hemoglobin: 12.1 g/dL (ref 12.0–15.0)
Immature Granulocytes: 0 %
Lymphocytes Relative: 42 %
Lymphs Abs: 2.8 K/uL (ref 0.7–4.0)
MCH: 21.5 pg — ABNORMAL LOW (ref 26.0–34.0)
MCHC: 32.7 g/dL (ref 30.0–36.0)
MCV: 65.7 fL — ABNORMAL LOW (ref 80.0–100.0)
Monocytes Absolute: 0.5 K/uL (ref 0.1–1.0)
Monocytes Relative: 7 %
Neutro Abs: 3.1 K/uL (ref 1.7–7.7)
Neutrophils Relative %: 48 %
Platelet Count: 311 K/uL (ref 150–400)
RBC: 5.63 MIL/uL — ABNORMAL HIGH (ref 3.87–5.11)
RDW: 15.9 % — ABNORMAL HIGH (ref 11.5–15.5)
WBC Count: 6.6 K/uL (ref 4.0–10.5)
nRBC: 0 % (ref 0.0–0.2)

## 2024-08-01 LAB — CMP (CANCER CENTER ONLY)
ALT: 16 U/L (ref 0–44)
AST: 13 U/L — ABNORMAL LOW (ref 15–41)
Albumin: 4.2 g/dL (ref 3.5–5.0)
Alkaline Phosphatase: 86 U/L (ref 38–126)
Anion gap: 8 (ref 5–15)
BUN: 7 mg/dL (ref 6–20)
CO2: 29 mmol/L (ref 22–32)
Calcium: 9.8 mg/dL (ref 8.9–10.3)
Chloride: 103 mmol/L (ref 98–111)
Creatinine: 0.83 mg/dL (ref 0.44–1.00)
GFR, Estimated: >60 mL/min (ref >60)
Glucose, Bld: 135 mg/dL — ABNORMAL HIGH (ref 70–99)
Potassium: 4 mmol/L (ref 3.5–5.1)
Sodium: 140 mmol/L (ref 135–145)
Total Bilirubin: 0.2 mg/dL (ref 0.0–1.2)
Total Protein: 8 g/dL (ref 6.5–8.1)

## 2024-08-01 MED ORDER — GOSERELIN ACETATE 3.6 MG ~~LOC~~ IMPL
3.6000 mg | DRUG_IMPLANT | Freq: Once | SUBCUTANEOUS | Status: AC
  Administered 2024-08-01: 3.6 mg via SUBCUTANEOUS
  Filled 2024-08-01: qty 3.6

## 2024-08-01 NOTE — Assessment & Plan Note (Signed)
 01/10/2018:Palpable right breast mass with calcifications medial right breast at 1 o'clock position: 2.6 cm by ultrasound, 1 abnormal lymph node: Ultrasound biopsy of both the mass and the lymph node revealed grade 3 invasive ductal carcinoma ER 90%, PR 2%, Ki-67 70%, HER-2 negative ratio 0.96, T2 N1 stage II a AJCC 8 clinical stage   Recommendation: 1 Genetic testing: Negative.  Staging scans: No metastatic disease 2. neoadjuvant chemotherapy with dose dense Adriamycin  and Cytoxan  followed by Taxol  weekly x12 3.  Followed by breast conserving surgery on 07/13/2018: Pathologic complete response 4.  Followed by adjuvant radiation started 08/24/2018   5.  Followed by antiestrogen therapy and evaluation on Natalee clinical trial --------------------------------------------------------------------- Recommendation: Antiestrogen therapy with ovarian suppression and letrozole  started 09/25/2018    Toxicities: Patient is tolerating the treatment extremely well.  Does not have any significant hot flashes or arthralgias or myalgias.  Energy levels go up and down.   Breast cancer surveillance: 1.  Mammogram 04/28/2023: Benign breast density category C 2.  Breast MRI was recommended but not done because of cost reasons I recommended contrast-enhanced mammograms instead.   General Health Maintenance / Followup Plans -Continue monthly Zoladex  injections.     Return to clinic monthly for injections and I will see her back in 1 year Return to clinic in 1 year for follow-up

## 2024-08-01 NOTE — Progress Notes (Signed)
 Patient Care Team: Wilkie Majel RAMAN, FNP as PCP - General (Nurse Practitioner) Odean Potts, MD as Consulting Physician (Hematology and Oncology) Ethyl Lenis, MD as Consulting Physician (General Surgery) Shannon Agent, MD as Consulting Physician (Radiation Oncology)  DIAGNOSIS:  Encounter Diagnosis  Name Primary?   Malignant neoplasm of upper-inner quadrant of right breast in female, estrogen receptor positive (HCC) Yes    SUMMARY OF ONCOLOGIC HISTORY: Oncology History  Malignant neoplasm of upper-inner quadrant of right breast in female, estrogen receptor positive (HCC)  01/10/2018 Initial Diagnosis   Palpable right breast mass with calcifications medial right breast at 1 o'clock position: 2.6 cm by ultrasound, 1 abnormal lymph node: Ultrasound biopsy of both the mass and the lymph node revealed grade 3 invasive ductal carcinoma ER 90%, PR 2%, Ki-67 70%, HER-2 negative ratio 0.96, T2 N1 stage II a AJCC 8 clinical stage   01/17/2018 Cancer Staging   Staging form: Breast, AJCC 8th Edition - Clinical stage from 01/17/2018: Stage IIB (cT2, cN1, cM0, G3, ER+, PR+, HER2-) - Signed by Crawford Morna Pickle, NP on 06/20/2018   01/29/2018 - 06/12/2018 Neo-Adjuvant Chemotherapy   Dose dense Adriamycin  and Cytoxan  followed by Taxol  x12  Zoladex  started with chemo   02/06/2018 Genetic Testing   UPDATE: CDK4 c.[id number] (Silent) VUS was amended to likely benign due to a re-review of the evidence in light of new variant interpretation guidelines and/or new information.  Multi-Cancer panel (83 genes) @ [facility] - No pathogenic mutations detected Variant of Uncertain Significance in CDK4  Genes Analyzed: 83 genes on [facility]'s Multi-Cancer panel (ALK, APC, ATM, AXIN2, BAP1, BARD1, BLM, BMPR1A, BRCA1, BRCA2, BRIP1, CASR, CDC73, CDH1, CDK4, CDKN1B, CDKN1C, CDKN2A, CEBPA, CHEK2, CTNNA1, DICER1, DIS3L2, EGFR, EPCAM, FH, FLCN, GATA2, GPC3, GREM1, HOXB13, HRAS, KIT, MAX, MEN1, MET, MITF, MLH1, MSH2,  MSH3, MSH6, MUTYH, NBN, NF1, NF2, NTHL1, PALB2, PDGFRA, PHOX2B, PMS2, POLD1, POLE, POT1, PRKAR1A, PTCH1, PTEN, RAD50, RAD51C, RAD51D, RB1, RECQL4, RET, RUNX1, SDHA, SDHAF2, SDHB, SDHC, SDHD, SMAD4, SMARCA4, SMARCB1, SMARCE1, STK11, SUFU, TERC, TERT, TMEM127, TP53, TSC1, TSC2, VHL, WRN, WT1).   02/27/2018 Miscellaneous   Genetics negative   07/13/2018 Surgery   Right lumpectomy: Pathologic complete response 03 lymph nodes negative   08/23/2018 - 10/09/2018 Radiation Therapy   1. Right axilla; 1.8 Gy in 25 fractions for a total of 45 Gy                      2. Right breast; 1.8 Gy in 28 fractions for a total of 50.4 Gy                      3. Lumpectomy Boost; 2 Gy in 5 fractions for a total of 10 Gy   09/2018 -  Anti-estrogen oral therapy   Letrozole      CHIEF COMPLIANT: F/U of History of breast cancer on Zolodex and Letrozole   HISTORY OF PRESENT ILLNESS:  History of Present Illness Kim Griffin is a 28 year old female with breast cancer who presents for follow-up regarding her ongoing treatment with letrozole .  She has been on letrozole  for over six years and finds it effective. She experiences slight nausea and fatigue but no hot flashes or joint stiffness. Her last mammogram and biopsy on July 24th showed no need for further intervention. Her emotional state remains stable but could improve. Her family history includes a grandmother with cervical cancer who is experiencing neuropathy.     ALLERGIES:  has no known  allergies.  MEDICATIONS:  Current Outpatient Medications  Medication Sig Dispense Refill   ferrous sulfate 325 (65 FE) MG tablet Take 325 mg by mouth daily with breakfast.     goserelin (ZOLADEX ) 10.8 MG injection      ibuprofen  (ADVIL ) 600 MG tablet Take 1 tablet (600 mg total) by mouth every 6 (six) hours as needed. 30 tablet 0   letrozole  (FEMARA ) 2.5 MG tablet Take 1 tablet (2.5 mg total) by mouth daily. 90 tablet 3   meclizine  (ANTIVERT ) 25 MG tablet Take 1  tablet (25 mg total) by mouth 3 (three) times daily as needed for dizziness. 30 tablet 0   ondansetron  (ZOFRAN ) 4 MG tablet Take 1 tablet (4 mg total) by mouth every 8 (eight) hours as needed for nausea or vomiting. 20 tablet 0   No current facility-administered medications for this visit.    PHYSICAL EXAMINATION: ECOG PERFORMANCE STATUS: 1 - Symptomatic but completely ambulatory  Vitals:   08/01/24 0944  BP: 119/64  Pulse: 83  Resp: 16  Temp: 97.8 F (36.6 C)  SpO2: 100%   Filed Weights   08/01/24 0944  Weight: 139 lb 1.6 oz (63.1 kg)      LABORATORY DATA:  I have reviewed the data as listed    Latest Ref Rng & Units 04/20/2023    1:26 PM 01/31/2022    7:49 PM 06/12/2018    1:28 PM  CMP  Glucose 70 - 99 mg/dL 890  897  795   BUN 6 - 20 mg/dL 9  7  9    Creatinine 0.44 - 1.00 mg/dL 9.12  9.24  9.21   Sodium 135 - 145 mmol/L 140  138  139   Potassium 3.5 - 5.1 mmol/L 4.0  3.6  3.6   Chloride 98 - 111 mmol/L 101  102  102   CO2 22 - 32 mmol/L 27  27  25    Calcium 8.9 - 10.3 mg/dL 9.5  9.1  9.3   Total Protein 6.5 - 8.1 g/dL 7.8  7.5  6.9   Total Bilirubin 0.3 - 1.2 mg/dL 0.3  0.4  0.3   Alkaline Phos 38 - 126 U/L 74  75  69   AST 15 - 41 U/L 18  19  17    ALT 0 - 44 U/L 19  17  26      Lab Results  Component Value Date   WBC 6.6 08/01/2024   HGB 12.1 08/01/2024   HCT 37.0 08/01/2024   MCV 65.7 (L) 08/01/2024   PLT 311 08/01/2024   NEUTROABS 3.1 08/01/2024    ASSESSMENT & PLAN:  Malignant neoplasm of upper-inner quadrant of right breast in female, estrogen receptor positive (HCC) 01/10/2018:Palpable right breast mass with calcifications medial right breast at 1 o'clock position: 2.6 cm by ultrasound, 1 abnormal lymph node: Ultrasound biopsy of both the mass and the lymph node revealed grade 3 invasive ductal carcinoma ER 90%, PR 2%, Ki-67 70%, HER-2 negative ratio 0.96, T2 N1 stage II a AJCC 8 clinical stage   Recommendation: 1 Genetic testing: Negative.  Staging  scans: No metastatic disease 2. neoadjuvant chemotherapy with dose dense Adriamycin  and Cytoxan  followed by Taxol  weekly x12 3.  Followed by breast conserving surgery on 07/13/2018: Pathologic complete response 4.  Followed by adjuvant radiation started 08/24/2018   5.  Followed by antiestrogen therapy and evaluation on Natalee clinical trial --------------------------------------------------------------------- Recommendation: Antiestrogen therapy with ovarian suppression and letrozole  started 09/25/2018    Toxicities: Patient is  tolerating the treatment extremely well.  Does not have any significant hot flashes or arthralgias or myalgias.  Energy levels go up and down.   Breast cancer surveillance: 1.  Mammogram 04/28/2023: Benign breast density category C 2.  Breast MRI was recommended but not done because of cost reasons I recommended contrast-enhanced mammograms instead. 3. Guardant Reveal for MRD testing   General Health Maintenance / Followup Plans -Continue monthly Zoladex  injections.     Return to clinic monthly for injections and I will see her back in 1 year Return to clinic in 1 year for follow-up ------------------------------------- Assessment and Plan Assessment & Plan Estrogen receptor positive malignant neoplasm of upper-inner quadrant of right breast, status post treatment On letrozole  and goserelin for over six years. No recurrence per recent biopsy. Mild nausea and fatigue reported. - Continue letrozole  and goserelin. - Schedule Gardant blood test every six months. - Ensure follow-up for mammograms in July.  Depression Emotional state stable but suboptimal. Current medications may contribute to mood swings and depression. - Offer referral to a counselor if needed.      No orders of the defined types were placed in this encounter.  The patient has a good understanding of the overall plan. she agrees with it. she will call with any problems that may develop  before the next visit here. Total time spent: 30 mins including face to face time and time spent for planning, charting and co-ordination of care   Viinay K Lesa Vandall, MD 08/01/24

## 2024-08-02 ENCOUNTER — Telehealth: Payer: Self-pay

## 2024-08-02 NOTE — Telephone Encounter (Signed)
 Per md orders entered for Guardant Reveal and all supported documents faxed to [phone]. Faxed confirmation was received.

## 2024-08-08 ENCOUNTER — Telehealth: Payer: Self-pay | Admitting: Adult Health

## 2024-08-08 ENCOUNTER — Inpatient Hospital Stay

## 2024-08-08 NOTE — Telephone Encounter (Signed)
 Called pt regarding her appt and she aware

## 2024-08-21 ENCOUNTER — Encounter: Payer: Self-pay | Admitting: Hematology and Oncology

## 2024-08-26 ENCOUNTER — Telehealth: Payer: Self-pay

## 2024-08-26 NOTE — Telephone Encounter (Signed)
 S/w patient regarding questions about frequency of Zoladex  injection. Patient informed that upcoming injection for 10/30 is scheduled correctly as patient is to have injection every 4 weeks. Patient verbalized an understanding of the information.  Patient transferred to scheduling for additional assistance with updating time of day for future appointments.

## 2024-08-29 ENCOUNTER — Inpatient Hospital Stay

## 2024-08-29 VITALS — BP 118/68 | HR 95 | Resp 17

## 2024-08-29 DIAGNOSIS — Z170 Estrogen receptor positive status [ER+]: Secondary | ICD-10-CM | POA: Diagnosis not present

## 2024-08-29 DIAGNOSIS — C50211 Malignant neoplasm of upper-inner quadrant of right female breast: Secondary | ICD-10-CM | POA: Diagnosis not present

## 2024-08-29 DIAGNOSIS — R110 Nausea: Secondary | ICD-10-CM | POA: Diagnosis not present

## 2024-08-29 DIAGNOSIS — Z95828 Presence of other vascular implants and grafts: Secondary | ICD-10-CM

## 2024-08-29 DIAGNOSIS — Z8049 Family history of malignant neoplasm of other genital organs: Secondary | ICD-10-CM | POA: Diagnosis not present

## 2024-08-29 DIAGNOSIS — F32A Depression, unspecified: Secondary | ICD-10-CM | POA: Diagnosis not present

## 2024-08-29 DIAGNOSIS — Z79811 Long term (current) use of aromatase inhibitors: Secondary | ICD-10-CM | POA: Diagnosis not present

## 2024-08-29 DIAGNOSIS — Z79899 Other long term (current) drug therapy: Secondary | ICD-10-CM | POA: Diagnosis not present

## 2024-08-29 DIAGNOSIS — R5383 Other fatigue: Secondary | ICD-10-CM | POA: Diagnosis not present

## 2024-08-29 MED ORDER — GOSERELIN ACETATE 3.6 MG ~~LOC~~ IMPL
3.6000 mg | DRUG_IMPLANT | Freq: Once | SUBCUTANEOUS | Status: AC
  Administered 2024-08-29: 3.6 mg via SUBCUTANEOUS
  Filled 2024-08-29: qty 3.6

## 2024-09-12 ENCOUNTER — Inpatient Hospital Stay

## 2024-09-25 ENCOUNTER — Encounter: Payer: Self-pay | Admitting: Hematology and Oncology

## 2024-09-27 ENCOUNTER — Inpatient Hospital Stay: Attending: Hematology and Oncology

## 2024-09-27 VITALS — BP 111/65 | HR 83 | Temp 97.8°F | Resp 18

## 2024-09-27 DIAGNOSIS — R110 Nausea: Secondary | ICD-10-CM | POA: Diagnosis not present

## 2024-09-27 DIAGNOSIS — F32A Depression, unspecified: Secondary | ICD-10-CM | POA: Insufficient documentation

## 2024-09-27 DIAGNOSIS — R5383 Other fatigue: Secondary | ICD-10-CM | POA: Diagnosis not present

## 2024-09-27 DIAGNOSIS — Z79899 Other long term (current) drug therapy: Secondary | ICD-10-CM | POA: Insufficient documentation

## 2024-09-27 DIAGNOSIS — C50211 Malignant neoplasm of upper-inner quadrant of right female breast: Secondary | ICD-10-CM | POA: Diagnosis not present

## 2024-09-27 DIAGNOSIS — Z8049 Family history of malignant neoplasm of other genital organs: Secondary | ICD-10-CM | POA: Insufficient documentation

## 2024-09-27 DIAGNOSIS — Z170 Estrogen receptor positive status [ER+]: Secondary | ICD-10-CM | POA: Diagnosis not present

## 2024-09-27 DIAGNOSIS — Z79811 Long term (current) use of aromatase inhibitors: Secondary | ICD-10-CM | POA: Insufficient documentation

## 2024-09-27 DIAGNOSIS — Z95828 Presence of other vascular implants and grafts: Secondary | ICD-10-CM

## 2024-09-27 MED ORDER — GOSERELIN ACETATE 3.6 MG ~~LOC~~ IMPL
3.6000 mg | DRUG_IMPLANT | Freq: Once | SUBCUTANEOUS | Status: AC
  Administered 2024-09-27: 3.6 mg via SUBCUTANEOUS
  Filled 2024-09-27: qty 3.6

## 2024-10-10 ENCOUNTER — Inpatient Hospital Stay

## 2024-10-25 ENCOUNTER — Inpatient Hospital Stay: Attending: Hematology and Oncology

## 2024-10-25 VITALS — BP 116/72 | HR 100 | Resp 17

## 2024-10-25 DIAGNOSIS — R5383 Other fatigue: Secondary | ICD-10-CM | POA: Diagnosis not present

## 2024-10-25 DIAGNOSIS — F32A Depression, unspecified: Secondary | ICD-10-CM | POA: Insufficient documentation

## 2024-10-25 DIAGNOSIS — Z170 Estrogen receptor positive status [ER+]: Secondary | ICD-10-CM | POA: Diagnosis present

## 2024-10-25 DIAGNOSIS — Z79811 Long term (current) use of aromatase inhibitors: Secondary | ICD-10-CM | POA: Insufficient documentation

## 2024-10-25 DIAGNOSIS — Z79899 Other long term (current) drug therapy: Secondary | ICD-10-CM | POA: Insufficient documentation

## 2024-10-25 DIAGNOSIS — C50211 Malignant neoplasm of upper-inner quadrant of right female breast: Secondary | ICD-10-CM | POA: Insufficient documentation

## 2024-10-25 DIAGNOSIS — Z8049 Family history of malignant neoplasm of other genital organs: Secondary | ICD-10-CM | POA: Diagnosis not present

## 2024-10-25 DIAGNOSIS — R110 Nausea: Secondary | ICD-10-CM | POA: Insufficient documentation

## 2024-10-25 DIAGNOSIS — Z95828 Presence of other vascular implants and grafts: Secondary | ICD-10-CM

## 2024-10-25 MED ORDER — GOSERELIN ACETATE 3.6 MG ~~LOC~~ IMPL
3.6000 mg | DRUG_IMPLANT | Freq: Once | SUBCUTANEOUS | Status: AC
  Administered 2024-10-25: 3.6 mg via SUBCUTANEOUS
  Filled 2024-10-25: qty 3.6

## 2024-11-14 ENCOUNTER — Inpatient Hospital Stay

## 2024-11-21 ENCOUNTER — Inpatient Hospital Stay: Attending: Hematology and Oncology

## 2024-11-21 VITALS — BP 119/74 | HR 95 | Temp 98.6°F | Resp 16

## 2024-11-21 DIAGNOSIS — C50211 Malignant neoplasm of upper-inner quadrant of right female breast: Secondary | ICD-10-CM | POA: Diagnosis present

## 2024-11-21 DIAGNOSIS — Z79811 Long term (current) use of aromatase inhibitors: Secondary | ICD-10-CM | POA: Diagnosis not present

## 2024-11-21 DIAGNOSIS — Z79899 Other long term (current) drug therapy: Secondary | ICD-10-CM | POA: Insufficient documentation

## 2024-11-21 DIAGNOSIS — Z95828 Presence of other vascular implants and grafts: Secondary | ICD-10-CM

## 2024-11-21 DIAGNOSIS — Z170 Estrogen receptor positive status [ER+]: Secondary | ICD-10-CM | POA: Insufficient documentation

## 2024-11-21 MED ORDER — GOSERELIN ACETATE 3.6 MG ~~LOC~~ IMPL
3.6000 mg | DRUG_IMPLANT | Freq: Once | SUBCUTANEOUS | Status: AC
  Administered 2024-11-21: 3.6 mg via SUBCUTANEOUS
  Filled 2024-11-21: qty 3.6

## 2024-11-22 ENCOUNTER — Inpatient Hospital Stay

## 2024-12-19 ENCOUNTER — Inpatient Hospital Stay

## 2024-12-19 ENCOUNTER — Inpatient Hospital Stay: Attending: Hematology and Oncology

## 2024-12-20 ENCOUNTER — Inpatient Hospital Stay

## 2025-01-16 ENCOUNTER — Inpatient Hospital Stay

## 2025-01-16 ENCOUNTER — Inpatient Hospital Stay: Attending: Hematology and Oncology

## 2025-01-17 ENCOUNTER — Inpatient Hospital Stay

## 2025-02-13 ENCOUNTER — Inpatient Hospital Stay: Attending: Hematology and Oncology

## 2025-02-14 ENCOUNTER — Ambulatory Visit

## 2025-02-20 ENCOUNTER — Inpatient Hospital Stay

## 2025-03-13 ENCOUNTER — Inpatient Hospital Stay: Attending: Hematology and Oncology

## 2025-03-14 ENCOUNTER — Inpatient Hospital Stay

## 2025-03-20 ENCOUNTER — Inpatient Hospital Stay

## 2025-04-10 ENCOUNTER — Inpatient Hospital Stay: Attending: Hematology and Oncology

## 2025-04-11 ENCOUNTER — Ambulatory Visit

## 2025-04-24 ENCOUNTER — Inpatient Hospital Stay

## 2025-05-08 ENCOUNTER — Inpatient Hospital Stay: Attending: Hematology and Oncology

## 2025-05-09 ENCOUNTER — Inpatient Hospital Stay

## 2025-05-22 ENCOUNTER — Inpatient Hospital Stay

## 2025-06-05 ENCOUNTER — Inpatient Hospital Stay: Attending: Hematology and Oncology | Admitting: Hematology and Oncology

## 2025-06-26 ENCOUNTER — Inpatient Hospital Stay

## 2025-07-31 ENCOUNTER — Inpatient Hospital Stay: Admitting: Hematology and Oncology
